# Patient Record
Sex: Female | Born: 1995 | Race: White | Hispanic: No | Marital: Single | Smoking: Never smoker
Health system: Southern US, Community
[De-identification: ages and names within clinical notes are randomized; demographics above are authoritative.]

## PROBLEM LIST (undated history)

## (undated) ENCOUNTER — Ambulatory Visit: Payer: MEDICAID

## (undated) ENCOUNTER — Ambulatory Visit

## (undated) ENCOUNTER — Encounter

## (undated) ENCOUNTER — Encounter: Attending: Medical Oncology | Primary: Medical Oncology

## (undated) ENCOUNTER — Telehealth: Attending: Psychiatry | Primary: Psychiatry

## (undated) ENCOUNTER — Telehealth

## (undated) ENCOUNTER — Encounter: Attending: Adult Health | Primary: Adult Health

## (undated) ENCOUNTER — Ambulatory Visit: Attending: Adult Health | Primary: Adult Health

## (undated) ENCOUNTER — Telehealth: Attending: Pharmacist | Primary: Pharmacist

## (undated) ENCOUNTER — Ambulatory Visit: Attending: Radiation Oncology | Primary: Radiation Oncology

## (undated) ENCOUNTER — Encounter: Attending: Psychiatry | Primary: Psychiatry

## (undated) ENCOUNTER — Telehealth: Attending: Clinical | Primary: Clinical

## (undated) ENCOUNTER — Telehealth: Attending: Adult Health | Primary: Adult Health

## (undated) ENCOUNTER — Encounter: Payer: MEDICAID | Attending: Adult Health | Primary: Adult Health

## (undated) ENCOUNTER — Telehealth
Attending: Student in an Organized Health Care Education/Training Program | Primary: Student in an Organized Health Care Education/Training Program

## (undated) ENCOUNTER — Encounter: Attending: Pharmacist | Primary: Pharmacist

## (undated) ENCOUNTER — Telehealth: Attending: Medical Oncology | Primary: Medical Oncology

## (undated) ENCOUNTER — Ambulatory Visit: Payer: MEDICAID | Attending: Adult Health | Primary: Adult Health

## (undated) ENCOUNTER — Telehealth: Attending: Pulmonary Disease | Primary: Pulmonary Disease

## (undated) ENCOUNTER — Encounter: Attending: Internal Medicine | Primary: Internal Medicine

## (undated) ENCOUNTER — Ambulatory Visit
Attending: Rehabilitative and Restorative Service Providers" | Primary: Rehabilitative and Restorative Service Providers"

## (undated) ENCOUNTER — Telehealth: Attending: Critical Care Medicine | Primary: Critical Care Medicine

## (undated) ENCOUNTER — Ambulatory Visit
Attending: Student in an Organized Health Care Education/Training Program | Primary: Student in an Organized Health Care Education/Training Program

## (undated) ENCOUNTER — Ambulatory Visit: Attending: Medical Oncology | Primary: Medical Oncology

## (undated) ENCOUNTER — Encounter: Payer: MEDICAID | Attending: Radiation Oncology | Primary: Radiation Oncology

## (undated) ENCOUNTER — Inpatient Hospital Stay

## (undated) ENCOUNTER — Encounter: Attending: Clinical | Primary: Clinical

## (undated) ENCOUNTER — Encounter
Attending: Student in an Organized Health Care Education/Training Program | Primary: Student in an Organized Health Care Education/Training Program

## (undated) ENCOUNTER — Ambulatory Visit: Attending: Obstetrics & Gynecology | Primary: Obstetrics & Gynecology

## (undated) ENCOUNTER — Encounter: Attending: Critical Care Medicine | Primary: Critical Care Medicine

## (undated) ENCOUNTER — Ambulatory Visit: Attending: Psychiatry | Primary: Psychiatry

## (undated) ENCOUNTER — Telehealth: Attending: Internal Medicine | Primary: Internal Medicine

## (undated) ENCOUNTER — Ambulatory Visit: Payer: MEDICAID | Attending: Radiation Oncology | Primary: Radiation Oncology

## (undated) ENCOUNTER — Encounter: Attending: Hematology & Oncology | Primary: Hematology & Oncology

## (undated) ENCOUNTER — Encounter: Attending: Radiation Oncology | Primary: Radiation Oncology

## (undated) ENCOUNTER — Encounter: Payer: MEDICAID | Attending: Obstetrics & Gynecology | Primary: Obstetrics & Gynecology

## (undated) ENCOUNTER — Telehealth: Attending: Radiation Oncology | Primary: Radiation Oncology

## (undated) ENCOUNTER — Telehealth: Attending: Medical | Primary: Medical

## (undated) DIAGNOSIS — F410 Panic disorder [episodic paroxysmal anxiety] without agoraphobia: Secondary | ICD-10-CM

## (undated) DIAGNOSIS — T1491XA Suicide attempt, initial encounter: Secondary | ICD-10-CM

## (undated) DIAGNOSIS — C50919 Malignant neoplasm of unspecified site of unspecified female breast: Secondary | ICD-10-CM

## (undated) HISTORY — PX: NO PAST SURGERIES: SHX2092

## (undated) HISTORY — PX: MASTECTOMY: SHX3

## (undated) HISTORY — DX: Malignant neoplasm of unspecified site of unspecified female breast: C50.919

## (undated) HISTORY — DX: Suicide attempt, initial encounter: T14.91XA

---
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
## 2014-03-11 ENCOUNTER — Ambulatory Visit: Payer: Self-pay | Admitting: Physician Assistant

## 2015-03-15 ENCOUNTER — Ambulatory Visit
Admission: EM | Admit: 2015-03-15 | Discharge: 2015-03-15 | Disposition: A | Discharge status: Home / Self Care | Payer: Self-pay | Attending: Family Medicine | Admitting: Family Medicine

## 2015-03-15 ENCOUNTER — Encounter: Payer: Self-pay | Admitting: Emergency Medicine

## 2015-03-15 DIAGNOSIS — Z113 Encounter for screening for infections with a predominantly sexual mode of transmission: Secondary | ICD-10-CM

## 2015-03-15 DIAGNOSIS — J0100 Acute maxillary sinusitis, unspecified: Secondary | ICD-10-CM

## 2015-03-15 LAB — CHLAMYDIA/NGC RT PCR (ARMC ONLY)
Chlamydia Tr: NOT DETECTED
N GONORRHOEAE: NOT DETECTED

## 2015-03-15 LAB — RAPID STREP SCREEN (MED CTR MEBANE ONLY): STREPTOCOCCUS, GROUP A SCREEN (DIRECT): NEGATIVE

## 2015-03-15 MED ORDER — AZITHROMYCIN 250 MG PO TABS: ORAL_TABLET | ORAL | Status: DC

## 2015-03-15 NOTE — ED Notes (Signed)
HIV Consent form signed by patient and sent to be scan into chart.

## 2015-03-15 NOTE — ED Notes (Signed)
Patient c/o sore throat, cough, and runny nose for 2 weeks.

## 2015-03-15 NOTE — ED Provider Notes (Addendum)
CSN: [id number]     Arrival date & time 03/15/15  1232 History   First MD Initiated Contact with Patient 03/15/15 1319     Chief Complaint  Patient presents with  . Cough   (Consider location/radiation/quality/duration/timing/severity/associated sxs/prior Treatment) HPI Comments: Patient also requesting STD screening, although she denies any symptoms.   Patient is a 20 y.o. female presenting with URI. The history is provided by the patient.  URI Presenting symptoms: congestion, cough, facial pain, fatigue and sore throat   Severity:  Moderate Onset quality:  Sudden Duration:  2 weeks Timing:  Constant Progression:  Unchanged Chronicity:  New Ineffective treatments:  OTC medications Associated symptoms: headaches, sinus pain and swollen glands     History reviewed. No pertinent past medical history. History reviewed. No pertinent past surgical history. History reviewed. No pertinent family history. Social History  Substance Use Topics  . Smoking status: Never Smoker   . Smokeless tobacco: None  . Alcohol Use: No   OB History    No data available     Review of Systems  Constitutional: Positive for fatigue.  HENT: Positive for congestion and sore throat.   Respiratory: Positive for cough.   Neurological: Positive for headaches.    Allergies  Amoxicillin  Home Medications   Prior to Admission medications   Medication Sig Start Date End Date Taking? Authorizing Provider  azithromycin (ZITHROMAX Z-PAK) 250 MG tablet 2 tabs po once day 1, then 1 tab po qd for next 4 days 03/15/15   Norval Gable, MD   Meds Ordered and Administered this Visit  Medications - No data to display  BP 142/84 mmHg  Pulse 97  Temp(Src) 98.2 F (36.8 C) (Tympanic)  Resp 16  Ht 5\' 9"  (1.753 m)  Wt 235 lb (106.595 kg)  BMI 34.69 kg/m2  SpO2 100%  LMP 02/25/2015 (Exact Date) No data found.   Physical Exam  Constitutional: She appears well-developed and well-nourished. No distress.   HENT:  Head: Normocephalic and atraumatic.  Right Ear: Tympanic membrane, external ear and ear canal normal.  Left Ear: Tympanic membrane, external ear and ear canal normal.  Nose: Mucosal edema and rhinorrhea present. No nose lacerations, sinus tenderness, nasal deformity, septal deviation or nasal septal hematoma. No epistaxis.  No foreign bodies. Right sinus exhibits maxillary sinus tenderness and frontal sinus tenderness. Left sinus exhibits maxillary sinus tenderness and frontal sinus tenderness.  Mouth/Throat: Uvula is midline, oropharynx is clear and moist and mucous membranes are normal. No oropharyngeal exudate.  Eyes: Conjunctivae and EOM are normal. Pupils are equal, round, and reactive to light. Right eye exhibits no discharge. Left eye exhibits no discharge. No scleral icterus.  Neck: Normal range of motion. Neck supple. No thyromegaly present.  Cardiovascular: Normal rate, regular rhythm and normal heart sounds.   Pulmonary/Chest: Effort normal and breath sounds normal. No respiratory distress. She has no wheezes. She has no rales.  Lymphadenopathy:    She has no cervical adenopathy.  Skin: She is not diaphoretic.  Nursing note and vitals reviewed.   ED Course  Procedures (including critical care time)  Labs Review Labs Reviewed  RAPID STREP SCREEN (NOT AT [facility])  CULTURE, GROUP A STREP ([place])  CHLAMYDIA/NGC RT PCR ([facility] ONLY)  HIV ANTIBODY (ROUTINE TESTING)  RPR    Imaging Review No results found.   Visual Acuity Review  Right Eye Distance:   Left Eye Distance:   Bilateral Distance:    Right Eye Near:   Left Eye Near:  Bilateral Near:         MDM   1. Acute maxillary sinusitis, recurrence not specified   2. Screening for STD (sexually transmitted disease)    Discharge Medication List as of 03/15/2015  1:51 PM    START taking these medications   Details  azithromycin (ZITHROMAX Z-PAK) 250 MG tablet 2 tabs po once day 1, then 1 tab po qd  for next 4 days, Normal       1. diagnosis reviewed with patient 2. rx as per orders above; reviewed possible side effects, interactions, risks and benefits  3. Recommend supportive treatment with increased fluids; otc flonase; otc analgesics prn 4. Check labs for STD screening per patient request 5.  Follow-up prn if symptoms worsen or don't improve    Norval Gable, MD 03/15/15 [place], MD 03/15/15 1544

## 2015-03-16 LAB — HIV ANTIBODY (ROUTINE TESTING W REFLEX): HIV Screen 4th Generation wRfx: NONREACTIVE

## 2015-03-16 LAB — RPR: RPR: NONREACTIVE

## 2015-03-17 LAB — CULTURE, GROUP A STREP (THRC)

## 2017-10-29 ENCOUNTER — Other Ambulatory Visit: Payer: Self-pay

## 2017-10-29 ENCOUNTER — Encounter: Payer: Self-pay | Admitting: Emergency Medicine

## 2017-10-29 ENCOUNTER — Ambulatory Visit
Admission: EM | Admit: 2017-10-29 | Discharge: 2017-10-29 | Disposition: A | Discharge status: Home / Self Care | Payer: Self-pay | Attending: Family Medicine | Admitting: Family Medicine

## 2017-10-29 DIAGNOSIS — K047 Periapical abscess without sinus: Secondary | ICD-10-CM

## 2017-10-29 HISTORY — DX: Panic disorder (episodic paroxysmal anxiety): F41.0

## 2017-10-29 MED ORDER — CLINDAMYCIN HCL 150 MG PO CAPS: 450.0000 mg | ORAL_CAPSULE | Freq: Three times a day (TID) | ORAL | 0 refills | Status: DC

## 2017-10-29 NOTE — Discharge Instructions (Signed)
Use ibuprofen 400 mg combined with Tylenol 500 mg taken every 6 hours for pain.   Apply  Cool compresses on your cheek as necessary for pain.  Apply 20 minutes out of every 2 hours 4-5 times daily.  Use salt water rinse several times daily.  Keep appointment with your dentist tomorrow.

## 2017-10-29 NOTE — ED Provider Notes (Signed)
[facility]    CSN: [id number] Arrival date & time: 10/29/17  1435     History   Chief Complaint Chief Complaint  Patient presents with  . Oral Pain    HPI Melissa [name] is a 22 y.o. female.   HPI  -year-old female presents with pain on the right side lower back molar.  She states that she has swelling of the gum.  This started about 5 days ago but has not improved.  She said no fever or chills.  Is able to open her mouth fully.  States that she had similar problem with her wisdom tooth before.  Does have an appointment with a dentist tomorrow morning.  Feels there is a flap on her gum that when she is able to lift it with her tongue almost taste like blood.  The pain also involves her ear and her jaw.  Been using 400 mg of ibuprofen 3 times daily but this does not seem to be helping the pain much.  States her mother insisted she come in today.          Past Medical History:  Diagnosis Date  . Panic disorder     There are no active problems to display for this patient.   Past Surgical History:  Procedure Laterality Date  . NO PAST SURGERIES      OB History   None      Home Medications    Prior to Admission medications   Medication Sig Start Date End Date Taking? Authorizing Provider  sertraline (ZOLOFT) 50 MG tablet Take 100 mg by mouth daily. 09/10/17  Yes [provider]  clindamycin (CLEOCIN) 150 MG capsule Take 3 capsules (450 mg total) by mouth 3 (three) times daily. 10/29/17   Lorin Picket, PA-C    Family History Family History  Problem Relation Age of Onset  . Healthy Mother   . Healthy Father     Social History Social History   Tobacco Use  . Smoking status: Never Smoker  . Smokeless tobacco: Never Used  Substance Use Topics  . Alcohol use: No  . Drug use: Never     Allergies   Amoxicillin   Review of Systems Review of Systems  Constitutional: Positive for activity change and appetite change. Negative for  chills, fatigue and fever.  HENT: Positive for dental problem.   All other systems reviewed and are negative.    Physical Exam Triage Vital Signs ED Triage Vitals  Enc Vitals Group     BP 10/29/17 1510 132/80     Pulse Rate 10/29/17 1510 80     Resp 10/29/17 1510 16     Temp 10/29/17 1510 98.4 F (36.9 C)     Temp Source 10/29/17 1510 Oral     SpO2 10/29/17 1510 99 %     Weight 10/29/17 1508 270 lb (122.5 kg)     Height 10/29/17 1508 5\' 8"  (1.727 m)     Head Circumference --      Peak Flow --      Pain Score 10/29/17 1508 4     Pain Loc --      Pain Edu? --      Excl. in [place]? --    No data found.  Updated Vital Signs BP 132/80 (BP Location: Left Arm)   Pulse 80   Temp 98.4 F (36.9 C) (Oral)   Resp 16   Ht 5\' 8"  (1.727 m)   Wt 270 lb (  122.5 kg)   LMP 10/23/2017   SpO2 99%   BMI 41.05 kg/m   Visual Acuity Right Eye Distance:   Left Eye Distance:   Bilateral Distance:    Right Eye Near:   Left Eye Near:    Bilateral Near:     Physical Exam  Constitutional: She is oriented to person, place, and time. She appears well-developed and well-nourished. No distress.  HENT:  Head: Normocephalic.  Right Ear: External ear normal.  Left Ear: External ear normal.  Nose: Nose normal.  Mouth/Throat: Oropharynx is clear and moist. No oropharyngeal exudate.    Examination of the teeth and mouth shows a partially erupted wisdom tooth on the lower right bottom.,  Although not swollen, gum is very tender to palpation.  Eyes: Pupils are equal, round, and reactive to light. Right eye exhibits no discharge. Left eye exhibits no discharge.  Neck: Normal range of motion.  Musculoskeletal: Normal range of motion.  Lymphadenopathy:    She has no cervical adenopathy.  Neurological: She is alert and oriented to person, place, and time.  Skin: Skin is warm and dry. She is not diaphoretic.  Psychiatric: She has a normal mood and affect. Her behavior is normal. Judgment and thought  content normal.  Nursing note and vitals reviewed.    UC Treatments / Results  Labs (all labs ordered are listed, but only abnormal results are displayed) Labs Reviewed - No data to display  EKG None  Radiology No results found.  Procedures Procedures (including critical care time)  Medications Ordered in UC Medications - No data to display  Initial Impression / Assessment and Plan / UC Course  I have reviewed the triage vital signs and the nursing notes.  Pertinent labs & imaging results that were available during my care of the patient were reviewed by me and considered in my medical decision making (see chart for details).     Plan: 1. Test/x-ray results and diagnosis reviewed with patient 2. rx as per orders; risks, benefits, potential side effects reviewed with patient 3. Recommend supportive treatment with compresses to the cheek as necessary for comfort and pain control.  Recommending the swishing and spitting with salt water gargles as necessary.  Because of the penicillin allergy we will start her on clindamycin.  Recommend adding Tylenol 500 mg to ibuprofen 400 mg every 6 hours as necessary for pain.  Follow-up with the dentist tomorrow 4. F/u prn if symptoms worsen or don't improve  Final Clinical Impressions(s) / UC Diagnoses   Final diagnoses:  Dental infection     Discharge Instructions     Use ibuprofen 400 mg combined with Tylenol 500 mg taken every 6 hours for pain.   Apply  Cool compresses on your cheek as necessary for pain.  Apply 20 minutes out of every 2 hours 4-5 times daily.  Use salt water rinse several times daily.  Keep appointment with your dentist tomorrow.   ED Prescriptions    Medication Sig Dispense Auth. Provider   clindamycin (CLEOCIN) 150 MG capsule Take 3 capsules (450 mg total) by mouth 3 (three) times daily. 63 capsule Lorin Picket, PA-C     Controlled Substance Prescriptions [place] Controlled Substance Registry consulted?  Not Applicable   Lorin Picket, PA-C 10/29/17 1622

## 2017-10-29 NOTE — ED Triage Notes (Signed)
Pt c/o mouth pain on the right, bottom side. Swelling of the gum. Started 5 days ago.

## 2017-12-02 ENCOUNTER — Encounter: Admit: 2017-12-02 | Discharge: 2017-12-03 | Disposition: A | Discharge status: Home / Self Care | Payer: MEDICAID

## 2017-12-02 DIAGNOSIS — N6321 Unspecified lump in the left breast, upper outer quadrant: Principal | ICD-10-CM

## 2017-12-03 ENCOUNTER — Ambulatory Visit: Admit: 2017-12-03 | Discharge: 2017-12-04

## 2017-12-03 DIAGNOSIS — N6320 Unspecified lump in the left breast, unspecified quadrant: Principal | ICD-10-CM

## 2017-12-03 DIAGNOSIS — N644 Mastodynia: Secondary | ICD-10-CM

## 2017-12-03 DIAGNOSIS — N63 Unspecified lump in unspecified breast: Principal | ICD-10-CM

## 2017-12-11 ENCOUNTER — Ambulatory Visit: Admit: 2017-12-11 | Discharge: 2017-12-12 | Attending: Medical Oncology | Primary: Medical Oncology

## 2017-12-11 ENCOUNTER — Ambulatory Visit: Admit: 2017-12-11 | Discharge: 2017-12-12

## 2017-12-11 ENCOUNTER — Ambulatory Visit: Admit: 2017-12-11 | Discharge: 2017-12-12 | Attending: MS" | Primary: MS"

## 2017-12-11 DIAGNOSIS — C50412 Malignant neoplasm of upper-outer quadrant of left female breast: Principal | ICD-10-CM

## 2017-12-11 DIAGNOSIS — C50912 Malignant neoplasm of unspecified site of left female breast: Secondary | ICD-10-CM

## 2017-12-11 DIAGNOSIS — Z171 Estrogen receptor negative status [ER-]: Secondary | ICD-10-CM

## 2017-12-11 DIAGNOSIS — Z1379 Encounter for other screening for genetic and chromosomal anomalies: Secondary | ICD-10-CM

## 2017-12-13 ENCOUNTER — Ambulatory Visit: Admit: 2017-12-13 | Discharge: 2017-12-26 | Attending: Radiation Oncology | Primary: Radiation Oncology

## 2017-12-13 DIAGNOSIS — Z171 Estrogen receptor negative status [ER-]: Secondary | ICD-10-CM

## 2017-12-13 DIAGNOSIS — C50412 Malignant neoplasm of upper-outer quadrant of left female breast: Secondary | ICD-10-CM

## 2017-12-13 DIAGNOSIS — C50912 Malignant neoplasm of unspecified site of left female breast: Principal | ICD-10-CM

## 2017-12-16 ENCOUNTER — Ambulatory Visit: Admit: 2017-12-16 | Discharge: 2017-12-29

## 2017-12-16 ENCOUNTER — Ambulatory Visit: Admit: 2017-12-16 | Discharge: 2017-12-17

## 2017-12-16 DIAGNOSIS — C50912 Malignant neoplasm of unspecified site of left female breast: Principal | ICD-10-CM

## 2017-12-18 ENCOUNTER — Ambulatory Visit: Admit: 2017-12-18 | Discharge: 2017-12-18

## 2017-12-18 DIAGNOSIS — C50912 Malignant neoplasm of unspecified site of left female breast: Principal | ICD-10-CM

## 2017-12-20 ENCOUNTER — Ambulatory Visit: Admit: 2017-12-20 | Discharge: 2017-12-20 | Attending: Medical Oncology | Primary: Medical Oncology

## 2017-12-20 ENCOUNTER — Other Ambulatory Visit: Admit: 2017-12-20 | Discharge: 2017-12-20

## 2017-12-20 DIAGNOSIS — Z171 Estrogen receptor negative status [ER-]: Secondary | ICD-10-CM

## 2017-12-20 DIAGNOSIS — C50412 Malignant neoplasm of upper-outer quadrant of left female breast: Principal | ICD-10-CM

## 2017-12-20 DIAGNOSIS — C50912 Malignant neoplasm of unspecified site of left female breast: Principal | ICD-10-CM

## 2017-12-20 MED ORDER — PROCHLORPERAZINE MALEATE 10 MG TABLET
ORAL_TABLET | Freq: Four times a day (QID) | ORAL | 2 refills | 0.00000 days | Status: CP | PRN
Start: 2017-12-20 — End: 2018-01-08
  Filled 2017-12-24: qty 30, 8d supply, fill #0

## 2017-12-20 MED ORDER — ONDANSETRON HCL 4 MG TABLET
ORAL_TABLET | Freq: Three times a day (TID) | ORAL | 2 refills | 0.00000 days | Status: CP | PRN
Start: 2017-12-20 — End: 2018-01-08
  Filled 2017-12-24: qty 30, 5d supply, fill #0

## 2017-12-20 MED ORDER — DEXAMETHASONE 4 MG TABLET
ORAL_TABLET | Freq: Every day | ORAL | 0 refills | 0 days | Status: CP
Start: 2017-12-20 — End: 2018-01-21
  Filled 2017-12-24: qty 6, 14d supply, fill #0

## 2017-12-24 ENCOUNTER — Other Ambulatory Visit: Admit: 2017-12-24 | Discharge: 2017-12-25

## 2017-12-24 ENCOUNTER — Ambulatory Visit: Admit: 2017-12-24 | Discharge: 2017-12-25

## 2017-12-24 ENCOUNTER — Ambulatory Visit: Admit: 2017-12-24 | Discharge: 2017-12-25 | Attending: Adult Health | Primary: Adult Health

## 2017-12-24 DIAGNOSIS — C50412 Malignant neoplasm of upper-outer quadrant of left female breast: Principal | ICD-10-CM

## 2017-12-24 DIAGNOSIS — Z171 Estrogen receptor negative status [ER-]: Secondary | ICD-10-CM

## 2017-12-24 MED ORDER — LIDOCAINE-PRILOCAINE 2.5 %-2.5 % TOPICAL CREAM
1 refills | 0 days | Status: SS
Start: 2017-12-24 — End: 2018-05-30

## 2017-12-24 MED FILL — ONDANSETRON HCL 4 MG TABLET: 5 days supply | Qty: 30 | Fill #0 | Status: AC

## 2017-12-24 MED FILL — DEXAMETHASONE 4 MG TABLET: 14 days supply | Qty: 6 | Fill #0 | Status: AC

## 2017-12-24 MED FILL — PROCHLORPERAZINE MALEATE 10 MG TABLET: 8 days supply | Qty: 30 | Fill #0 | Status: AC

## 2017-12-25 ENCOUNTER — Ambulatory Visit: Admit: 2017-12-25 | Discharge: 2017-12-26

## 2017-12-25 DIAGNOSIS — Z171 Estrogen receptor negative status [ER-]: Secondary | ICD-10-CM

## 2017-12-25 DIAGNOSIS — C50412 Malignant neoplasm of upper-outer quadrant of left female breast: Principal | ICD-10-CM

## 2018-01-07 ENCOUNTER — Ambulatory Visit: Admit: 2018-01-07 | Discharge: 2018-01-07 | Attending: Adult Health | Primary: Adult Health

## 2018-01-07 ENCOUNTER — Other Ambulatory Visit: Admit: 2018-01-07 | Discharge: 2018-01-07

## 2018-01-07 ENCOUNTER — Ambulatory Visit: Admit: 2018-01-07 | Discharge: 2018-01-07

## 2018-01-07 DIAGNOSIS — C50412 Malignant neoplasm of upper-outer quadrant of left female breast: Principal | ICD-10-CM

## 2018-01-07 DIAGNOSIS — Z171 Estrogen receptor negative status [ER-]: Secondary | ICD-10-CM

## 2018-01-07 MED ORDER — TRAMADOL 50 MG TABLET
ORAL_TABLET | Freq: Four times a day (QID) | ORAL | 0 refills | 0 days | Status: SS | PRN
Start: 2018-01-07 — End: 2018-05-30

## 2018-01-08 MED ORDER — PROCHLORPERAZINE MALEATE 10 MG TABLET
ORAL_TABLET | Freq: Four times a day (QID) | ORAL | 2 refills | 0.00000 days | Status: CP | PRN
Start: 2018-01-08 — End: 2018-01-21

## 2018-01-08 MED ORDER — ONDANSETRON HCL 4 MG TABLET
ORAL_TABLET | Freq: Three times a day (TID) | ORAL | 2 refills | 0 days | Status: CP | PRN
Start: 2018-01-08 — End: 2018-01-21

## 2018-01-21 ENCOUNTER — Other Ambulatory Visit: Admit: 2018-01-21 | Discharge: 2018-01-21

## 2018-01-21 ENCOUNTER — Ambulatory Visit: Admit: 2018-01-21 | Discharge: 2018-01-21

## 2018-01-21 ENCOUNTER — Ambulatory Visit: Admit: 2018-01-21 | Discharge: 2018-01-21 | Attending: Adult Health | Primary: Adult Health

## 2018-01-21 DIAGNOSIS — C50412 Malignant neoplasm of upper-outer quadrant of left female breast: Principal | ICD-10-CM

## 2018-01-21 DIAGNOSIS — Z171 Estrogen receptor negative status [ER-]: Secondary | ICD-10-CM

## 2018-01-21 MED ORDER — DEXAMETHASONE 4 MG TABLET
ORAL_TABLET | Freq: Every day | ORAL | 0 refills | 0 days | Status: CP
Start: 2018-01-21 — End: 2018-02-04
  Filled 2018-01-21: qty 12, 28d supply, fill #0

## 2018-01-21 MED ORDER — PROCHLORPERAZINE MALEATE 10 MG TABLET
ORAL_TABLET | Freq: Four times a day (QID) | ORAL | 2 refills | 0 days | Status: CP | PRN
Start: 2018-01-21 — End: 2018-02-04
  Filled 2018-01-21: qty 30, 8d supply, fill #0

## 2018-01-21 MED ORDER — ONDANSETRON HCL 4 MG TABLET
ORAL_TABLET | Freq: Three times a day (TID) | ORAL | 2 refills | 0 days | Status: CP | PRN
Start: 2018-01-21 — End: 2018-02-04
  Filled 2018-01-21: qty 30, 5d supply, fill #0

## 2018-01-21 MED FILL — ONDANSETRON HCL 4 MG TABLET: 5 days supply | Qty: 30 | Fill #0 | Status: AC

## 2018-01-21 MED FILL — DEXAMETHASONE 4 MG TABLET: 28 days supply | Qty: 12 | Fill #0 | Status: AC

## 2018-01-21 MED FILL — PROCHLORPERAZINE MALEATE 10 MG TABLET: 8 days supply | Qty: 30 | Fill #0 | Status: AC

## 2018-01-26 MED ORDER — LIDOCAINE-DIPHENHYD-AL-MAG-SIM 200 MG-25 MG-400 MG-40MG/30ML MOUTHWASH
Freq: Four times a day (QID) | OROMUCOSAL | 1 refills | 0.00000 days | Status: SS | PRN
Start: 2018-01-26 — End: 2018-05-30

## 2018-01-26 MED ORDER — FLUCONAZOLE 150 MG TABLET
ORAL_TABLET | Freq: Once | ORAL | 0 refills | 0 days | Status: CP
Start: 2018-01-26 — End: 2018-02-04

## 2018-02-04 ENCOUNTER — Ambulatory Visit: Admit: 2018-02-04 | Discharge: 2018-02-04 | Attending: Adult Health | Primary: Adult Health

## 2018-02-04 ENCOUNTER — Other Ambulatory Visit: Admit: 2018-02-04 | Discharge: 2018-02-04

## 2018-02-04 ENCOUNTER — Ambulatory Visit: Admit: 2018-02-04 | Discharge: 2018-02-04

## 2018-02-04 DIAGNOSIS — B370 Candidal stomatitis: Secondary | ICD-10-CM

## 2018-02-04 DIAGNOSIS — D509 Iron deficiency anemia, unspecified: Secondary | ICD-10-CM

## 2018-02-04 DIAGNOSIS — Z171 Estrogen receptor negative status [ER-]: Secondary | ICD-10-CM

## 2018-02-04 DIAGNOSIS — C50412 Malignant neoplasm of upper-outer quadrant of left female breast: Principal | ICD-10-CM

## 2018-02-04 DIAGNOSIS — K219 Gastro-esophageal reflux disease without esophagitis: Secondary | ICD-10-CM

## 2018-02-04 DIAGNOSIS — K921 Melena: Secondary | ICD-10-CM

## 2018-02-04 MED ORDER — NYSTATIN 100,000 UNIT/ML ORAL SUSPENSION
Freq: Four times a day (QID) | ORAL | 0 refills | 0.00000 days | Status: SS | PRN
Start: 2018-02-04 — End: 2018-05-30

## 2018-02-04 MED ORDER — PANTOPRAZOLE 40 MG TABLET,DELAYED RELEASE: 40 mg | tablet | Freq: Every day | 3 refills | 0 days | Status: AC

## 2018-02-04 MED ORDER — PROCHLORPERAZINE MALEATE 10 MG TABLET
ORAL_TABLET | Freq: Four times a day (QID) | ORAL | 2 refills | 0 days | Status: SS | PRN
Start: 2018-02-04 — End: 2018-05-30

## 2018-02-04 MED ORDER — LORAZEPAM 0.5 MG TABLET
ORAL_TABLET | 0 refills | 0 days | Status: CP
Start: 2018-02-04 — End: 2018-10-07
  Filled 2018-02-04: qty 30, 7d supply, fill #0

## 2018-02-04 MED ORDER — DEXAMETHASONE 4 MG TABLET
ORAL_TABLET | Freq: Every day | ORAL | 0 refills | 0 days | Status: SS
Start: 2018-02-04 — End: 2018-05-30

## 2018-02-04 MED ORDER — FLUCONAZOLE 150 MG TABLET
ORAL_TABLET | Freq: Once | ORAL | 0 refills | 0 days | Status: CP
Start: 2018-02-04 — End: 2018-02-05
  Filled 2018-02-04: qty 2, 3d supply, fill #0

## 2018-02-04 MED ORDER — ONDANSETRON HCL 8 MG TABLET
ORAL_TABLET | 2 refills | 0 days | Status: SS
Start: 2018-02-04 — End: 2018-05-30

## 2018-02-04 MED ORDER — PANTOPRAZOLE 40 MG TABLET,DELAYED RELEASE
ORAL_TABLET | Freq: Every day | ORAL | 3 refills | 0.00000 days | Status: CP
Start: 2018-02-04 — End: 2018-09-17
  Filled 2018-02-04: qty 30, 30d supply, fill #0

## 2018-02-04 MED FILL — ONDANSETRON HCL 8 MG TABLET: 10 days supply | Qty: 30 | Fill #0

## 2018-02-04 MED FILL — PANTOPRAZOLE 40 MG TABLET,DELAYED RELEASE: 30 days supply | Qty: 30 | Fill #0 | Status: AC

## 2018-02-04 MED FILL — PROCHLORPERAZINE MALEATE 10 MG TABLET: ORAL | 8 days supply | Qty: 30 | Fill #0

## 2018-02-04 MED FILL — ONDANSETRON HCL 8 MG TABLET: 10 days supply | Qty: 30 | Fill #0 | Status: AC

## 2018-02-04 MED FILL — LORAZEPAM 0.5 MG TABLET: 7 days supply | Qty: 30 | Fill #0 | Status: AC

## 2018-02-04 MED FILL — FLUCONAZOLE 150 MG TABLET: 3 days supply | Qty: 2 | Fill #0 | Status: AC

## 2018-02-04 MED FILL — NYSTATIN 100,000 UNIT/ML ORAL SUSPENSION: 3 days supply | Qty: 60 | Fill #0 | Status: AC

## 2018-02-04 MED FILL — NYSTATIN 100,000 UNIT/ML ORAL SUSPENSION: ORAL | 3 days supply | Qty: 60 | Fill #0

## 2018-02-04 MED FILL — PROCHLORPERAZINE MALEATE 10 MG TABLET: 8 days supply | Qty: 30 | Fill #0 | Status: AC

## 2018-02-04 MED FILL — DEXAMETHASONE 4 MG TABLET: 2 days supply | Qty: 4 | Fill #0 | Status: AC

## 2018-02-04 MED FILL — DEXAMETHASONE 4 MG TABLET: ORAL | 2 days supply | Qty: 4 | Fill #0

## 2018-02-06 ENCOUNTER — Ambulatory Visit: Admit: 2018-02-06 | Discharge: 2018-02-07 | Attending: Psychiatry | Primary: Psychiatry

## 2018-02-06 DIAGNOSIS — F4310 Post-traumatic stress disorder, unspecified: Principal | ICD-10-CM

## 2018-02-06 DIAGNOSIS — F329 Major depressive disorder, single episode, unspecified: Secondary | ICD-10-CM

## 2018-02-06 DIAGNOSIS — F419 Anxiety disorder, unspecified: Secondary | ICD-10-CM

## 2018-02-06 MED ORDER — SERTRALINE 100 MG TABLET
ORAL_TABLET | Freq: Every day | ORAL | 1 refills | 0.00000 days | Status: CP
Start: 2018-02-06 — End: 2018-05-26

## 2018-02-17 ENCOUNTER — Institutional Professional Consult (permissible substitution): Admit: 2018-02-17 | Discharge: 2018-02-18

## 2018-02-17 DIAGNOSIS — Z171 Estrogen receptor negative status [ER-]: Secondary | ICD-10-CM

## 2018-02-17 DIAGNOSIS — C50412 Malignant neoplasm of upper-outer quadrant of left female breast: Principal | ICD-10-CM

## 2018-02-25 ENCOUNTER — Other Ambulatory Visit: Admit: 2018-02-25 | Discharge: 2018-02-25

## 2018-02-25 ENCOUNTER — Ambulatory Visit: Admit: 2018-02-25 | Discharge: 2018-02-25

## 2018-02-25 ENCOUNTER — Ambulatory Visit: Admit: 2018-02-25 | Discharge: 2018-02-25 | Attending: Adult Health | Primary: Adult Health

## 2018-02-25 DIAGNOSIS — C50412 Malignant neoplasm of upper-outer quadrant of left female breast: Principal | ICD-10-CM

## 2018-02-25 DIAGNOSIS — Z171 Estrogen receptor negative status [ER-]: Secondary | ICD-10-CM

## 2018-03-04 ENCOUNTER — Other Ambulatory Visit: Admit: 2018-03-04 | Discharge: 2018-03-05

## 2018-03-04 ENCOUNTER — Ambulatory Visit: Admit: 2018-03-04 | Discharge: 2018-03-05

## 2018-03-04 DIAGNOSIS — C50412 Malignant neoplasm of upper-outer quadrant of left female breast: Principal | ICD-10-CM

## 2018-03-04 DIAGNOSIS — Z171 Estrogen receptor negative status [ER-]: Secondary | ICD-10-CM

## 2018-03-11 ENCOUNTER — Other Ambulatory Visit: Admit: 2018-03-11 | Discharge: 2018-03-12

## 2018-03-11 ENCOUNTER — Ambulatory Visit: Admit: 2018-03-11 | Discharge: 2018-03-12

## 2018-03-11 DIAGNOSIS — C50412 Malignant neoplasm of upper-outer quadrant of left female breast: Principal | ICD-10-CM

## 2018-03-11 DIAGNOSIS — Z171 Estrogen receptor negative status [ER-]: Secondary | ICD-10-CM

## 2018-03-18 ENCOUNTER — Ambulatory Visit: Admit: 2018-03-18 | Discharge: 2018-03-18 | Attending: Adult Health | Primary: Adult Health

## 2018-03-18 ENCOUNTER — Ambulatory Visit: Admit: 2018-03-18 | Discharge: 2018-03-18

## 2018-03-18 ENCOUNTER — Other Ambulatory Visit: Admit: 2018-03-18 | Discharge: 2018-03-18

## 2018-03-18 DIAGNOSIS — R21 Rash and other nonspecific skin eruption: Secondary | ICD-10-CM

## 2018-03-18 DIAGNOSIS — C50412 Malignant neoplasm of upper-outer quadrant of left female breast: Principal | ICD-10-CM

## 2018-03-18 DIAGNOSIS — Z171 Estrogen receptor negative status [ER-]: Secondary | ICD-10-CM

## 2018-03-18 MED ORDER — TRIAMCINOLONE ACETONIDE 0.1 % TOPICAL CREAM
Freq: Two times a day (BID) | TOPICAL | 0 refills | 0 days | Status: SS
Start: 2018-03-18 — End: 2018-05-30

## 2018-03-18 MED FILL — TRIAMCINOLONE ACETONIDE 0.1 % TOPICAL CREAM: TOPICAL | 30 days supply | Qty: 45 | Fill #0

## 2018-03-18 MED FILL — TRIAMCINOLONE ACETONIDE 0.1 % TOPICAL CREAM: 30 days supply | Qty: 45 | Fill #0 | Status: AC

## 2018-03-25 ENCOUNTER — Ambulatory Visit: Admit: 2018-03-25 | Discharge: 2018-03-26

## 2018-03-25 DIAGNOSIS — C50412 Malignant neoplasm of upper-outer quadrant of left female breast: Principal | ICD-10-CM

## 2018-03-25 DIAGNOSIS — Z171 Estrogen receptor negative status [ER-]: Secondary | ICD-10-CM

## 2018-04-01 ENCOUNTER — Ambulatory Visit: Admit: 2018-04-01 | Discharge: 2018-04-02

## 2018-04-01 DIAGNOSIS — Z171 Estrogen receptor negative status [ER-]: Secondary | ICD-10-CM

## 2018-04-01 DIAGNOSIS — C50412 Malignant neoplasm of upper-outer quadrant of left female breast: Principal | ICD-10-CM

## 2018-04-08 ENCOUNTER — Ambulatory Visit: Admit: 2018-04-08 | Discharge: 2018-04-08

## 2018-04-08 ENCOUNTER — Ambulatory Visit: Admit: 2018-04-08 | Discharge: 2018-04-08 | Attending: Adult Health | Primary: Adult Health

## 2018-04-08 ENCOUNTER — Other Ambulatory Visit: Admit: 2018-04-08 | Discharge: 2018-04-08

## 2018-04-08 DIAGNOSIS — Z171 Estrogen receptor negative status [ER-]: Secondary | ICD-10-CM

## 2018-04-08 DIAGNOSIS — C50412 Malignant neoplasm of upper-outer quadrant of left female breast: Principal | ICD-10-CM

## 2018-04-08 DIAGNOSIS — G893 Neoplasm related pain (acute) (chronic): Secondary | ICD-10-CM

## 2018-04-08 MED ORDER — IBUPROFEN 800 MG TABLET
ORAL_TABLET | Freq: Three times a day (TID) | ORAL | 2 refills | 24 days | Status: CP | PRN
Start: 2018-04-08 — End: 2019-04-08
  Filled 2018-04-08: qty 70, 23d supply, fill #0

## 2018-04-08 MED FILL — IBUPROFEN 800 MG TABLET: 23 days supply | Qty: 70 | Fill #0 | Status: AC

## 2018-04-14 ENCOUNTER — Ambulatory Visit: Admit: 2018-04-14 | Discharge: 2018-04-14

## 2018-04-14 DIAGNOSIS — C50412 Malignant neoplasm of upper-outer quadrant of left female breast: Principal | ICD-10-CM

## 2018-04-14 DIAGNOSIS — Z171 Estrogen receptor negative status [ER-]: Secondary | ICD-10-CM

## 2018-04-15 ENCOUNTER — Ambulatory Visit: Admit: 2018-04-15 | Discharge: 2018-04-16

## 2018-04-15 DIAGNOSIS — Z171 Estrogen receptor negative status [ER-]: Secondary | ICD-10-CM

## 2018-04-15 DIAGNOSIS — D649 Anemia, unspecified: Secondary | ICD-10-CM

## 2018-04-15 DIAGNOSIS — C50412 Malignant neoplasm of upper-outer quadrant of left female breast: Principal | ICD-10-CM

## 2018-04-16 ENCOUNTER — Ambulatory Visit: Admit: 2018-04-16 | Discharge: 2018-04-17

## 2018-04-16 DIAGNOSIS — D649 Anemia, unspecified: Principal | ICD-10-CM

## 2018-04-16 DIAGNOSIS — C50412 Malignant neoplasm of upper-outer quadrant of left female breast: Secondary | ICD-10-CM

## 2018-04-16 DIAGNOSIS — Z171 Estrogen receptor negative status [ER-]: Secondary | ICD-10-CM

## 2018-04-21 MED ORDER — OXYCODONE 5 MG TABLET
ORAL_TABLET | ORAL | 0 refills | 0.00000 days | Status: SS | PRN
Start: 2018-04-21 — End: 2018-05-30

## 2018-04-22 ENCOUNTER — Ambulatory Visit: Admit: 2018-04-22 | Discharge: 2018-04-22

## 2018-04-22 ENCOUNTER — Ambulatory Visit: Admit: 2018-04-22 | Discharge: 2018-04-22 | Attending: Adult Health | Primary: Adult Health

## 2018-04-22 DIAGNOSIS — Z171 Estrogen receptor negative status [ER-]: Secondary | ICD-10-CM

## 2018-04-22 DIAGNOSIS — R300 Dysuria: Secondary | ICD-10-CM

## 2018-04-22 DIAGNOSIS — C50412 Malignant neoplasm of upper-outer quadrant of left female breast: Principal | ICD-10-CM

## 2018-04-22 DIAGNOSIS — N9089 Other specified noninflammatory disorders of vulva and perineum: Secondary | ICD-10-CM

## 2018-04-22 DIAGNOSIS — R350 Frequency of micturition: Secondary | ICD-10-CM

## 2018-04-29 ENCOUNTER — Other Ambulatory Visit: Admit: 2018-04-29 | Discharge: 2018-04-29

## 2018-04-29 ENCOUNTER — Ambulatory Visit: Admit: 2018-04-29 | Discharge: 2018-04-29 | Attending: Medical Oncology | Primary: Medical Oncology

## 2018-04-29 ENCOUNTER — Ambulatory Visit: Admit: 2018-04-29 | Discharge: 2018-04-29

## 2018-04-29 DIAGNOSIS — Z171 Estrogen receptor negative status [ER-]: Secondary | ICD-10-CM

## 2018-04-29 DIAGNOSIS — R5383 Other fatigue: Principal | ICD-10-CM

## 2018-04-29 DIAGNOSIS — R110 Nausea: Principal | ICD-10-CM

## 2018-04-29 DIAGNOSIS — T451X5D Adverse effect of antineoplastic and immunosuppressive drugs, subsequent encounter: Principal | ICD-10-CM

## 2018-04-29 DIAGNOSIS — Z5111 Encounter for antineoplastic chemotherapy: Principal | ICD-10-CM

## 2018-04-29 DIAGNOSIS — R51 Headache: Principal | ICD-10-CM

## 2018-04-29 DIAGNOSIS — Z170 Estrogen receptor positive status [ER+]: Principal | ICD-10-CM

## 2018-04-29 DIAGNOSIS — C50412 Malignant neoplasm of upper-outer quadrant of left female breast: Principal | ICD-10-CM

## 2018-04-29 DIAGNOSIS — Z808 Family history of malignant neoplasm of other organs or systems: Principal | ICD-10-CM

## 2018-04-29 DIAGNOSIS — K1230 Oral mucositis (ulcerative), unspecified: Principal | ICD-10-CM

## 2018-04-29 DIAGNOSIS — K0889 Other specified disorders of teeth and supporting structures: Principal | ICD-10-CM

## 2018-04-29 DIAGNOSIS — M898X9 Other specified disorders of bone, unspecified site: Principal | ICD-10-CM

## 2018-04-29 DIAGNOSIS — F419 Anxiety disorder, unspecified: Principal | ICD-10-CM

## 2018-04-29 DIAGNOSIS — Z79899 Other long term (current) drug therapy: Principal | ICD-10-CM

## 2018-04-29 DIAGNOSIS — G620 Drug-induced polyneuropathy: Principal | ICD-10-CM

## 2018-04-29 DIAGNOSIS — D509 Iron deficiency anemia, unspecified: Principal | ICD-10-CM

## 2018-04-29 DIAGNOSIS — N644 Mastodynia: Principal | ICD-10-CM

## 2018-04-29 DIAGNOSIS — F329 Major depressive disorder, single episode, unspecified: Principal | ICD-10-CM

## 2018-04-29 DIAGNOSIS — K921 Melena: Principal | ICD-10-CM

## 2018-05-01 ENCOUNTER — Ambulatory Visit: Admit: 2018-05-01 | Discharge: 2018-05-02

## 2018-05-01 DIAGNOSIS — Z171 Estrogen receptor negative status [ER-]: Secondary | ICD-10-CM

## 2018-05-01 DIAGNOSIS — C50412 Malignant neoplasm of upper-outer quadrant of left female breast: Principal | ICD-10-CM

## 2018-05-01 DIAGNOSIS — F410 Panic disorder [episodic paroxysmal anxiety] without agoraphobia: Principal | ICD-10-CM

## 2018-05-01 DIAGNOSIS — F329 Major depressive disorder, single episode, unspecified: Principal | ICD-10-CM

## 2018-05-01 DIAGNOSIS — F4310 Post-traumatic stress disorder, unspecified: Principal | ICD-10-CM

## 2018-05-01 DIAGNOSIS — F419 Anxiety disorder, unspecified: Principal | ICD-10-CM

## 2018-05-06 ENCOUNTER — Other Ambulatory Visit: Admit: 2018-05-06 | Discharge: 2018-05-07

## 2018-05-06 ENCOUNTER — Ambulatory Visit: Admit: 2018-05-06 | Discharge: 2018-05-07

## 2018-05-06 DIAGNOSIS — C50412 Malignant neoplasm of upper-outer quadrant of left female breast: Principal | ICD-10-CM

## 2018-05-06 DIAGNOSIS — Z171 Estrogen receptor negative status [ER-]: Principal | ICD-10-CM

## 2018-05-06 DIAGNOSIS — Z5111 Encounter for antineoplastic chemotherapy: Principal | ICD-10-CM

## 2018-05-13 ENCOUNTER — Ambulatory Visit: Admit: 2018-05-13 | Discharge: 2018-05-14

## 2018-05-13 ENCOUNTER — Other Ambulatory Visit: Admit: 2018-05-13 | Discharge: 2018-05-14

## 2018-05-13 ENCOUNTER — Institutional Professional Consult (permissible substitution): Admit: 2018-05-13 | Discharge: 2018-05-14

## 2018-05-13 DIAGNOSIS — R928 Other abnormal and inconclusive findings on diagnostic imaging of breast: Principal | ICD-10-CM

## 2018-05-13 DIAGNOSIS — Z171 Estrogen receptor negative status [ER-]: Principal | ICD-10-CM

## 2018-05-13 DIAGNOSIS — C50412 Malignant neoplasm of upper-outer quadrant of left female breast: Principal | ICD-10-CM

## 2018-05-13 DIAGNOSIS — Z5111 Encounter for antineoplastic chemotherapy: Principal | ICD-10-CM

## 2018-05-13 DIAGNOSIS — F329 Major depressive disorder, single episode, unspecified: Principal | ICD-10-CM

## 2018-05-13 DIAGNOSIS — F4310 Post-traumatic stress disorder, unspecified: Principal | ICD-10-CM

## 2018-05-13 DIAGNOSIS — F419 Anxiety disorder, unspecified: Principal | ICD-10-CM

## 2018-05-13 DIAGNOSIS — F410 Panic disorder [episodic paroxysmal anxiety] without agoraphobia: Principal | ICD-10-CM

## 2018-05-13 DIAGNOSIS — M79622 Pain in left upper arm: Principal | ICD-10-CM

## 2018-05-15 ENCOUNTER — Ambulatory Visit: Admit: 2018-05-15 | Discharge: 2018-05-16

## 2018-05-15 DIAGNOSIS — C50412 Malignant neoplasm of upper-outer quadrant of left female breast: Principal | ICD-10-CM

## 2018-05-15 DIAGNOSIS — F4310 Post-traumatic stress disorder, unspecified: Principal | ICD-10-CM

## 2018-05-15 DIAGNOSIS — F419 Anxiety disorder, unspecified: Principal | ICD-10-CM

## 2018-05-15 DIAGNOSIS — F410 Panic disorder [episodic paroxysmal anxiety] without agoraphobia: Principal | ICD-10-CM

## 2018-05-15 DIAGNOSIS — F329 Major depressive disorder, single episode, unspecified: Principal | ICD-10-CM

## 2018-05-15 DIAGNOSIS — Z171 Estrogen receptor negative status [ER-]: Principal | ICD-10-CM

## 2018-05-15 DIAGNOSIS — C50912 Malignant neoplasm of unspecified site of left female breast: Principal | ICD-10-CM

## 2018-05-19 ENCOUNTER — Ambulatory Visit: Admit: 2018-05-19 | Discharge: 2018-05-19

## 2018-05-19 DIAGNOSIS — N644 Mastodynia: Principal | ICD-10-CM

## 2018-05-19 DIAGNOSIS — F329 Major depressive disorder, single episode, unspecified: Principal | ICD-10-CM

## 2018-05-19 DIAGNOSIS — F4310 Post-traumatic stress disorder, unspecified: Principal | ICD-10-CM

## 2018-05-19 DIAGNOSIS — Z171 Estrogen receptor negative status [ER-]: Principal | ICD-10-CM

## 2018-05-19 DIAGNOSIS — R590 Localized enlarged lymph nodes: Principal | ICD-10-CM

## 2018-05-19 DIAGNOSIS — C50412 Malignant neoplasm of upper-outer quadrant of left female breast: Principal | ICD-10-CM

## 2018-05-19 DIAGNOSIS — Z808 Family history of malignant neoplasm of other organs or systems: Principal | ICD-10-CM

## 2018-05-19 DIAGNOSIS — G629 Polyneuropathy, unspecified: Principal | ICD-10-CM

## 2018-05-19 DIAGNOSIS — F419 Anxiety disorder, unspecified: Principal | ICD-10-CM

## 2018-05-19 DIAGNOSIS — F410 Panic disorder [episodic paroxysmal anxiety] without agoraphobia: Principal | ICD-10-CM

## 2018-05-19 DIAGNOSIS — Z9221 Personal history of antineoplastic chemotherapy: Principal | ICD-10-CM

## 2018-05-19 DIAGNOSIS — Z170 Estrogen receptor positive status [ER+]: Principal | ICD-10-CM

## 2018-05-19 DIAGNOSIS — Z880 Allergy status to penicillin: Principal | ICD-10-CM

## 2018-05-26 DIAGNOSIS — F4310 Post-traumatic stress disorder, unspecified: Principal | ICD-10-CM

## 2018-05-27 MED ORDER — SERTRALINE 100 MG TABLET
ORAL_TABLET | Freq: Every day | ORAL | 1 refills | 0 days | Status: CP
Start: 2018-05-27 — End: 2018-09-27

## 2018-05-29 ENCOUNTER — Ambulatory Visit: Admit: 2018-05-29 | Discharge: 2018-05-30

## 2018-05-29 DIAGNOSIS — C50412 Malignant neoplasm of upper-outer quadrant of left female breast: Principal | ICD-10-CM

## 2018-05-29 DIAGNOSIS — Z171 Estrogen receptor negative status [ER-]: Secondary | ICD-10-CM

## 2018-05-30 ENCOUNTER — Encounter: Admit: 2018-05-30 | Discharge: 2018-05-31 | Attending: Anesthesiology | Primary: Anesthesiology

## 2018-05-30 ENCOUNTER — Ambulatory Visit: Admit: 2018-05-30 | Discharge: 2018-05-31

## 2018-05-30 DIAGNOSIS — C50412 Malignant neoplasm of upper-outer quadrant of left female breast: Principal | ICD-10-CM

## 2018-05-30 MED ORDER — SULFAMETHOXAZOLE 800 MG-TRIMETHOPRIM 160 MG TABLET
ORAL_TABLET | Freq: Two times a day (BID) | ORAL | 0 refills | 0.00000 days | Status: CP
Start: 2018-05-30 — End: 2018-06-12
  Filled 2018-05-30: qty 12, 6d supply, fill #0

## 2018-05-30 MED ORDER — DOCUSATE SODIUM 100 MG CAPSULE
ORAL_CAPSULE | Freq: Every day | ORAL | 0 refills | 0 days | Status: CP
Start: 2018-05-30 — End: 2018-06-12
  Filled 2018-05-30: qty 30, 30d supply, fill #0

## 2018-05-30 MED ORDER — ONDANSETRON 4 MG DISINTEGRATING TABLET
ORAL_TABLET | Freq: Three times a day (TID) | ORAL | 0 refills | 0 days | Status: CP | PRN
Start: 2018-05-30 — End: 2018-06-19
  Filled 2018-05-30: qty 10, 4d supply, fill #0

## 2018-05-30 MED ORDER — OXYCODONE 5 MG TABLET
ORAL_TABLET | Freq: Four times a day (QID) | ORAL | 0 refills | 0.00000 days | Status: CP | PRN
Start: 2018-05-30 — End: 2018-06-09
  Filled 2018-05-30: qty 10, 3d supply, fill #0

## 2018-05-30 MED FILL — SULFAMETHOXAZOLE 800 MG-TRIMETHOPRIM 160 MG TABLET: 6 days supply | Qty: 12 | Fill #0 | Status: AC

## 2018-05-30 MED FILL — OXYCODONE 5 MG TABLET: 3 days supply | Qty: 10 | Fill #0 | Status: AC

## 2018-05-30 MED FILL — DOCUSATE SODIUM 100 MG CAPSULE: 30 days supply | Qty: 30 | Fill #0 | Status: AC

## 2018-05-30 MED FILL — ONDANSETRON 4 MG DISINTEGRATING TABLET: 4 days supply | Qty: 10 | Fill #0 | Status: AC

## 2018-06-05 ENCOUNTER — Ambulatory Visit: Admit: 2018-06-05 | Discharge: 2018-06-06

## 2018-06-05 DIAGNOSIS — Z9882 Breast implant status: Principal | ICD-10-CM

## 2018-06-09 ENCOUNTER — Ambulatory Visit: Admit: 2018-06-09 | Discharge: 2018-06-09

## 2018-06-09 DIAGNOSIS — C50412 Malignant neoplasm of upper-outer quadrant of left female breast: Principal | ICD-10-CM

## 2018-06-09 DIAGNOSIS — Z171 Estrogen receptor negative status [ER-]: Secondary | ICD-10-CM

## 2018-06-09 MED ORDER — OXYCODONE 5 MG TABLET
ORAL_TABLET | Freq: Four times a day (QID) | ORAL | 0 refills | 0.00000 days | Status: CP | PRN
Start: 2018-06-09 — End: 2018-06-19

## 2018-06-10 ENCOUNTER — Institutional Professional Consult (permissible substitution): Admit: 2018-06-10 | Discharge: 2018-06-11 | Attending: Medical Oncology | Primary: Medical Oncology

## 2018-06-10 DIAGNOSIS — Z171 Estrogen receptor negative status [ER-]: Secondary | ICD-10-CM

## 2018-06-10 DIAGNOSIS — C50412 Malignant neoplasm of upper-outer quadrant of left female breast: Principal | ICD-10-CM

## 2018-06-10 MED ORDER — CAPECITABINE 500 MG TABLET
ORAL_TABLET | Freq: Two times a day (BID) | ORAL | 8 refills | 0.00000 days | Status: CP
Start: 2018-06-10 — End: ?
  Filled 2018-06-11: qty 84, 14d supply, fill #0

## 2018-06-10 NOTE — Unmapped (Signed)
Medical Oncology Return Patient Visit -- PHONE    Patient Name: Sheri [name]  Patient Age: 23 y.o.  Encounter Date: 06/10/2018    Referring Physician:   Referred Self  No address on file    PCP: Nicole Kindred, PA    Oncology Treatment Team:   Medical Oncologist: Dr. Marc Morgans  Surgical Oncologist: Dr. [name]   Radiation Oncologist: Dr. Zacarias Pontes     Reason for Visit:  23 y.o. female phoned for follow-up for (L) breast cancer   I spent 10 minutes on the phone with the patient. I spent an additional 10 minutes on pre- and post-visit activities.     The patient was physically located in [place] or a state in which I am permitted to provide care. The patient understood that s/he may incur co-pays and cost sharing, and agreed to the telemedicine visit. The visit was completed via phone and/or video, which was appropriate and reasonable under the circumstances given the patient's presentation at the time.    The patient has been advised of the potential risks and limitations of this mode of treatment (including, but not limited to, the absence of in-person examination) and has agreed to be treated using telemedicine. The patient's/patient's family's questions regarding telemedicine have been answered.     If the phone/video visit was completed in an ambulatory setting, the patient has also been advised to contact their provider???s office for worsening conditions, and seek emergency medical treatment and/or call 911 if the patient deems either necessary.        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  Plan for neoadjuvant chemo with dose-dense AC x 4, followed by weekly Taxol x 12 (with every 3 week Carbo). Will then require breast surgery, followed by radiation, followed by endocrine therapy (given 5% ER positivity).   --Staging evaluation CT chest/abd/pelvis + bone scan 12/16/17 negative for distant metastatic disease.  CT chest with enlarged (L) axillary LNs concerning for nodal mets. Results reviewed in detail with patient.   --Intent of therapy: Curative; pt aware.    05/30/2018- nipple sparing mastectomy and SLN with Path as below shows ypT1b ypN0 RCB II    Systemic therapy:    --Port-a-cath placed 12/18/17.  Pre-chemo ECHO 12/16/17 with EF >55%.  Cycle #4 AC dose-reduced 20% d/t side effects; also allowed for 1 extra week of recovery before returning to start Taxol/Carbo.  Taxol and Carbo also had to be dose reduced   Today we have talked about adjuvant xeloda a la the CREATE-X study.   I have sent a prescription and will ask Aimee [place] to call with chemo teaching.  RTC in 4 weeks to see me or Gretchen with labs.  Discussed importance of contraception.    We can talk at the end of 6 months xeloda about whether she should take 5 years of tamoxifen for ER5%     [maintained for future visits/continuity of care - below not addressed at today's visit]       Genetics:   --Met with [facility] team on 12/11/17; results negative.       Fertility/Contraception:   --Interested in fertility preservation, but has no insurance so we are planning monthly Lupron injections to try to preserve ovarian function as much as possible. Suspect her ovarian reserve at 23 years old will  help maintain fertility post-chemo.   1st dose Lupron 12/20/17. Last Lupron 03/18/18. Last dose will be in March.  No breakthrough vaginal bleeding since starting Lupron; did have some suprapubic cramping, but no bleeding. She has been advised to let us know if she has any spotting/bleeding.    --Is not married, but does have a boyfriend.  For contraception, has been referred to gynecology for placement of non-hormonal IUD. In the meantime, stressed importance of barrier method of birth control during chemotherapy. Discussed dangers of pregnancy during chemotherapy.     DISPO:   4 weeks ----------------------------------------------------------  Interval History/ROS:   Ms. Ireland 23 y.o.     Completed neoadjuvant therapy and underwent nipple sparing mastectomy and SLN on 4/3.   Path as below shows ypT1b ypN0 RCB II   Doing well. Healing up well    ECOG Performance Status: 1 - Symptomatic; remains independent         History of Present Illness (from initial consult):     Sheri [name] is a 23 y.o. female who is seen in consultation at the request of Self, Referred for an evaluation of breast cancer    This is a very young 23 year old woman with a 10th grade education and anxiety / panic d/o who is here today with her parents and a female friend.  She and I have had a very frank, discussion and she has been able to articulate what she needs in terms of communication style.   She first felt a breast lump 3 weeks ago Onc hx as below. Newly dx clinical T3N0  Essentially TNBC ( ER 5%) genetics pending.   On sx review she has recently gained 50 lbs. She is fatigued. She is under the care of psychiatrist at [facility].   I have talked to her about child bearing hopes and about current methods of contraception. She says she has never given much thought to contraception and does not know if she is pregnant or not.  She is interested in fertility preservation but is uninsured.       Oncology History:    Oncology History    Left breast cT2 N0 IDC, G3, weakly ER+, PR-, HER-        Malignant neoplasm of upper-outer quadrant of left breast in female, estrogen receptor negative (CMS-HCC)    12/03/2017 -  Presenting Symptoms     Presented with self detected left breast mass x 3 weeks.  MMG/US: Left breast irregular 2.7 x 3.2 cm mass. There are faint microcalcifications associated with the mass and mild surrounding trabecular thickening is suggestive of edema.  There are 2 hyperdense and enlarged left axillary lymph nodes. No other suspicious findings are seen in the left breast.      12/03/2017 -  Other     Clinic exam: Left breast in the 1 to 2 o'clock position there is a 4 cm irregular mass. Subtle pink erythema without pitting. Left axilla with a palpable 2 cm lymph node.      12/03/2017 Biopsy     Left breast USG core biopsy: IDC, G3, ER+(5%), PR-(0), HER2-(0)  Left axillary USG core biopsy: fragments of LN negative for carcinoma      12/10/2017 Initial Diagnosis     Malignant neoplasm of upper-outer quadrant of left breast in female, estrogen receptor negative (CMS-HCC)      12/10/2017 Tumor Board     MDC recs: Left breast cT2 N0 IDC, G3, +/-/-. Very suspicious LN on imaging  and exam. LN core biopsy negative. Discussed considering repeat core (although needle is definitively in the node) vs outback SLN. Will receive NACT. If node neg, plan for outback SLN. Node positivity would affect radiation recs if patient has mastectomy. Meeting genetics today. Will discuss fertility. Also discussed concerns for XRT in young patient in 20's - higher risk of complications from XRT long-term. Consider staging studies.      12/10/2017 -  Cancer Staged     Staging form: Breast, AJCC 8th Edition  - Clinical stage from 12/10/2017: Stage IIB (cT2, cN0(f), cM0, G3, ER+, PR-, HER2-) - Signed by Talbert Cage, DO on 12/10/2017        12/11/2017 Biopsy     Repeat left axillary core biopsy given clinical suspicion: LN negative for carcinoma      12/24/2017 - 02/17/2018 Chemotherapy     OP BREAST AC (DOSE DENSE)  DOXOrubicin 60 mg/m2, Cyclophosphamide 600 mg/m2 every 14 days      01/08/2018 Genetics     Patient has genetic testing done for breast cancer.  Negative [employer] Breast Cancer Guidelines-Based Panel.      02/25/2018 -  Chemotherapy     OP BREAST PACLItaxel WEEKLY / CARBOplatin EVERY 3 WEEKS  PACLItaxel 80 mg/m2 IV weekly   CARBOplatin AUC 6 IV every 3 weeks   21-day cycle x 4 cycles       05/30/2018 Surgery     Left SSM: Residual IDC, Gr 2 +/-/- measuring 8mm.  Final margins are clear.  0/4 SLN with metastatic disease  Residual Cancer Burden   Tumor bed size: 8 mm x 7 mm  Overall tumor cellularity: 40%  Percentage in situ: 0%  Number of involved lymph nodes: 0  Diameter of largest metastasis: 0 mm  Residual Cancer Burden Score: 1.687  Residual Cancer Burden Class: RCB-II      06/09/2018 -  Cancer Staged     Staging form: Breast, AJCC 8th Edition  - Pathologic stage from 06/09/2018: No Stage Recommended (ypT1b, pN0(sn), cM0, G2, ER+, PR-, HER2-) - Signed by Rudie Meyer, ANP on 06/09/2018           Past Medical History:   Diagnosis Date   ??? Anxiety    ??? Depression    ??? Malignant neoplasm of upper-outer quadrant of left breast in female, estrogen receptor negative (CMS-HCC) 12/10/2017   ??? Panic disorder    ??? PTSD (post-traumatic stress disorder)       Does endorse a h/o panic d/o  Past Surgical History:   Procedure Laterality Date   ??? BREAST BIOPSY Left 2019    malignant   ??? CHEMOTHERAPY Left 11/2017    May 13 2018   ??? IR INSERT PORT AGE GREATER THAN 5 YRS  12/18/2017    IR INSERT PORT AGE GREATER THAN 5 YRS 12/18/2017 [name], MD IMG [facility]IR [facility]   ??? PR BREAST RECONSTRUC W TISS [facility] Left 05/30/2018    Procedure: BREAST RECON IMMED/DELAY W/EXPANDR Shari Heritage EXPA;  Surgeon: Arsenio Katz, MD;  Location: ASC OR [facility];  Service: Plastics   ??? PR BX/REMV,LYMPH NODE,DEEP AXILL Left 05/30/2018    Procedure: BX/EXC LYMPH NODE; OPEN, DEEP AXILRY NODE;  Surgeon: Talbert Cage, DO;  Location: ASC OR [facility];  Service: Surgical Oncology Breast   ??? PR IMPLNT BIO IMPLNT FOR SOFT TISSUE REINFORCEMENT Left 05/30/2018    Procedure: IMPLANTATION BIOLOGIC IMPLANT(EG, ACELLULAR DERMAL MATRIX) FOR SOFT TISSUE REINFORCEMENT(EG, BREAST, TRUNK);  Surgeon: Arsenio Katz,  MD;  Location: ASC OR [facility];  Service: Plastics   ??? PR INSERT BREAST PROS IMMED AFTER EXCIS Left 05/30/2018    Procedure: IMMED INSRT BREAST PROSTH AFTER MASTOPEX/MASTECT;  Surgeon: Arsenio Katz, MD;  Location: ASC OR [facility];  Service: Plastics   ??? PR INTRAOPERATIVE SENTINEL LYMPH NODE ID W DYE INJECTION Left 05/30/2018    Procedure: INTRAOPERATIVE IDENTIFICATION SENTINEL LYMPH NODE(S) INCLUDE INJECTION NON-RADIOACTIVE DYE, WHEN PERFORMED;  Surgeon: Talbert Cage, DO;  Location: ASC OR [facility];  Service: Surgical Oncology Breast   ??? PR MASTECTOMY, SIMPLE, COMPLETE Left 05/30/2018    Procedure: Urgent MASTECTOMY, SIMPLE, COMPLETE;  Surgeon: Talbert Cage, DO;  Location: ASC OR [facility];  Service: Surgical Oncology Breast        Family History   Problem Relation Age of Onset   ??? Skin cancer Mother    ??? Breast cancer Neg Hx    ??? Colon cancer Neg Hx    ??? Endometrial cancer Neg Hx    ??? Ovarian cancer Neg Hx       Family Status   Relation Name Status   ??? Mother  (Not Specified)   ??? Neg Hx  (Not Specified)     Social History     Occupational History   ??? Not on file   Tobacco Use   ??? Smoking status: Never Smoker   ??? Smokeless tobacco: Never Used   Substance and Sexual Activity   ??? Alcohol use: Not Currently   ??? Drug use: Never   ??? Sexual activity: Not on file     She lives with her parents and is unemployed.    Allergies   Allergen Reactions   ??? Amoxicillin Other (See Comments)     EDEMA OF FACE         Current Outpatient Medications   Medication Sig Dispense Refill   ??? acetaminophen (TYLENOL) 325 MG tablet Take 325 mg by mouth every six (6) hours as needed for pain.     ??? docusate sodium (COLACE) 100 MG capsule Take 1 capsule (100 mg total) by mouth daily. 30 capsule 0   ??? ibuprofen (MOTRIN) 800 MG tablet Take 1 tablet (800 mg total) by mouth every eight (8) hours as needed for pain. 70 tablet 2   ??? LORazepam (ATIVAN) 0.5 MG tablet Take one-half to one tablet every 6 hours as needed for nausea/vomiting 30 tablet 0   ??? oxyCODONE (ROXICODONE) 5 MG immediate release tablet Take 1 tablet (5 mg total) by mouth every six (6) hours as needed for up to 5 days. 10 tablet 0   ??? pantoprazole (PROTONIX) 40 MG tablet Take 1 tablet (40 mg total) by mouth daily. 30 tablet 3 ??? sertraline (ZOLOFT) 100 MG tablet Take 1.5 tablets (150 mg total) by mouth daily. 45 tablet 1     No current facility-administered medications for this visit.        PHYSICAL EXAM  Deferred. Phone visit    Path:  Final Diagnosis   A: Breast, left, nipple-sparing mastectomy  - Invasive ductal carcinoma (see synoptic report)  - Tumor size: 8 mm (slide measurement)  - Biopsy site identified  - Margin status for specimen A (see extended margins below)               Invasive carcinoma: Negative, 1.8 mm from anterior margin  - Ancillary studies previously reported on [id number]               Estrogen receptor: Positive (  5%)               Progesterone receptor: Negative               HER2 IHC: Negative (0)  - Residual Cancer Burden   Tumor bed size: 8 mm x 7 mm  Overall tumor cellularity: 40%  Percentage in situ: 0%  Number of involved lymph nodes: 0  Diameter of largest metastasis: 0 mm  Residual Cancer Burden Score: 1.687  Residual Cancer Burden Class: RCB-II  ??  B: Left breast nipple core biopsy  - Negative for carcinoma  ??  C: Left axilla, sentinel lymph node #1, biopsy   - Three lymph nodes negative for carcinoma (0/3)  ??  D: Left axilla, sentinel lymph node #2, biopsy  - One lymph node negative for carcinoma (0/1)  ??  E: Breast, left, anterior margin, excision  - Negative for carcinoma  ??  F: Breast, left, additional tissue at 2:00, excision  - Negative for carcinoma                   Labs/Imaging/Results reviewed:   ECHO: 12/16/17  ?? Normal left ventricular systolic function, ejection fraction > 55%  ?? Normal right ventricular systolic function     CT chest/abd/pelvis: 12/16/17  --Left breast mass consistent with patient's known breast cancer. --Enlarged left axillary lymph nodes, concerning for nodal metastases.  --No evidence of metastatic disease in the abdomen and pelvis.    Bone scan: 12/16/17  --No osseous metastases.

## 2018-06-10 NOTE — Unmapped (Signed)
[facility] Specialty Medication Referral: No PA required    Medication (Brand/Generic): Capecitabine 500 mg tablets    Initial Benefits Investigation Claim completed with resulted information below:  No PA required  Patient ABLE to fill at [facility]  Insurance Company:  PAP  Anticipated Copay: $0.00    As Co-pay is under $25 defined limit, per policy there will be no further investigation of need for financial assistance at this time unless patient requests. This referral has been communicated to the provider and handed off to the [facility] team for further processing and filling of prescribed medication.   ______________________________________________________________________  Please utilize this referral for viewing purposes as it will serve as the central location for all relevant documentation and updates.

## 2018-06-10 NOTE — Unmapped (Signed)
Pharmacist Oral Chemotherapy Education    Medication: Capecitabine    Medication Education     Oral chemotherapy regimen: Capecitabine 1500 mg po BID x 14 days then 7 days off  Tentative Start Date: 06/16/18  Pharmacy: [facility]     Medication indication, administration twice daily after a meal, regimen schedule, and oral chemotherapy handling precautions were reviewed with the patient.  Side effects discussed included but were not limited to: nausea/vomiting, complications of myelosuppression, mucositis, diarrhea, hand/foot syndrome, and fatigue.  Patient was instructed to apply lotion to hands and feet twice daily to reduce risk of this adverse effect.  Avoidance of excessive friction/rubbing of hands and feet as well as wearing good-fitting, comfortable shoes can also reduce irritation.  I also discussed the use of Imodium to treat diarrhea,  good oral care with  salt water rinses to reduce the risk of mouth sores, and use of anti-emetics to treat nausea and vomiting.    Drug interactions: No interactions of concern identified    Handout provided: Chemotherapy handout from [facility]    Patient verbalized understanding of the above information as well as how to contact the Team with any questions/concerns.    Approximate time with patient: 25 minutes     Kyair Ditommaso Oleh Genin PharmD, BCOP, CPP  Hematology/Oncology Pharmacist  P: [phone]

## 2018-06-10 NOTE — Unmapped (Signed)
[facility]   Patient Onboarding/Medication Counseling    Sheri [name] is a 23 y.o. female with breast cancer who I am counseling today on initiation of therapy.  I am speaking to [employer] set up delivery.    Verified patient's date of birth / HIPAA.    Specialty medication(s) to be sent: Hematology/Oncology: Capecitabine 500mg       Non-specialty medications/supplies to be sent: n/a      Medications not needed at this time: n/a         The patient declined counseling on medication administration, missed dose instructions, goals of therapy, side effects and monitoring parameters, warnings and precautions, drug/food interactions and storage, handling precautions, and disposal because they were counseled in clinic. The information in the declined sections below are for informational purposes only and was not discussed with patient.     Xeloda (capecitabine)    Medication & Administration     Dosage: 1500mg  twice daily for 14 days on and 7 days off    Administration: I reviewed the importance of taking with a full glass of water within 30 minutes of a meal (at least 1 cup of food). Discussed that tablet should be swallowed whole and cannot be crushed or chewed.    Adherence/Missed dose instruction: If a dose is missed, do not take an extra dose or two doses at one time. Simply take your next dose at the regularly scheduled time and record any missed doses so our team is aware.    Goals of Therapy     Prevent disease progression    Side Effects & Monitoring Parameters   ? Nausea/vomiting  ? Diarrhea/constipation  ? Infection precautions  ? Fatigue  ? Mouth sores  ? Hand/foot syndrome (tingling, numbness, pain, redness, peeling or blistering)  ? Sun precautions  ? Decrease appetite/ taste changes  ? Bleeding precautions (bruising easily, nose bleeds, gums bleed)    The following side effects should be reported to the provider:  ? Heartbeat that doesn't feel normal (heart feels like it's racing, skipping a beat or fluttering)  ? Decrease in urination, change in color of urine, blood in urine  ? Dark urine  or yellow skin or eyes  ? Diarrhea not controlled by anti-diarrheals  ? Mouth irritation or mouth sores  ? Signs of allergic reaction (rash, hives, shortness of breath)  ? Redness or irritation on the palms of the hands or soles of the feet  ? Swelling, warmth, numbness, change of color or pain in a leg or arm  ? Signs of infection (fever >100.5, chills, sore throat)    Warnings & Precautions     ? Mouth sores-discussed use of baking soda/salt water rinses  ? Hand/foot prevention (moisturizers, luke warm hand washes, patting hands/feet dry, decrease rubbing on hands/feet)  ? Importance of hydration if having frequent diarrhea  ? Importance of good nutrition to help minimize diarrhea (high protein, BRAT, yogurt, avoid greasy and spicy foods)  ? Avoid live vaccines  ? Exposure of an unborn child to this medication could cause birth defects so you should not become pregnant or father a child while on this medication.    Drug/Food Interactions     ? Medication list reviewed in Epic. The patient was instructed to inform the care team before taking any new medications or supplements. No drug interactions identified. Avoid live vaccines.    Storage, Handling Precautions, & Disposal     ? This medication should be stored at  room temperature and in a dry location. Keep out of reach of others including children and pets. Keep the medicine in the original container with a child-proof top (no pillboxes). Do not throw away or flush unused medication down the toilet or sink. This drug is considered hazardous and should be handled as little as possible.  If someone else helps with medication administration, they should wear gloves.      Current Medications (including OTC/herbals), Comorbidities and Allergies     Current Outpatient Medications   Medication Sig Dispense Refill   ??? acetaminophen (TYLENOL) 325 MG tablet Take 325 mg by mouth every six (6) hours as needed for pain.     ??? capecitabine (XELODA) 500 MG tablet Take 3 tablets (1,500 mg total) by mouth Two (2) times a day . 14 days on then 7 days off , then repeat. 84 tablet 8   ??? docusate sodium (COLACE) 100 MG capsule Take 1 capsule (100 mg total) by mouth daily. 30 capsule 0   ??? ibuprofen (MOTRIN) 800 MG tablet Take 1 tablet (800 mg total) by mouth every eight (8) hours as needed for pain. 70 tablet 2   ??? LORazepam (ATIVAN) 0.5 MG tablet Take one-half to one tablet every 6 hours as needed for nausea/vomiting 30 tablet 0   ??? oxyCODONE (ROXICODONE) 5 MG immediate release tablet Take 1 tablet (5 mg total) by mouth every six (6) hours as needed for up to 5 days. 10 tablet 0   ??? pantoprazole (PROTONIX) 40 MG tablet Take 1 tablet (40 mg total) by mouth daily. 30 tablet 3   ??? sertraline (ZOLOFT) 100 MG tablet Take 1.5 tablets (150 mg total) by mouth daily. 45 tablet 1     No current facility-administered medications for this visit.        Allergies   Allergen Reactions   ??? Amoxicillin Other (See Comments)     EDEMA OF FACE       Patient Active Problem List   Diagnosis   ??? Malignant neoplasm of upper-outer quadrant of left breast in female, estrogen receptor negative (CMS-HCC)   ??? Microcytic anemia   ??? Symptomatic anemia       Reviewed and up to date in Epic.    Appropriateness of Therapy     Is medication and dose appropriate based on diagnosis? Yes    Baseline Quality of Life Assessment      How many days over the past month did your condition keep you from your normal activities? 0    Financial Information     Medication Assistance provided: None Required    Anticipated copay of $0 reviewed with patient. Verified delivery address.    Delivery Information     Scheduled delivery date: 06/13/18    Expected start date: TBD    Medication will be delivered via Next Day Courier to the home address in [place].  This shipment will not require a signature.      Explained the services we provide at [facility] and that each month we would call to set up refills.  Stressed importance of returning phone calls so that we could ensure they receive their medications in time each month.  Informed patient that we should be setting up refills 7-10 days prior to when they will run out of medication.  A pharmacist will reach out to perform a clinical assessment periodically.  Informed patient that a welcome packet and a drug information handout will be  sent.      Patient verbalized understanding of the above information as well as how to contact the pharmacy at [phone] option 4 with any questions/concerns.  The pharmacy is open Monday through Friday 8:30am-4:30pm.  A pharmacist is available 24/7 via pager to answer any clinical questions they may have.    Patient Specific Needs     ? Does the patient have any physical, cognitive, or cultural barriers? No    ? Patient prefers to have medications discussed with  Patient     ? Is the patient able to read and understand education materials at a high school level or above? Yes    ? Patient's primary language is  English     ? Is the patient high risk? No     ? Does the patient require a Care Management Plan? No     ? Does the patient require physician intervention or other additional services (i.e. nutrition, smoking cessation, social work)? No      Florene Route [facility] Specialty Pharmacist

## 2018-06-11 MED FILL — CAPECITABINE 500 MG TABLET: 14 days supply | Qty: 84 | Fill #0 | Status: AC

## 2018-06-12 ENCOUNTER — Ambulatory Visit: Admit: 2018-06-12 | Discharge: 2018-06-13

## 2018-06-12 DIAGNOSIS — Z9882 Breast implant status: Principal | ICD-10-CM

## 2018-06-12 NOTE — Unmapped (Signed)
CC:     HPI:  Mathilda Maguire is here today as a post op check after NSM and placement of a prepectoral TE 700 mL on 4/3.  100 mL fill intraoperatively.  Her ablative surgery was with Dr Dellis Anes.  She feels her current living arrangement with her grandmother is going well.  She has been emptying the drains based on volume, 1-2x/day.  Volume has been about 50/day and thin light serous.      Past Medical History:  Past Medical History:   Diagnosis Date   ??? Anxiety    ??? Depression    ??? Malignant neoplasm of upper-outer quadrant of left breast in female, estrogen receptor negative (CMS-HCC) 12/10/2017   ??? Panic disorder    ??? PTSD (post-traumatic stress disorder)        Past Surgical History:  Past Surgical History:   Procedure Laterality Date   ??? BREAST BIOPSY Left 2019    malignant   ??? CHEMOTHERAPY Left 11/2017    May 13 2018   ??? IR INSERT PORT AGE GREATER THAN 5 YRS  12/18/2017    IR INSERT PORT AGE GREATER THAN 5 YRS 12/18/2017 [name], MD IMG VIR [facility]BR   ??? PR BREAST RECONSTRUC W TISS [facility] Left 05/30/2018    Procedure: BREAST RECON IMMED/DELAY W/EXPANDR Shari Heritage EXPA;  Surgeon: Arsenio Katz, MD;  Location: ASC OR [facility];  Service: Plastics   ??? PR BX/REMV,LYMPH NODE,DEEP AXILL Left 05/30/2018    Procedure: BX/EXC LYMPH NODE; OPEN, DEEP AXILRY NODE;  Surgeon: Talbert Cage, DO;  Location: ASC OR [facility];  Service: Surgical Oncology Breast   ??? PR IMPLNT BIO IMPLNT FOR SOFT TISSUE REINFORCEMENT Left 05/30/2018    Procedure: IMPLANTATION BIOLOGIC IMPLANT(EG, ACELLULAR DERMAL MATRIX) FOR SOFT TISSUE REINFORCEMENT(EG, BREAST, TRUNK);  Surgeon: Arsenio Katz, MD;  Location: ASC OR [facility];  Service: Plastics   ??? PR INSERT BREAST PROS IMMED AFTER EXCIS Left 05/30/2018    Procedure: IMMED INSRT BREAST PROSTH AFTER MASTOPEX/MASTECT;  Surgeon: Arsenio Katz, MD;  Location: ASC OR [facility];  Service: Plastics   ??? PR INTRAOPERATIVE SENTINEL LYMPH NODE ID W DYE INJECTION Left 05/30/2018    Procedure: INTRAOPERATIVE IDENTIFICATION SENTINEL LYMPH NODE(S) INCLUDE INJECTION NON-RADIOACTIVE DYE, WHEN PERFORMED;  Surgeon: Talbert Cage, DO;  Location: ASC OR [facility];  Service: Surgical Oncology Breast   ??? PR MASTECTOMY, SIMPLE, COMPLETE Left 05/30/2018    Procedure: Urgent MASTECTOMY, SIMPLE, COMPLETE;  Surgeon: Talbert Cage, DO;  Location: ASC OR [facility];  Service: Surgical Oncology Breast       Medications:  Current Outpatient Medications   Medication Sig Dispense Refill   ??? acetaminophen (TYLENOL) 325 MG tablet Take 325 mg by mouth every six (6) hours as needed for pain.     ??? ibuprofen (MOTRIN) 800 MG tablet Take 1 tablet (800 mg total) by mouth every eight (8) hours as needed for pain. 70 tablet 2   ??? LORazepam (ATIVAN) 0.5 MG tablet Take one-half to one tablet every 6 hours as needed for nausea/vomiting 30 tablet 0   ??? ondansetron (ZOFRAN-ODT) 4 MG disintegrating tablet Take 1 tablet (4 mg total) by mouth every eight (8) hours as needed for nausea for up to 7 days. 10 tablet 0   ??? oxyCODONE (ROXICODONE) 5 MG immediate release tablet Take 1 tablet (5 mg total) by mouth every six (6) hours as needed for up to 5 days. 10 tablet 0   ??? pantoprazole (PROTONIX) 40 MG tablet Take 1  tablet (40 mg total) by mouth daily. 30 tablet 3   ??? sertraline (ZOLOFT) 100 MG tablet Take 1.5 tablets (150 mg total) by mouth daily. 45 tablet 1   ??? capecitabine (XELODA) 500 MG tablet Take 3 tablets (1,500 mg total) by mouth Two (2) times a day . 14 days on then 7 days off , then repeat. (Patient not taking: Reported on 06/12/2018) 84 tablet 8     No current facility-administered medications for this visit.        Allergies:  Allergies   Allergen Reactions   ??? Amoxicillin Other (See Comments)     EDEMA OF FACE       Family History:   Family History   Problem Relation Age of Onset   ??? Skin cancer Mother    ??? Breast cancer Neg Hx    ??? Colon cancer Neg Hx    ??? Endometrial cancer Neg Hx    ??? Ovarian cancer Neg Hx     The patient reports no family history for bleeding disorders or anesthetic problems.    Social History:  Social History     Socioeconomic History   ??? Marital status: Single     Spouse name: Not on file   ??? Number of children: Not on file   ??? Years of education: Not on file   ??? Highest education level: Not on file   Occupational History   ??? Not on file   Social Needs   ??? Financial resource strain: Not on file   ??? Food insecurity     Worry: Not on file     Inability: Not on file   ??? Transportation needs     Medical: Not on file     Non-medical: Not on file   Tobacco Use   ??? Smoking status: Never Smoker   ??? Smokeless tobacco: Never Used   Substance and Sexual Activity   ??? Alcohol use: Not Currently   ??? Drug use: Never   ??? Sexual activity: Not on file   Lifestyle   ??? Physical activity     Days per week: Not on file     Minutes per session: Not on file   ??? Stress: Not on file   Relationships   ??? Social Wellsite geologist on phone: Not on file     Gets together: Not on file     Attends religious service: Not on file     Active member of club or organization: Not on file     Attends meetings of clubs or organizations: Not on file     Relationship status: Not on file   Other Topics Concern   ??? Not on file   Social History Narrative    The patient is single.  She lives at home with her mother and brother       ROS:   Otherwise, 12 point review of systems was completed and is negative except as per HPI.      Vitals:   Vitals:    06/12/18 0946   BP: 129/66   Pulse: 87   Resp: 16   Temp: 36.7 ??C (98.1 ??F)   SpO2: 96%     ?  Physical Exam:   Gen: AA in NAD  HEENT: NCAT, EOMI, PERRL, Non icteric sclerae, MMM  CV: RRR  RESP: quiet respirations on room air  EXT: warm and well perfused  Breast: right native breast no obvious abnormalities.  Left breast  with dressing in place; minimal soilage on the 4x4 gauze.  Tegaderm removed.  Incision well approximated.  Small <1 cm scab on tip of nipple.  Expander appears in appropriate position.  Drain serous.    PROCEDURE: The port was identified with a magnet, and marked. ??This was cleansed with chloroprep. ??The 22G butterfly needle was used to access the port after the prep dried fully. ??Hard metal backing was felt, and the blue dye aspirated.????150 mL was instilled without incident. ??Skin check was normal and there was no discomfort for the patient.????  ?  Impression and Plan:  Lindsay Straka is here today as a post op check after NSM and placement of a prepectoral TE 700 mL on 4/3.  100 mL fill intraoperatively.  Doing well overall.  -Continue drain and restrictions  -RTC one week for fill  -TE fill +150: total 250/700         No follow-ups on file.

## 2018-06-12 NOTE — Unmapped (Signed)
We were able to evaluate you in clinic today, everything seems to be healing great!    You have been doing a great job with your drain log.  Continue keeping a close track of it.    We were able to remove the steri strips on your incision and expand you on the L breast with an extra 150 mL of saline bringing you up to a total of .  Don't shower for a day after your expansion to give the puncture site a chance to heal.  You can put a dry piece of gauze on the incision site inside of your bra for padding and comfort.    We will see you back in a week for another expansion and possible drain removal.  If you have any questions or concerns, please reach out to [place]!    [facility] Plastic Surgery Contact Phone Numbers    APPOINTMENTS / QUESTIONS    [facility]   [address] [place], [place] [address]  T: [phone]  F: [phone]  Website:  [facility]     Ambulatory Surgery Center  1st Floor, Ambulatory Care Center  [address]  [place], [place] [address]  T: [phone]  Website:  [facility]    [facility]  [address], [place], [place] [address]  Ground Floor [facility]  T: [phone]  F: [phone]    [facility]  [address] [place], [place] [address]   1st Floor [facility]  T: [phone]  F: [phone]    Administrative Office  [address]  [place], [place] [address]  T:  [phone]  F:  [phone]        Physicians:  Jackquline Bosch MD:   Nurse:  Sarita Bottom BSN, RN [phone] [email or web address]  Surgery Scheduler: Lysle Morales  [phone] [email or web address]     Epimenio Sarin MD:   Nurse:  Elisabeth Pigeon BSN, RN, CPSN [phone] [email or web address]  Surgery Scheduler: Lysle Morales  [phone] [email or web address]    Tarry Kos MD:   Nurse:  Elisabeth Pigeon BSN, RN, CPSN [phone] [email or web address]  Surgery Scheduler: Claris Che  [phone] [email or web address]    Marylu Lund, MD:   Nurse:  Sarita Bottom BSN, RN [phone] [email or web address]  Surgery Scheduler: Lysle Morales  [phone] [email or web address]     Adeyemi Clydie Braun MD:  Nurse: Mila Palmer BSN, RN, [facility]  [phone] [email or web address]  Surgery Scheduler: Kristine Royal [phone] [email or web address]    Darleene Cleaver MD:  Nurse: Venetia Maxon BSN, RN, CPSN  [phone]  [email or web address]  Surgery Scheduler: Kristine Royal [phone] [email or web address]    Nurse Practitioner:  Ezekiel Slocumb, DNP, NP-C:   Medical Assistant: Waymon Amato, RMA  [phone] [email or web address]  Surgery Scheduler ([facility]): Kristine Royal [phone] [email or web address]  Surgery Scheduler ([facility]): Claris Che  [phone] [email or web address]       AFTER HOURS/HOLIDAYS:  For emergencies after-hours (after 5pm or weekends): call the [facility] operator [phone]) and ask to page the Plastic Surgery resident on call. You will be directed to a surgery resident who likely is not immediately aware of the details of your case, but can help you deal with any emergencies that cannot wait until regular business hours.  Please be aware that this person is responding to many in-hospital emergencies and patient issues and may not answer your phone call immediately, but will return your call as soon as possible.    Financial  Counselor:   T: [phone]  F: [phone]    Precare Location:  [facility] Pre-Procedure Services at [facility])   [address]  [place], [place] [address] [facility] ??? old [address] near [employer])    PRECARE: The day prior to your scheduled surgery pre-care will call you with instructions.  If you have not heard from them by 4pm and would like to check on the status of your surgery please call:  MAIN: [phone]  [facility]: [phone]  [facility]: [phone]  [place]:  [phone]    [facility]:  [phone]    [facility] forms and paperwork:  There is a $25 fee for each form that needs completed.  Please allow  a 2 week turn around for forms to be completed and faxed.

## 2018-06-16 NOTE — Unmapped (Signed)
COVID-19 Screening Questions:  - Have you traveled outside of the state in the last two weeks? No  - Have you come in close contact with any person(s) with confirmed COVID-19? No  - In the last two weeks have you had a fever greater than 100.71F? No  - Do you have any new or worsening respiratory symptoms as far as: cough, SOB, sore throat or upper respiratory nasal congestion?  No  - Do you have any new or worsening loss of smell or taste, not related to chemo? No    Pt acknowledges visit type: lab only     Advised of visitor restrictions: Patients are  encouraged not to bring visitors with them to their appointment unless needed for safety reasons. Advised that they will be given a mask and screened upon arrival.

## 2018-06-17 ENCOUNTER — Ambulatory Visit: Admit: 2018-06-17 | Discharge: 2018-06-18 | Attending: Medical Oncology | Primary: Medical Oncology

## 2018-06-17 ENCOUNTER — Institutional Professional Consult (permissible substitution): Admit: 2018-06-17 | Discharge: 2018-06-18

## 2018-06-17 ENCOUNTER — Ambulatory Visit: Admit: 2018-06-17 | Discharge: 2018-06-18

## 2018-06-17 DIAGNOSIS — C50412 Malignant neoplasm of upper-outer quadrant of left female breast: Principal | ICD-10-CM

## 2018-06-17 DIAGNOSIS — Z171 Estrogen receptor negative status [ER-]: Secondary | ICD-10-CM

## 2018-06-17 LAB — COMPREHENSIVE METABOLIC PANEL
ALBUMIN: 4.2 g/dL (ref 3.5–5.0)
ALKALINE PHOSPHATASE: 88 U/L (ref 38–126)
ALT (SGPT): 29 U/L (ref <35)
AST (SGOT): 22 U/L (ref 14–38)
BILIRUBIN TOTAL: 0.3 mg/dL (ref 0.0–1.2)
BLOOD UREA NITROGEN: 10 mg/dL (ref 7–21)
BUN / CREAT RATIO: 23
CALCIUM: 9.6 mg/dL (ref 8.5–10.2)
CHLORIDE: 105 mmol/L (ref 98–107)
CREATININE: 0.43 mg/dL — ABNORMAL LOW (ref 0.60–1.00)
EGFR CKD-EPI AA FEMALE: >90 mL/min/{1.73_m2} (ref >=60)
EGFR CKD-EPI NON-AA FEMALE: >90 mL/min/{1.73_m2} (ref >=60)
GLUCOSE RANDOM: 115 mg/dL (ref 65–179)
GLUCOSE RANDOM: 115 mg/dL — ABNORMAL LOW (ref 65–179)
POTASSIUM: 4.6 mmol/L (ref 3.5–5.0)
PROTEIN TOTAL: 7.2 g/dL (ref 6.5–8.3)
SODIUM: 143 mmol/L (ref 135–145)

## 2018-06-17 LAB — CBC W/ AUTO DIFF
BASOPHILS RELATIVE PERCENT: 0.3 %
EOSINOPHILS ABSOLUTE COUNT: 0.2 10*9/L (ref 0.0–0.4)
EOSINOPHILS RELATIVE PERCENT: 3.8 %
HEMATOCRIT: 34.8 % — ABNORMAL LOW (ref 36.0–46.0)
HEMOGLOBIN: 11.7 g/dL — ABNORMAL LOW (ref 13.5–16.0)
LARGE UNSTAINED CELLS: 1 % (ref 0–4)
LYMPHOCYTES ABSOLUTE COUNT: 0.8 10*9/L — ABNORMAL LOW (ref 1.5–5.0)
LYMPHOCYTES RELATIVE PERCENT: 15.5 %
MEAN CORPUSCULAR HEMOGLOBIN CONC: 33.7 g/dL (ref 31.0–37.0)
MEAN CORPUSCULAR HEMOGLOBIN: 32.3 pg (ref 26.0–34.0)
MEAN CORPUSCULAR VOLUME: 95.8 fL (ref 80.0–100.0)
MEAN PLATELET VOLUME: 7.1 fL (ref 7.0–10.0)
MEAN PLATELET VOLUME: 7.1 fL — ABNORMAL HIGH (ref 7.0–10.0)
MONOCYTES ABSOLUTE COUNT: 0.3 10*9/L (ref 0.2–0.8)
NEUTROPHILS ABSOLUTE COUNT: 3.8 10*9/L (ref 2.0–7.5)
NEUTROPHILS RELATIVE PERCENT: 73.7 %
PLATELET COUNT: 330 10*9/L (ref 150–440)
RED BLOOD CELL COUNT: 3.63 10*12/L — ABNORMAL LOW (ref 4.00–5.20)
RED CELL DISTRIBUTION WIDTH: 15.2 % — ABNORMAL HIGH (ref 12.0–15.0)
WBC ADJUSTED: 5.2 10*9/L (ref 4.5–11.0)

## 2018-06-17 MED FILL — PANTOPRAZOLE 40 MG TABLET,DELAYED RELEASE: ORAL | 30 days supply | Qty: 30 | Fill #0

## 2018-06-17 MED FILL — PANTOPRAZOLE 40 MG TABLET,DELAYED RELEASE: 30 days supply | Qty: 30 | Fill #0 | Status: AC

## 2018-06-17 NOTE — Unmapped (Signed)
Labs drawn via [place].    Port flushed and brisk blood return noted.    Pt tolerated well.    Pt stable and ambulated independently from clinic.  VWilliams,RN.

## 2018-06-19 ENCOUNTER — Ambulatory Visit: Admit: 2018-06-19 | Discharge: 2018-06-20

## 2018-06-19 DIAGNOSIS — Z9882 Breast implant status: Principal | ICD-10-CM

## 2018-06-20 NOTE — Unmapped (Signed)
CC:     HPI:  Sheri Deanis here today as a post op check after NSM??and placement of a prepectoral TE 700 mL on 4/3. ??100 mL fill intraoperatively. ??Her ablative surgery was with Dr Dellis Anes. ??She feels her current living arrangement with her grandmother is going well. ??She has been emptying the drains based on volume, 1-2x/day. ??Volume has been about 25-30/day and thin light serous.  She has minimal pain and tolerated her first expansion well.      Past Medical History:  Past Medical History:   Diagnosis Date   ??? Anxiety    ??? Depression    ??? Malignant neoplasm of upper-outer quadrant of left breast in female, estrogen receptor negative (CMS-HCC) 12/10/2017   ??? Panic disorder    ??? PTSD (post-traumatic stress disorder)        Past Surgical History:  Past Surgical History:   Procedure Laterality Date   ??? BREAST BIOPSY Left 2019    malignant   ??? CHEMOTHERAPY Left 11/2017    May 13 2018   ??? IR INSERT PORT AGE GREATER THAN 5 YRS  12/18/2017    IR INSERT PORT AGE GREATER THAN 5 YRS 12/18/2017 [name], MD IMG VIR [facility]   ??? PR BREAST RECONSTRUC W TISS [facility] Left 05/30/2018    Procedure: BREAST RECON IMMED/DELAY W/EXPANDR Shari Heritage EXPA;  Surgeon: Arsenio Katz, MD;  Location: ASC OR [facility];  Service: Plastics   ??? PR BX/REMV,LYMPH NODE,DEEP AXILL Left 05/30/2018    Procedure: BX/EXC LYMPH NODE; OPEN, DEEP AXILRY NODE;  Surgeon: Talbert Cage, DO;  Location: ASC OR [facility];  Service: Surgical Oncology Breast   ??? PR IMPLNT BIO IMPLNT FOR SOFT TISSUE REINFORCEMENT Left 05/30/2018    Procedure: IMPLANTATION BIOLOGIC IMPLANT(EG, ACELLULAR DERMAL MATRIX) FOR SOFT TISSUE REINFORCEMENT(EG, BREAST, TRUNK);  Surgeon: Arsenio Katz, MD;  Location: ASC OR [facility];  Service: Plastics   ??? PR INSERT BREAST PROS IMMED AFTER EXCIS Left 05/30/2018    Procedure: IMMED INSRT BREAST PROSTH AFTER MASTOPEX/MASTECT;  Surgeon: Arsenio Katz, MD;  Location: ASC OR [facility];  Service: Plastics   ??? PR INTRAOPERATIVE SENTINEL LYMPH NODE ID W DYE INJECTION Left 05/30/2018    Procedure: INTRAOPERATIVE IDENTIFICATION SENTINEL LYMPH NODE(S) INCLUDE INJECTION NON-RADIOACTIVE DYE, WHEN PERFORMED;  Surgeon: Talbert Cage, DO;  Location: ASC OR [facility];  Service: Surgical Oncology Breast   ??? PR MASTECTOMY, SIMPLE, COMPLETE Left 05/30/2018    Procedure: Urgent MASTECTOMY, SIMPLE, COMPLETE;  Surgeon: Talbert Cage, DO;  Location: ASC OR [facility];  Service: Surgical Oncology Breast       Medications:  Current Outpatient Medications   Medication Sig Dispense Refill   ??? acetaminophen (TYLENOL) 325 MG tablet Take 325 mg by mouth every six (6) hours as needed for pain.     ??? capecitabine (XELODA) 500 MG tablet Take 3 tablets (1,500 mg total) by mouth Two (2) times a day . 14 days on then 7 days off , then repeat. (Patient not taking: Reported on 06/12/2018) 84 tablet 8   ??? ibuprofen (MOTRIN) 800 MG tablet Take 1 tablet (800 mg total) by mouth every eight (8) hours as needed for pain. 70 tablet 2   ??? LORazepam (ATIVAN) 0.5 MG tablet Take one-half to one tablet every 6 hours as needed for nausea/vomiting 30 tablet 0   ??? pantoprazole (PROTONIX) 40 MG tablet Take 1 tablet (40 mg total) by mouth daily. 30 tablet 3   ??? sertraline (ZOLOFT) 100 MG tablet Take 1.5  tablets (150 mg total) by mouth daily. 45 tablet 1     No current facility-administered medications for this visit.        Allergies:  Allergies   Allergen Reactions   ??? Amoxicillin Other (See Comments)     EDEMA OF FACE       Family History:   Family History   Problem Relation Age of Onset   ??? Skin cancer Mother    ??? Breast cancer Neg Hx    ??? Colon cancer Neg Hx    ??? Endometrial cancer Neg Hx    ??? Ovarian cancer Neg Hx     The patient reports no family history for bleeding disorders or anesthetic problems.    Social History:  Social History     Socioeconomic History   ??? Marital status: Single     Spouse name: Not on file   ??? Number of children: Not on file   ??? Years of education: Not on file   ??? Highest education level: Not on file   Occupational History   ??? Not on file   Social Needs   ??? Financial resource strain: Not on file   ??? Food insecurity     Worry: Not on file     Inability: Not on file   ??? Transportation needs     Medical: Not on file     Non-medical: Not on file   Tobacco Use   ??? Smoking status: Never Smoker   ??? Smokeless tobacco: Never Used   Substance and Sexual Activity   ??? Alcohol use: Not Currently   ??? Drug use: Never   ??? Sexual activity: Not on file   Lifestyle   ??? Physical activity     Days per week: Not on file     Minutes per session: Not on file   ??? Stress: Not on file   Relationships   ??? Social Wellsite geologist on phone: Not on file     Gets together: Not on file     Attends religious service: Not on file     Active member of club or organization: Not on file     Attends meetings of clubs or organizations: Not on file     Relationship status: Not on file   Other Topics Concern   ??? Not on file   Social History Narrative    The patient is single.  She lives at home with her mother and brother       ROS:   Otherwise, 12 point review of systems was completed and is negative except as per HPI.      Vitals:   Vitals:    06/19/18 1309   BP: 133/66   Pulse: 96   Resp: 16   Temp: 36.1 ??C (97 ??F)   SpO2: 98%     ?  Physical Exam:   Gen: AA in NAD  HEENT: NCAT, EOMI, PERRL, Non icteric sclerae, MMM  CV: RRR  RESP: quiet respirations on room air  EXT: warm and well perfused  Breast: right native breast no obvious abnormalities. ??Left breast with small <1 cm scab on tip of nipple.  Expander appears in appropriate position. ??Drain serous.  ??  PROCEDURE: The port was identified with a magnet, and marked. ??This was cleansed with chloroprep. ??The 22G butterfly needle was used to access the port after the prep dried fully. ??Hard metal backing was felt, and the blue dye aspirated.????150??mL was instilled  without incident. ??Skin check was normal and there was no discomfort for the patient.????    ?  Impression and Plan:  Sheri Deanis here today as a post op check after NSM??and placement of a prepectoral TE 700 mL on 4/3. ??100 mL fill intraoperatively. ??Doing well overall.  -Drain removed  -Continue restrictions  -RTC one week for fill  -TE fill +150: total 400/700         No follow-ups on file.

## 2018-06-25 NOTE — Unmapped (Signed)
Phone note    I received a message from patient requesting another appointment because she is not doing well. I discussed the case in [facility] this morning and [name], NP agreed to see patient since I do not currently have availability.     I called patient to check in on her. She said things have been difficult recently and she is trying to adjust to the new me since she had surgery for her breast cancer. She is having difficulties with family and friends. Patient also notes she has been dissociating more, and feels like things aren't real. She denies SI/HI. Asked patient if she would reach out if she developed suicidal thoughts, and she said she would.     It appears that patient may be on tamoxifen in the future (ER 5%+), and therefore it may be a good time to switch from Zoloft to another medication that may not interact with tamoxifen. Will be helpful to coordinate with oncologist. Discussed this with patient on phone. She expressed understanding.    Vertell Limber, MD  Psychiatry CL Fellow

## 2018-06-26 ENCOUNTER — Ambulatory Visit: Admit: 2018-06-26 | Discharge: 2018-06-27

## 2018-06-26 ENCOUNTER — Encounter: Admit: 2018-06-26 | Discharge: 2018-06-27 | Payer: MEDICAID

## 2018-06-26 DIAGNOSIS — Z9882 Breast implant status: Principal | ICD-10-CM

## 2018-06-26 DIAGNOSIS — C50912 Malignant neoplasm of unspecified site of left female breast: Secondary | ICD-10-CM

## 2018-06-26 NOTE — Unmapped (Signed)
CC:     HPI:  Sheri [name]??is here today as a post op check after NSM??and placement of a prepectoral TE 700 mL on 4/3. ??100 mL fill intraoperatively.????Her ablative surgery??was??with Dr Dellis Anes. ??She noted some tightness after the last expansion but this was tolerable and resolved after 24 hours.  She notes in the left upper arm some tightness, not in the axilla.  She denies heaviness in the arm itself and no edema that she notes.    Past Medical History:  Past Medical History:   Diagnosis Date   ??? Anxiety    ??? Depression    ??? Malignant neoplasm of upper-outer quadrant of left breast in female, estrogen receptor negative (CMS-HCC) 12/10/2017   ??? Panic disorder    ??? PTSD (post-traumatic stress disorder)        Past Surgical History:  Past Surgical History:   Procedure Laterality Date   ??? BREAST BIOPSY Left 2019    malignant   ??? CHEMOTHERAPY Left 11/2017    May 13 2018   ??? IR INSERT PORT AGE GREATER THAN 5 YRS  12/18/2017    IR INSERT PORT AGE GREATER THAN 5 YRS 12/18/2017 [name], MD IMG [facility]   ??? PR BREAST RECONSTRUC W TISS [facility] Left 05/30/2018    Procedure: BREAST RECON IMMED/DELAY W/EXPANDR Shari Heritage EXPA;  Surgeon: Arsenio Katz, MD;  Location: ASC OR [facility];  Service: Plastics   ??? PR BX/REMV,LYMPH NODE,DEEP AXILL Left 05/30/2018    Procedure: BX/EXC LYMPH NODE; OPEN, DEEP AXILRY NODE;  Surgeon: Talbert Cage, DO;  Location: ASC OR [facility];  Service: Surgical Oncology Breast   ??? PR IMPLNT BIO IMPLNT FOR SOFT TISSUE REINFORCEMENT Left 05/30/2018    Procedure: IMPLANTATION BIOLOGIC IMPLANT(EG, ACELLULAR DERMAL MATRIX) FOR SOFT TISSUE REINFORCEMENT(EG, BREAST, TRUNK);  Surgeon: Arsenio Katz, MD;  Location: ASC OR [facility];  Service: Plastics   ??? PR INSERT BREAST PROS IMMED AFTER EXCIS Left 05/30/2018    Procedure: IMMED INSRT BREAST PROSTH AFTER MASTOPEX/MASTECT;  Surgeon: Arsenio Katz, MD;  Location: ASC OR [facility];  Service: Plastics   ??? PR INTRAOPERATIVE SENTINEL LYMPH NODE ID W DYE INJECTION Left 05/30/2018    Procedure: INTRAOPERATIVE IDENTIFICATION SENTINEL LYMPH NODE(S) INCLUDE INJECTION NON-RADIOACTIVE DYE, WHEN PERFORMED;  Surgeon: Talbert Cage, DO;  Location: ASC OR [facility];  Service: Surgical Oncology Breast   ??? PR MASTECTOMY, SIMPLE, COMPLETE Left 05/30/2018    Procedure: Urgent MASTECTOMY, SIMPLE, COMPLETE;  Surgeon: Talbert Cage, DO;  Location: ASC OR [facility];  Service: Surgical Oncology Breast       Medications:  Current Outpatient Medications   Medication Sig Dispense Refill   ??? acetaminophen (TYLENOL) 325 MG tablet Take 325 mg by mouth every six (6) hours as needed for pain.     ??? capecitabine (XELODA) 500 MG tablet Take 3 tablets (1,500 mg total) by mouth Two (2) times a day . 14 days on then 7 days off , then repeat. (Patient not taking: Reported on 06/12/2018) 84 tablet 8   ??? ibuprofen (MOTRIN) 800 MG tablet Take 1 tablet (800 mg total) by mouth every eight (8) hours as needed for pain. 70 tablet 2   ??? LORazepam (ATIVAN) 0.5 MG tablet Take one-half to one tablet every 6 hours as needed for nausea/vomiting 30 tablet 0   ??? pantoprazole (PROTONIX) 40 MG tablet Take 1 tablet (40 mg total) by mouth daily. 30 tablet 3   ??? sertraline (ZOLOFT) 100 MG tablet Take 1.5 tablets (150  mg total) by mouth daily. 45 tablet 1     No current facility-administered medications for this visit.        Allergies:  Allergies   Allergen Reactions   ??? Amoxicillin Other (See Comments)     EDEMA OF FACE       Family History:   Family History   Problem Relation Age of Onset   ??? Skin cancer Mother    ??? Breast cancer Neg Hx    ??? Colon cancer Neg Hx    ??? Endometrial cancer Neg Hx    ??? Ovarian cancer Neg Hx     The patient reports no family history for bleeding disorders or anesthetic problems.    Social History:  Social History     Socioeconomic History   ??? Marital status: Single     Spouse name: Not on file   ??? Number of children: Not on file   ??? Years of education: Not on file   ??? Highest education level: Not on file   Occupational History   ??? Not on file   Social Needs   ??? Financial resource strain: Not on file   ??? Food insecurity     Worry: Not on file     Inability: Not on file   ??? Transportation needs     Medical: Not on file     Non-medical: Not on file   Tobacco Use   ??? Smoking status: Never Smoker   ??? Smokeless tobacco: Never Used   Substance and Sexual Activity   ??? Alcohol use: Not Currently   ??? Drug use: Never   ??? Sexual activity: Not on file   Lifestyle   ??? Physical activity     Days per week: Not on file     Minutes per session: Not on file   ??? Stress: Not on file   Relationships   ??? Social Wellsite geologist on phone: Not on file     Gets together: Not on file     Attends religious service: Not on file     Active member of club or organization: Not on file     Attends meetings of clubs or organizations: Not on file     Relationship status: Not on file   Other Topics Concern   ??? Not on file   Social History Narrative    The patient is single.  She lives at home with her mother and brother       ROS:  Otherwise, 12 point review of systems was completed and is negative except as per HPI.      Vitals:   Vitals:    06/26/18 1413   BP: 130/71   Pulse: 97   Resp: 18   Temp: 36.5 ??C (97.7 ??F)   SpO2: 97%     ?  Physical Exam:   Gen: AA in NAD  HEENT: NCAT, EOMI, PERRL, Non icteric sclerae, MMM  CV: RRR  RESP: quiet respirations on room air  EXT: warm and well perfused  Breast: right native breast no obvious abnormalities. ??Left breast with small <1 cm scab on tip of nipple. ??Expander appears in appropriate position. ??Drain serous.  ??  PROCEDURE:??The port was identified with a magnet, and marked. ??This was cleansed with chloroprep. ??The 22G butterfly needle was used to access the port after the prep dried fully. ??Hard metal backing was felt, and the blue dye aspirated.????150??mL was instilled without incident. ??Skin check was  normal and there was no discomfort for the patient.????    ?  Impression and Plan:  Sheri [name]??is here today as a post op check after NSM??and placement of a prepectoral TE 700 mL on 4/3. ??100 mL fill intraoperatively. ??Doing well overall.  -Drain removed  -Continue restrictions  -RTC one week for fill  -TE??fill +150:??total??550/700??        No follow-ups on file.

## 2018-06-30 NOTE — Unmapped (Signed)
[facility] Clinical Assessment & Refill Coordination Note    Sheri [name], DOB: [birth year]  Phone: [phone] (home)     All above HIPAA information was verified with patient.     Specialty Medication(s):   Hematology/Oncology: Capecitabine 500mg      Current Outpatient Medications   Medication Sig Dispense Refill   ??? acetaminophen (TYLENOL) 325 MG tablet Take 325 mg by mouth every six (6) hours as needed for pain.     ??? capecitabine (XELODA) 500 MG tablet Take 3 tablets (1,500 mg total) by mouth Two (2) times a day . 14 days on then 7 days off , then repeat. (Patient not taking: Reported on 06/12/2018) 84 tablet 8   ??? ibuprofen (MOTRIN) 800 MG tablet Take 1 tablet (800 mg total) by mouth every eight (8) hours as needed for pain. 70 tablet 2   ??? LORazepam (ATIVAN) 0.5 MG tablet Take one-half to one tablet every 6 hours as needed for nausea/vomiting 30 tablet 0   ??? pantoprazole (PROTONIX) 40 MG tablet Take 1 tablet (40 mg total) by mouth daily. 30 tablet 3   ??? sertraline (ZOLOFT) 100 MG tablet Take 1.5 tablets (150 mg total) by mouth daily. 45 tablet 1     No current facility-administered medications for this visit.         Changes to medications: Sheri [name] reports no changes at this time.    Allergies   Allergen Reactions   ??? Amoxicillin Other (See Comments)     EDEMA OF FACE       Changes to allergies: No    SPECIALTY MEDICATION ADHERENCE     Capecitabine 500 mg: 3 days of medicine on hand       Medication Adherence    Patient reported X missed doses in the last month:  0  Specialty Medication:  Capecitabine          Specialty medication(s) dose(s) confirmed: Regimen is correct and unchanged.     Are there any concerns with adherence? No    Adherence counseling provided? Not needed    CLINICAL MANAGEMENT AND INTERVENTION      Clinical Benefit Assessment:    Do you feel the medicine is effective or helping your condition? No    Clinical Benefit counseling provided? Not needed    Adverse Effects Assessment:    Are you experiencing any side effects? No    Are you experiencing difficulty administering your medicine? No    Quality of Life Assessment:    How many days over the past month did your condition/medication  keep you from your normal activities? For example, brushing your teeth or getting up in the morning. 0    Have you discussed this with your provider? Not needed    Therapy Appropriateness:    Is therapy appropriate? Yes, therapy is appropriate and should be continued    DISEASE/MEDICATION-SPECIFIC INFORMATION      N/A    PATIENT SPECIFIC NEEDS     ? Does the patient have any physical, cognitive, or cultural barriers? No    ? Is the patient high risk? No     ? Does the patient require a Care Management Plan? No     ? Does the patient require physician intervention or other additional services (i.e. nutrition, smoking cessation, social work)? No      SHIPPING     Specialty Medication(s) to be Shipped:   Hematology/Oncology: Capecitabine 500mg     Other medication(s) to be shipped:  n/a     Changes to insurance: No    Delivery Scheduled: Yes, Expected medication delivery date: 07/07/18.     Medication will be delivered via Same Day Courier to the confirmed home address in [facility].    The patient will receive a drug information handout for each medication shipped and additional FDA Medication Guides as required.  Verified that patient has previously received a Conservation officer, historic buildings.    Rollen Sox   [facility] Specialty Pharmacist

## 2018-06-30 NOTE — Unmapped (Signed)
Pre test COVID pre screening questions answered negative. [facility] scheduler sent message to schedule patient.

## 2018-06-30 NOTE — Unmapped (Signed)
Spoke with patient via phone. She is scheduled for the Covid Pre Test on 07-03-2018 @ 1pm. She is scheduled at the [facility] in [facility] location.              PRE PROCEDURE TEST   Received: Today   Message Contents   Theotis Barrio, RN  P Covid Rdc Schedulers      ??      Patient has a care plan that requires asymptomatic respiratory testing prior to implementation.   Colonoscopy on 07-07-18 at 1230 for which they need testing.   Please schedule a COVID PRE TEST appointment in [facility] QUICK TEST [facility] [place].

## 2018-07-02 NOTE — Unmapped (Signed)
Called x 2 and have left a message re needing more information re the draining area on her mons/perineal area from an infected hair. questions re is the swelling going down, is the drainage clear or infectious looking? How is the pain. Left the [phone] number for her to call and relay status this am. Expressed concern due to her being on an oral chemotherapy with an active infection.

## 2018-07-02 NOTE — Unmapped (Signed)
Adolescent and Young Adult Cancer Program Visit   Comprehensive Cancer Support Program     ALL AYA VISITS ARE BEING CONDUCTED REMOTELY VIA PHONE OR VIDEO.  THE AYA TEAM IS OFFSITE AND REMAINS AVAILABLE VIRTUALLY Monday - Friday from 8am - 5pm.    Service date: Jul 02, 2018    Encounter type: Virtual - text message exchange    Clinician: [name], [place]    Patient identifiers:??Sheri [name]??Sheri [name]??is a 23 y.o.??with newly diagnosed breast cancer and a history of panic disorder and PTSD.??Anela Bensman??uses she/her/hers??pronouns.??    VISIT SUMMARY     Notes: Texted Anyi Fels to check in and see if she is interested in finding a time to talk over video or phone. No response received, will continue to follow up.    I will continue to follow Prajna for AYA-appropriate support. Imogen has my contact information and has been encouraged to contact me as needed.     07/02/2018     [name], LCSWA  Adolescent and Young Adult Clinical Social Worker  Pager: [phone]  Available by page to conduct virtual care due to COVID-19

## 2018-07-03 ENCOUNTER — Ambulatory Visit: Admit: 2018-07-03 | Discharge: 2018-07-04 | Payer: MEDICAID

## 2018-07-03 ENCOUNTER — Ambulatory Visit: Admit: 2018-07-03 | Discharge: 2018-07-04

## 2018-07-03 DIAGNOSIS — Z9882 Breast implant status: Principal | ICD-10-CM

## 2018-07-03 NOTE — Unmapped (Signed)
CC:     HPI:  Sheri [name]??is here today as a post op check after NSM??and placement of a prepectoral TE 700 mL on 4/3. ??100 mL fill intraoperatively.????Her ablative surgery??was??with Dr Dellis Anes. ??She noted some tightness after the last expansion but this was tolerable and resolved after 24 hours.  She notes in the left upper arm some tightness, not in the axilla.  She denies heaviness in the arm itself and no edema that she notes.    She went home because she wanted to, but things got bad again, and she has been staying with a friend.  She is reliably getting to appointments and feels safe in the present situation. She has a psychiatrist who fills her medication and she has a visit with her tomorrow.  She would like a therapist, but because of COVID has not been able to arrange this.      Past Medical History:  Past Medical History:   Diagnosis Date   ??? Anxiety    ??? Depression    ??? Malignant neoplasm of upper-outer quadrant of left breast in female, estrogen receptor negative (CMS-HCC) 12/10/2017   ??? Panic disorder    ??? PTSD (post-traumatic stress disorder)        Past Surgical History:  Past Surgical History:   Procedure Laterality Date   ??? BREAST BIOPSY Left 2019    malignant   ??? CHEMOTHERAPY Left 11/2017    May 13 2018   ??? IR INSERT PORT AGE GREATER THAN 5 YRS  12/18/2017    IR INSERT PORT AGE GREATER THAN 5 YRS 12/18/2017 [name], MD IMG VIR [facility]   ??? PR BREAST RECONSTRUC W TISS [facility] Left 05/30/2018    Procedure: BREAST RECON IMMED/DELAY W/EXPANDR Shari Heritage EXPA;  Surgeon: Arsenio Katz, MD;  Location: ASC OR [facility];  Service: Plastics   ??? PR BX/REMV,LYMPH NODE,DEEP AXILL Left 05/30/2018    Procedure: BX/EXC LYMPH NODE; OPEN, DEEP AXILRY NODE;  Surgeon: Talbert Cage, DO;  Location: ASC OR [facility];  Service: Surgical Oncology Breast   ??? PR IMPLNT BIO IMPLNT FOR SOFT TISSUE REINFORCEMENT Left 05/30/2018    Procedure: IMPLANTATION BIOLOGIC IMPLANT(EG, ACELLULAR DERMAL MATRIX) FOR SOFT TISSUE REINFORCEMENT(EG, BREAST, TRUNK);  Surgeon: Arsenio Katz, MD;  Location: ASC OR [facility];  Service: Plastics   ??? PR INSERT BREAST PROS IMMED AFTER EXCIS Left 05/30/2018    Procedure: IMMED INSRT BREAST PROSTH AFTER MASTOPEX/MASTECT;  Surgeon: Arsenio Katz, MD;  Location: ASC OR [facility];  Service: Plastics   ??? PR INTRAOPERATIVE SENTINEL LYMPH NODE ID W DYE INJECTION Left 05/30/2018    Procedure: INTRAOPERATIVE IDENTIFICATION SENTINEL LYMPH NODE(S) INCLUDE INJECTION NON-RADIOACTIVE DYE, WHEN PERFORMED;  Surgeon: Talbert Cage, DO;  Location: ASC OR [facility];  Service: Surgical Oncology Breast   ??? PR MASTECTOMY, SIMPLE, COMPLETE Left 05/30/2018    Procedure: Urgent MASTECTOMY, SIMPLE, COMPLETE;  Surgeon: Talbert Cage, DO;  Location: ASC OR [facility];  Service: Surgical Oncology Breast       Medications:  Current Outpatient Medications   Medication Sig Dispense Refill   ??? acetaminophen (TYLENOL) 325 MG tablet Take 325 mg by mouth every six (6) hours as needed for pain.     ??? capecitabine (XELODA) 500 MG tablet Take 3 tablets (1,500 mg total) by mouth Two (2) times a day . 14 days on then 7 days off , then repeat. (Patient not taking: Reported on 06/12/2018) 84 tablet 8   ??? ibuprofen (MOTRIN) 800 MG tablet Take 1  tablet (800 mg total) by mouth every eight (8) hours as needed for pain. 70 tablet 2   ??? LORazepam (ATIVAN) 0.5 MG tablet Take one-half to one tablet every 6 hours as needed for nausea/vomiting 30 tablet 0   ??? pantoprazole (PROTONIX) 40 MG tablet Take 1 tablet (40 mg total) by mouth daily. 30 tablet 3   ??? sertraline (ZOLOFT) 100 MG tablet Take 1.5 tablets (150 mg total) by mouth daily. 45 tablet 1     No current facility-administered medications for this visit.        Allergies:  Allergies   Allergen Reactions   ??? Amoxicillin Other (See Comments)     EDEMA OF FACE       Family History:   Family History   Problem Relation Age of Onset   ??? Skin cancer Mother    ??? Breast cancer Neg Hx    ??? Colon cancer Neg Hx    ??? Endometrial cancer Neg Hx    ??? Ovarian cancer Neg Hx     The patient reports no family history for bleeding disorders or anesthetic problems.    Social History:  Social History     Socioeconomic History   ??? Marital status: Single     Spouse name: Not on file   ??? Number of children: Not on file   ??? Years of education: Not on file   ??? Highest education level: Not on file   Occupational History   ??? Not on file   Social Needs   ??? Financial resource strain: Not on file   ??? Food insecurity     Worry: Not on file     Inability: Not on file   ??? Transportation needs     Medical: Not on file     Non-medical: Not on file   Tobacco Use   ??? Smoking status: Never Smoker   ??? Smokeless tobacco: Never Used   Substance and Sexual Activity   ??? Alcohol use: Not Currently   ??? Drug use: Never   ??? Sexual activity: Not on file   Lifestyle   ??? Physical activity     Days per week: Not on file     Minutes per session: Not on file   ??? Stress: Not on file   Relationships   ??? Social Wellsite geologist on phone: Not on file     Gets together: Not on file     Attends religious service: Not on file     Active member of club or organization: Not on file     Attends meetings of clubs or organizations: Not on file     Relationship status: Not on file   Other Topics Concern   ??? Not on file   Social History Narrative    The patient is single.  She lives at home with her mother and brother       ROS:  Otherwise, 12 point review of systems was completed and is negative except as per HPI.      Vitals:   Vitals:    07/03/18 1454   BP: 121/71   Pulse: 92   Resp: 16   Temp: 36.1 ??C (96.9 ??F)   SpO2: 98%     ?  Physical Exam:   Gen: AA in NAD  HEENT: NCAT, EOMI, PERRL, Non icteric sclerae, MMM  CV: RRR  RESP: quiet respirations on room air  EXT: warm and well perfused  Breast: right  native breast no obvious abnormalities. ??Left breast with small <1 cm scab on tip of nipple. ??Expander appears in appropriate position. PROCEDURE:??The port was identified with a magnet, and marked. ??This was cleansed with chloroprep. ??The 22G butterfly needle was used to access the port after the prep dried fully. ??Hard metal backing was felt, and the blue dye aspirated.????150??mL was instilled without incident. ??Skin check was normal and there was no discomfort for the patient.????  ?  Impression and Plan:  Sheri [name]??is here today as a post op check after NSM??and placement of a prepectoral TE 700 mL on 4/3. ??100 mL fill intraoperatively. ??Doing well overall.  -Continue restrictions  -RTC one week for overfill  -TE??fill +150:??total??700/700??  -Will reach out to the cancer support team for recommendations for therapists         No follow-ups on file.

## 2018-07-07 MED FILL — CAPECITABINE 500 MG TABLET: ORAL | 14 days supply | Qty: 84 | Fill #1

## 2018-07-07 MED FILL — CAPECITABINE 500 MG TABLET: 14 days supply | Qty: 84 | Fill #1 | Status: AC

## 2018-07-08 ENCOUNTER — Ambulatory Visit: Admit: 2018-07-08 | Discharge: 2018-07-09 | Attending: Adult Health | Primary: Adult Health

## 2018-07-08 DIAGNOSIS — Z171 Estrogen receptor negative status [ER-]: Secondary | ICD-10-CM

## 2018-07-08 DIAGNOSIS — C50412 Malignant neoplasm of upper-outer quadrant of left female breast: Principal | ICD-10-CM

## 2018-07-08 LAB — CBC W/ AUTO DIFF
BASOPHILS ABSOLUTE COUNT: 0 10*9/L (ref 0.0–0.1)
BASOPHILS RELATIVE PERCENT: 0.2 %
EOSINOPHILS RELATIVE PERCENT: 4.2 %
HEMOGLOBIN: 12.6 g/dL (ref 12.0–16.0)
LARGE UNSTAINED CELLS: 1 % (ref 0–4)
LYMPHOCYTES ABSOLUTE COUNT: 0.8 10*9/L — ABNORMAL LOW (ref 1.5–5.0)
LYMPHOCYTES RELATIVE PERCENT: 16 %
MEAN CORPUSCULAR HEMOGLOBIN CONC: 33.6 g/dL (ref 31.0–37.0)
MEAN CORPUSCULAR HEMOGLOBIN: 32 pg (ref 26.0–34.0)
MEAN CORPUSCULAR VOLUME: 95.1 fL (ref 80.0–100.0)
MEAN PLATELET VOLUME: 8.2 fL (ref 7.0–10.0)
MONOCYTES ABSOLUTE COUNT: 0.4 10*9/L (ref 0.2–0.8)
NEUTROPHILS RELATIVE PERCENT: 70.6 %
PLATELET COUNT: 309 10*9/L (ref 150–440)
RED BLOOD CELL COUNT: 3.93 10*12/L — ABNORMAL LOW (ref 4.00–5.20)
RED CELL DISTRIBUTION WIDTH: 15.5 % — ABNORMAL HIGH (ref 12.0–15.0)
WBC ADJUSTED: 4.8 10*9/L (ref 4.5–11.0)

## 2018-07-08 LAB — BUN / CREAT RATIO: Urea nitrogen/Creatinine:MRto:Pt:Ser/Plas:Qn:: 15

## 2018-07-08 LAB — MONOCYTES ABSOLUTE COUNT: Lab: 0.4

## 2018-07-08 LAB — COMPREHENSIVE METABOLIC PANEL
ALBUMIN: 4 g/dL (ref 3.5–5.0)
ALKALINE PHOSPHATASE: 94 U/L (ref 38–126)
ALT (SGPT): 28 U/L (ref <35)
ANION GAP: 11 mmol/L (ref 7–15)
AST (SGOT): 20 U/L (ref 14–38)
BILIRUBIN TOTAL: 0.2 mg/dL (ref 0.0–1.2)
BLOOD UREA NITROGEN: 9 mg/dL (ref 7–21)
BUN / CREAT RATIO: 15
CALCIUM: 9.9 mg/dL (ref 8.5–10.2)
CHLORIDE: 106 mmol/L (ref 98–107)
CO2: 24 mmol/L (ref 22.0–30.0)
CREATININE: 0.61 mg/dL (ref 0.60–1.00)
EGFR CKD-EPI AA FEMALE: >90 mL/min/{1.73_m2} (ref >=60)
EGFR CKD-EPI NON-AA FEMALE: >90 mL/min/{1.73_m2} (ref >=60)
GLUCOSE RANDOM: 93 mg/dL (ref 70–179)
POTASSIUM: 4.2 mmol/L (ref 3.5–5.0)
PROTEIN TOTAL: 7.4 g/dL (ref 6.5–8.3)
SODIUM: 141 mmol/L (ref 135–145)
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
## 2018-07-09 ENCOUNTER — Telehealth: Admit: 2018-07-09 | Discharge: 2018-07-10

## 2018-07-09 DIAGNOSIS — F419 Anxiety disorder, unspecified: Secondary | ICD-10-CM

## 2018-07-09 DIAGNOSIS — F3341 Major depressive disorder, recurrent, in partial remission: Secondary | ICD-10-CM

## 2018-07-09 DIAGNOSIS — F4310 Post-traumatic stress disorder, unspecified: Principal | ICD-10-CM
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
## 2018-07-09 NOTE — Unmapped (Signed)
Adolescent and Young Adult Cancer Program Visit   Comprehensive Cancer Support Program     ALL AYA VISITS ARE BEING CONDUCTED REMOTELY VIA PHONE OR VIDEO.  THE AYA TEAM IS OFFSITE AND REMAINS AVAILABLE VIRTUALLY Monday - Friday from 8am - 5pm.    Service date: Jul 09, 2018    Encounter type: Virtual - telephone call, text message exchange  ??  Clinician: [name], LCSWA  ??  Patient identifiers:??Sheri??[name]??is a 23 y.o.??with newly diagnosed breast cancer and a history of panic disorder and PTSD.??Sheri [name]??uses she/her/hers??pronouns.??    VISIT SUMMARY     Notes: Called Sheri [name] to provide counseling support; no answer. Texted her, was able to plan for a video visit on Monday 5/18.      Plan: Will call on 5/18 for counseling and assessment of needs. Encouraged her to reach out to me or her team before then if needed.    I will continue to follow Sheri [name] for AYA-appropriate support. Sheri [name] has my contact information and has been encouraged to contact me as needed.     07/09/2018     [name], LCSWA  Adolescent and Young Adult Clinical Social Worker  Pager: [phone]  Available by page to conduct virtual care due to COVID-19

## 2018-07-10 ENCOUNTER — Ambulatory Visit: Admit: 2018-07-10 | Discharge: 2018-07-11

## 2018-07-10 DIAGNOSIS — Z9882 Breast implant status: Principal | ICD-10-CM

## 2018-07-10 DIAGNOSIS — C50912 Malignant neoplasm of unspecified site of left female breast: Secondary | ICD-10-CM

## 2018-07-10 NOTE — Unmapped (Signed)
It was a pleasure meeting you today. I look forward to working with you and seeing you again in 4 weeks.    Continue taking your sertraline (Zoloft) 150 mg.      Comprehensive Cancer Support Program (CCSP) - Psychiatry Outpatient Clinic   After Visit Summary    It was a pleasure to see you today in the [facility]s Comprehensive Cancer Support Program (CCSP). The CCSP is a multidisciplinary program dedicated to helping patients, caregivers, and families with cancer treatment, recovery and survivorship.      To schedule, cancel, or change your appointment:  Please call the [facility] schedulers at [phone], Monday through Friday 8AM - 5PM.  Someone will return your call within 24 hours.      If you have a question about your medicines or you need to contact your provider:  Please call the CCSP program coordinator, Myrene Galas, at [phone].     For after hours urgent issues, you may call [phone] or call the I need to talk line at [phone]-TALK ([phone]) anytime 24/7.    CCSP Patient and Family Resource Center: [phone].    CCSP Website:  [email or web address]    For prescription refills, please allow at least 24 hours (during business hours, M-F) for providers to call in refills to your pharmacy. We are generally unable to accommodate same-day requests for refills.     If you are taking any controlled substances (such as anxiety or sleep medications), you must use them as the directions say to use them. We generally do not provide early refills over the phone without clear reason, and it would be inappropriate to obtain the medications from other doctors. We routinely use the [place] controlled substance database to monitor prescription drug use.

## 2018-07-14 NOTE — Unmapped (Signed)
Adolescent and Young Adult Cancer Program Visit   Comprehensive Cancer Support Program     ALL AYA VISITS ARE BEING CONDUCTED REMOTELY VIA PHONE OR VIDEO.  THE AYA TEAM IS OFFSITE AND REMAINS AVAILABLE VIRTUALLY Monday - Friday from 8am - 5pm.    Service date: Jul 14, 2018    Encounter type: Virtual -??telephone call,??text message exchange, attempted video call  ??  Clinician:??[name], LCSWA  ??  Patient identifiers:??Sheri [name]??Sheri [name]??is a 23 y.o.??with newly diagnosed breast cancer and a history of panic disorder and PTSD.??Sheri [name]??uses she/her/hers??pronouns.??    VISIT SUMMARY     Notes: Attempted video call with [name] as scheduled. She had unexpected plans for her birthday and was unable talk. We rescheduled for tomorrow afternoon.    I will continue to follow [name] for AYA-appropriate support. [name] has my contact information and has been encouraged to contact me as needed.     07/14/2018     [name], LCSWA  Adolescent and Young Adult Clinical Social Worker  Pager: [phone]  Available by page to conduct virtual care due to COVID-19

## 2018-07-15 NOTE — Unmapped (Signed)
Adolescent and Young Adult Cancer Program Visit   Comprehensive Cancer Support Program     ALL AYA VISITS ARE BEING CONDUCTED REMOTELY VIA PHONE OR VIDEO.  THE AYA TEAM IS OFFSITE AND REMAINS AVAILABLE VIRTUALLY Monday - Friday from 8am - 5pm.    Service date: Jul 15, 2018    Encounter type: Virtual - text message, attempted video call  ??  Clinician:??[name], LCSWA  ??  Patient identifiers:??Sheri [name]??[name]??is a 23 y.o.??with newly diagnosed breast cancer and a history of panic disorder and PTSD.??Bralynn Velador??uses she/her/hers??pronouns.??    VISIT SUMMARY     Notes: Attempted video visit as discussed yesterday. No response, texted to ask if she was available, no response.    Plan: Will follow up this week. Plan to make a referral to [facility] but hope to connect with [name] first to provide warm handoff/introduce program.    I will continue to follow Santa for AYA-appropriate support. Breezie has my contact information and has been encouraged to contact me as needed.     07/15/2018     [name], LCSWA  Adolescent and Young Adult Clinical Social Worker  Pager: [phone]  Available by page to conduct virtual care due to COVID-19

## 2018-07-15 NOTE — Unmapped (Signed)
Clinical Pharmacist Practitioner: Breast Oncology Clinic    Oral Chemotherapy Follow-Up    Assessment and recommendations:  1) Breast cancer: tolerating capecitabine well with minimal ADRs.   - Continue capecitabine 1500mg  PO BID D1-14/21 cycle    2) Hand-foot syndrome: skin peeling on feet, using moisturizer multiple times a day, has started after her increase in exercise  - Recommend taking breaks between walks rather than walking for several miles in a row  - Recommend using looser fitting shoes and socks  - Consider use of udderly smooth cream instead of current moisturizer    3) Diarrhea: having daily but well controlled with imodium  - Continue loperamide PO q6h PRN diarrhea    4) Mucositis: had some with cycle #2, not currently experiencing any symptoms  - Continue baking soda and table salt risk QID    Follow- up: 6/9 with [name]    ______________________________________________________________________    Oral Chemotherapy: Capecitabine      Start date: 06/18/2018    HPI:   Ms. Sheri [name] is a 23 y/o F with cT2NX grade 3 IDC of the left breast; ER+ (5%), PR-, HER2-. She underwent neoadjuvant chemo with ddAC+T and surgery. Given the residual disease at time of surgery, was started on adjuvant capecitabine per the CREATE-X trial for 6 months.     Interim History:   Ms. [name] is doing well with her capecitabine treatment with some mild side effects. She has been noticing skin peeling on the bottom of her feet the last several days, but also reports that she has been trying to get more excise in as well. She has been walking several miles every day, and these symptoms have not had any impact on her walking. She continues to use moisturizer on her hands and her feet. She also reports having daily diarrhea (1-2 BM/day), but resolves with use of PRN imodium and is not bothersome.     She stated that she had some burning of the mouth during the second cycle, but has not noticed any of these symptoms at this time. She continues to use the salt and soda rinse prophylactically. She denies fatigue or any symptoms of acute illness including fever, chills, malaise, cough, or abdominal pain.     Adherence: No missed doses  Stop C2 two days early given skin boil on inner thigh per provider.     Toxicities: Hand-foot syndrome (Grade 1), diarrhea (Grade 1).     Drug-Drug Interactions: none    Other issues discussed: Patient using alprazolam for sleep and lorazepam for anxiety. Follows with [facility] [place] [facility] psychiatry, recommended she follow-up with them to consolidate to a single benzodiazepine agent if possible.     Medications:  Current Outpatient Medications   Medication Sig Dispense Refill   ??? acetaminophen (TYLENOL) 325 MG tablet Take 325 mg by mouth every six (6) hours as needed for pain.     ??? capecitabine (XELODA) 500 MG tablet Take 3 tablets (1,500 mg total) by mouth Two (2) times a day . 14 days on then 7 days off , then repeat. 84 tablet 8   ??? ibuprofen (MOTRIN) 800 MG tablet Take 1 tablet (800 mg total) by mouth every eight (8) hours as needed for pain. 70 tablet 2   ??? LORazepam (ATIVAN) 0.5 MG tablet Take one-half to one tablet every 6 hours as needed for nausea/vomiting 30 tablet 0   ??? pantoprazole (PROTONIX) 40 MG tablet Take 1 tablet (40 mg total) by mouth daily. 30 tablet 3   ???  sertraline (ZOLOFT) 100 MG tablet Take 1.5 tablets (150 mg total) by mouth daily. 45 tablet 1     No current facility-administered medications for this visit.        Approximate time spent on the phone with patient: 20 minutes    Nicholos Johns, PharmD   PGY-2 Oncology Pharmacy Resident  Pager: [phone]    Raelene Bott, PharmD, BCOP, CPP

## 2018-07-17 ENCOUNTER — Ambulatory Visit: Admit: 2018-07-17 | Discharge: 2018-07-18

## 2018-07-17 DIAGNOSIS — C50912 Malignant neoplasm of unspecified site of left female breast: Principal | ICD-10-CM

## 2018-07-17 MED FILL — PANTOPRAZOLE 40 MG TABLET,DELAYED RELEASE: 30 days supply | Qty: 30 | Fill #1 | Status: AC

## 2018-07-17 MED FILL — PANTOPRAZOLE 40 MG TABLET,DELAYED RELEASE: ORAL | 30 days supply | Qty: 30 | Fill #1

## 2018-07-17 NOTE — Unmapped (Signed)
Patient just received shipment on 5/12 and didn't start taking those til this week, only wants Pantoprazole filled and a callback later.

## 2018-07-17 NOTE — Unmapped (Signed)
Adolescent and Young Adult Cancer Program Visit   Comprehensive Cancer Support Program     ALL AYA VISITS ARE BEING CONDUCTED REMOTELY VIA PHONE OR VIDEO.  THE AYA TEAM IS OFFSITE AND REMAINS AVAILABLE VIRTUALLY Monday - Friday from 8am - 5pm.    Service date: Jul 16, 2018    Encounter type: Virtual - text message  ??  Clinician:??[name], LCSWA  ??  Patient identifiers:??Sheri [name]??Sheri [name]??is a 23 y.o.??with newly diagnosed breast cancer and a history of panic disorder and PTSD.??Sheri [name]??uses she/her/hers??pronouns.??    VISIT SUMMARY     Notes: Reached out to Sheri [name] via text to follow up from missed appointment yesterday and assess for needs. No response.      Plan: Will call tomorrow morning.    I will continue to follow Sheri [name] for AYA-appropriate support. Sheri [name] has my contact information and has been encouraged to contact me as needed.     07/16/2018     [name], LCSWA  Adolescent and Young Adult Clinical Social Worker  Pager: [phone]  Available by page to conduct virtual care due to COVID-19

## 2018-07-17 NOTE — Unmapped (Signed)
CC:     HPI:  Sheri [name]??is here today as a post op check after NSM??and placement of a prepectoral TE 700 mL on 4/3. ??100 mL fill intraoperatively.????Her ablative surgery??was??with Dr Dellis Anes. ??She noted some tightness after the last expansion but this was tolerable and resolved after 24 hours. ??She notes in the left upper arm some tightness, not in the axilla. ??She denies heaviness in the arm itself and no edema that she notes.  ??  She felt she over did it this week, and while getting out of a chair she felt a pop, and felt that the expander may have rotated.  She denies pain.        Past Medical History:  Past Medical History:   Diagnosis Date   ??? Anxiety    ??? Depression    ??? Malignant neoplasm of upper-outer quadrant of left breast in female, estrogen receptor negative (CMS-HCC) 12/10/2017   ??? Panic disorder    ??? PTSD (post-traumatic stress disorder)        Past Surgical History:  Past Surgical History:   Procedure Laterality Date   ??? BREAST BIOPSY Left 2019    malignant   ??? CHEMOTHERAPY Left 11/2017    May 13 2018   ??? IR INSERT PORT AGE GREATER THAN 5 YRS  12/18/2017    IR INSERT PORT AGE GREATER THAN 5 YRS 12/18/2017 [name], MD IMG VIR [facility]   ??? PR BREAST RECONSTRUC W TISS [facility] Left 05/30/2018    Procedure: BREAST RECON IMMED/DELAY W/EXPANDR Shari Heritage EXPA;  Surgeon: Arsenio Katz, MD;  Location: ASC OR [facility];  Service: Plastics   ??? PR BX/REMV,LYMPH NODE,DEEP AXILL Left 05/30/2018    Procedure: BX/EXC LYMPH NODE; OPEN, DEEP AXILRY NODE;  Surgeon: Talbert Cage, DO;  Location: ASC OR [facility];  Service: Surgical Oncology Breast   ??? PR IMPLNT BIO IMPLNT FOR SOFT TISSUE REINFORCEMENT Left 05/30/2018    Procedure: IMPLANTATION BIOLOGIC IMPLANT(EG, ACELLULAR DERMAL MATRIX) FOR SOFT TISSUE REINFORCEMENT(EG, BREAST, TRUNK);  Surgeon: Arsenio Katz, MD;  Location: ASC OR [facility];  Service: Plastics   ??? PR INSERT BREAST PROS IMMED AFTER EXCIS Left 05/30/2018    Procedure: IMMED INSRT BREAST PROSTH AFTER MASTOPEX/MASTECT;  Surgeon: Arsenio Katz, MD;  Location: ASC OR [facility];  Service: Plastics   ??? PR INTRAOPERATIVE SENTINEL LYMPH NODE ID W DYE INJECTION Left 05/30/2018    Procedure: INTRAOPERATIVE IDENTIFICATION SENTINEL LYMPH NODE(S) INCLUDE INJECTION NON-RADIOACTIVE DYE, WHEN PERFORMED;  Surgeon: Talbert Cage, DO;  Location: ASC OR [facility];  Service: Surgical Oncology Breast   ??? PR MASTECTOMY, SIMPLE, COMPLETE Left 05/30/2018    Procedure: Urgent MASTECTOMY, SIMPLE, COMPLETE;  Surgeon: Talbert Cage, DO;  Location: ASC OR [facility];  Service: Surgical Oncology Breast       Medications:  Current Outpatient Medications   Medication Sig Dispense Refill   ??? capecitabine (XELODA) 500 MG tablet Take 3 tablets (1,500 mg total) by mouth Two (2) times a day . 14 days on then 7 days off , then repeat. 84 tablet 8   ??? LORazepam (ATIVAN) 0.5 MG tablet Take one-half to one tablet every 6 hours as needed for nausea/vomiting 30 tablet 0   ??? pantoprazole (PROTONIX) 40 MG tablet Take 1 tablet (40 mg total) by mouth daily. 30 tablet 3   ??? sertraline (ZOLOFT) 100 MG tablet Take 1.5 tablets (150 mg total) by mouth daily. 45 tablet 1   ??? acetaminophen (TYLENOL) 325 MG tablet Take 325 mg by  mouth every six (6) hours as needed for pain.     ??? ALPRAZolam (XANAX) 0.25 MG tablet Take 0.25 mg by mouth nightly as needed for sleep.     ??? ibuprofen (MOTRIN) 800 MG tablet Take 1 tablet (800 mg total) by mouth every eight (8) hours as needed for pain. 70 tablet 2   ??? loperamide (IMODIUM) 2 mg capsule Take 2 mg by mouth 4 (four) times a day as needed.       No current facility-administered medications for this visit.        Allergies:  Allergies   Allergen Reactions   ??? Amoxicillin Other (See Comments)     EDEMA OF FACE       Family History:   Family History   Problem Relation Age of Onset   ??? Skin cancer Mother    ??? Breast cancer Neg Hx    ??? Colon cancer Neg Hx    ??? Endometrial cancer Neg Hx    ??? Ovarian cancer Neg Hx     The patient reports no family history for bleeding disorders or anesthetic problems.    Social History:  Social History     Socioeconomic History   ??? Marital status: Single     Spouse name: Not on file   ??? Number of children: Not on file   ??? Years of education: Not on file   ??? Highest education level: Not on file   Occupational History   ??? Not on file   Social Needs   ??? Financial resource strain: Not on file   ??? Food insecurity     Worry: Not on file     Inability: Not on file   ??? Transportation needs     Medical: Not on file     Non-medical: Not on file   Tobacco Use   ??? Smoking status: Never Smoker   ??? Smokeless tobacco: Never Used   Substance and Sexual Activity   ??? Alcohol use: Not Currently   ??? Drug use: Never   ??? Sexual activity: Not on file   Lifestyle   ??? Physical activity     Days per week: Not on file     Minutes per session: Not on file   ??? Stress: Not on file   Relationships   ??? Social Wellsite geologist on phone: Not on file     Gets together: Not on file     Attends religious service: Not on file     Active member of club or organization: Not on file     Attends meetings of clubs or organizations: Not on file     Relationship status: Not on file   Other Topics Concern   ??? Not on file   Social History Narrative    The patient is single.  She lives at home with her mother and brother       ROS:   Otherwise, 12 point review of systems was completed and is negative except as per HPI.      Vitals:   Vitals:    07/10/18 1346   BP: 140/94   Pulse: 84   Resp: 15   Temp: 36.4 ??C (97.6 ??F)   SpO2: 96%     ?  Physical Exam:   Gen: AA in NAD  HEENT: NCAT, EOMI, PERRL, Non icteric sclerae, MMM  CV: RRR  RESP: quiet respirations on room air  EXT: warm and well perfused  Breast:  right native breast no obvious abnormalities. ??Left breast expander appears in appropriate position. ??  ??  PROCEDURE:??The port was identified with a magnet, and marked. ??This was cleansed with chloroprep. ??The 22G butterfly needle was used to access the port after the prep dried fully. ??Hard metal backing was felt, and the blue dye aspirated.????100??mL was instilled without incident. ??Skin check was normal and there was no discomfort for the patient.????    ?  Impression and Plan:  Sheri [name]??is here today as a post op check after NSM??and placement of a prepectoral TE 700 mL on 4/3. ??100 mL fill intraoperatively. ??Doing well overall.  -Continue restrictions  -RTC one week for skin check  -TE??fill +100:??total??800/700??    No follow-ups on file.

## 2018-07-17 NOTE — Unmapped (Signed)
CC:     HPI:  Sheri [name]??is here today as a post op check after NSM??and placement of a prepectoral TE 700 mL on 4/3. ??100 mL fill intraoperatively.????Her ablative surgery??was??with Dr Dellis Anes. ??She noted some tightness after the last expansion but this was tolerable and resolved after 24 hours. ??She notes in the left upper arm some tightness, not in the axilla. ??She denies heaviness in the arm itself and no edema that she notes.  ??  Had some pain following last expansion, but is doing well since. Resolved about 2 days following expansion. Feels like she would still like to be larger, but also acknowledges 50 lb weight gain during treatment and plans to lose this excess weight.      Past Medical History:  Past Medical History:   Diagnosis Date   ??? Anxiety    ??? Depression    ??? Malignant neoplasm of upper-outer quadrant of left breast in female, estrogen receptor negative (CMS-HCC) 12/10/2017   ??? Panic disorder    ??? PTSD (post-traumatic stress disorder)        Past Surgical History:  Past Surgical History:   Procedure Laterality Date   ??? BREAST BIOPSY Left 2019    malignant   ??? CHEMOTHERAPY Left 11/2017    May 13 2018   ??? IR INSERT PORT AGE GREATER THAN 5 YRS  12/18/2017    IR INSERT PORT AGE GREATER THAN 5 YRS 12/18/2017 [name], MD IMG VIR [facility]   ??? PR BREAST RECONSTRUC W TISS [facility] Left 05/30/2018    Procedure: BREAST RECON IMMED/DELAY W/EXPANDR Shari Heritage EXPA;  Surgeon: Arsenio Katz, MD;  Location: ASC OR [facility];  Service: Plastics   ??? PR BX/REMV,LYMPH NODE,DEEP AXILL Left 05/30/2018    Procedure: BX/EXC LYMPH NODE; OPEN, DEEP AXILRY NODE;  Surgeon: Talbert Cage, DO;  Location: ASC OR [facility];  Service: Surgical Oncology Breast   ??? PR IMPLNT BIO IMPLNT FOR SOFT TISSUE REINFORCEMENT Left 05/30/2018    Procedure: IMPLANTATION BIOLOGIC IMPLANT(EG, ACELLULAR DERMAL MATRIX) FOR SOFT TISSUE REINFORCEMENT(EG, BREAST, TRUNK);  Surgeon: Arsenio Katz, MD;  Location: ASC OR [facility];  Service: Plastics   ??? PR INSERT BREAST PROS IMMED AFTER EXCIS Left 05/30/2018    Procedure: IMMED INSRT BREAST PROSTH AFTER MASTOPEX/MASTECT;  Surgeon: Arsenio Katz, MD;  Location: ASC OR [facility];  Service: Plastics   ??? PR INTRAOPERATIVE SENTINEL LYMPH NODE ID W DYE INJECTION Left 05/30/2018    Procedure: INTRAOPERATIVE IDENTIFICATION SENTINEL LYMPH NODE(S) INCLUDE INJECTION NON-RADIOACTIVE DYE, WHEN PERFORMED;  Surgeon: Talbert Cage, DO;  Location: ASC OR [facility];  Service: Surgical Oncology Breast   ??? PR MASTECTOMY, SIMPLE, COMPLETE Left 05/30/2018    Procedure: Urgent MASTECTOMY, SIMPLE, COMPLETE;  Surgeon: Talbert Cage, DO;  Location: ASC OR [facility];  Service: Surgical Oncology Breast       Medications:  Current Outpatient Medications   Medication Sig Dispense Refill   ??? acetaminophen (TYLENOL) 325 MG tablet Take 325 mg by mouth every six (6) hours as needed for pain.     ??? ALPRAZolam (XANAX) 0.25 MG tablet Take 0.25 mg by mouth nightly as needed for sleep.     ??? capecitabine (XELODA) 500 MG tablet Take 3 tablets (1,500 mg total) by mouth Two (2) times a day . 14 days on then 7 days off , then repeat. 84 tablet 8   ??? ibuprofen (MOTRIN) 800 MG tablet Take 1 tablet (800 mg total) by mouth every eight (8) hours as needed for  pain. 70 tablet 2   ??? loperamide (IMODIUM) 2 mg capsule Take 2 mg by mouth 4 (four) times a day as needed.     ??? LORazepam (ATIVAN) 0.5 MG tablet Take one-half to one tablet every 6 hours as needed for nausea/vomiting 30 tablet 0   ??? pantoprazole (PROTONIX) 40 MG tablet Take 1 tablet (40 mg total) by mouth daily. 30 tablet 3   ??? sertraline (ZOLOFT) 100 MG tablet Take 1.5 tablets (150 mg total) by mouth daily. 45 tablet 1     No current facility-administered medications for this visit.        Allergies:  Allergies   Allergen Reactions   ??? Amoxicillin Other (See Comments)     EDEMA OF FACE       Family History:   Family History   Problem Relation Age of Onset   ??? Skin cancer Mother    ??? Breast cancer Neg Hx    ??? Colon cancer Neg Hx    ??? Endometrial cancer Neg Hx    ??? Ovarian cancer Neg Hx     The patient reports no family history for bleeding disorders or anesthetic problems.    Social History:  Single  Never smoker  Denies EtOH use    ROS:   Otherwise, 12 point review of systems was completed and is negative except as per HPI.      Vitals:   Vitals:    07/17/18 1428   BP: 132/77   Pulse: 114   Resp: 18   Temp: 35.7 ??C   SpO2: 97%     ?  Physical Exam:   Gen: AA in NAD  HEENT: NCAT, EOMI, PERRL, Non icteric sclerae, MMM  CV: RRR  RESP: quiet respirations on room air  EXT: warm and well perfused  Breast: right native breast no obvious abnormalities. ??Left breast expander appears in appropriate position. ??    ?  Impression and Plan:  Sheri [name]??is here today after NSM??and placement of a prepectoral TE 700 mL on 4/3. ??Doing well overall.  -Continue restrictions  -TE total??800/700??    No follow-ups on file.
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
## 2018-07-29 NOTE — Unmapped (Signed)
Adolescent and Young Adult Cancer Program Visit   Comprehensive Cancer Support Program     ALL AYA VISITS ARE BEING CONDUCTED REMOTELY VIA PHONE OR VIDEO.  THE AYA TEAM IS OFFSITE AND REMAINS AVAILABLE VIRTUALLY Monday - Friday from 8am - 5pm.    Service date: July 29, 2018    Encounter type: Virtual - telephone call and text message exchange  ??  Clinician: [name], LCSWA  ??  Patient identifiers:??Zonnique??[name]??is a 23 y.o.??with newly diagnosed breast cancer and a history of panic disorder and PTSD.??Arelis Neumeier??uses she/her/hers??pronouns.??    VISIT SUMMARY     Notes: Attempted scheduled non-Epic video visit. No answer. Sent text, no response.      Plan: Will contact next week if no response received by then.    I will continue to follow [place] for AYA-appropriate support. Demisha has my contact information and has been encouraged to contact me as needed.     07/29/2018     [name], LCSWA  Adolescent and Young Adult Clinical Social Worker  Pager: [phone]  Available by page to conduct virtual care due to COVID-19

## 2018-07-30 NOTE — Unmapped (Signed)
[facility] Refill Coordination Note    Specialty Medication(s) to be Shipped:   Hematology/Oncology: Capecitabine 500mg     Other medication(s) to be shipped:   -na      Sheri [name], Sheri [name]  DOB: April 19, 1995  Phone: [phone] (home)   Shipping Address: [address]  [facility] [place] [address]    All above HIPAA information was verified with patient.     Completed refill call assessment today to schedule patient's medication shipment from the [facility] [phone]).       Specialty medication(s) and dose(s) confirmed: Regimen is correct and unchanged.   Changes to medications: Sheri [name] reports no changes reported at this time.  Changes to insurance: No  Questions for the pharmacist: No    Confirmed patient received Welcome Packet with first shipment. The patient will receive a drug information handout for each medication shipped and additional FDA Medication Guides as required.       DISEASE/MEDICATION-SPECIFIC INFORMATION        N/A        SPECIALTY MEDICATION ADHERENCE     Medication Adherence    Patient reported X missed doses in the last month:  >5  Specialty Medication:  capecitabine 500mg  ( 14 days)   Patient is on additional specialty medications:  No  Informant:  patient  Reasons for non-adherence:  patient forgets   Other non-adherence reason:  sleeping schedule has been off, haedaches    Confirmed plan for next specialty medication refill:  delivery by pharmacy  Refills needed for supportive medications:  not needed          Refill Coordination    Has the Patients' Contact Information Changed:  No  Is the Shipping Address Different:  No         Adverse Effects    Headaches:  Pos               Capecitabine  500 mg: 14 days of medicine on hand         ADDITIONAL NOTES         patient has missed more than 5 doses of medication in the last month. She states her sleeping schedule has changed. She is also experiencing headaches.         SHIPPING     Shipping Information Delivery Scheduled:  Yes  Delivery Date:  08/05/18         Shipping address confirmed in Epic.     Delivery Scheduled: Yes, Expected medication delivery date: 6/9.     Medication will be delivered via UPS to the home address in Epic WAM.    Jolene Schimke   [facility] Specialty Technician

## 2018-08-04 MED FILL — CAPECITABINE 500 MG TABLET: 14 days supply | Qty: 84 | Fill #2 | Status: AC

## 2018-08-04 MED FILL — CAPECITABINE 500 MG TABLET: ORAL | 14 days supply | Qty: 84 | Fill #2

## 2018-08-05 NOTE — Unmapped (Signed)
Adolescent and Young Adult Cancer Program Visit   Comprehensive Cancer Support Program     MOST AYA VISITS ARE BEING CONDUCTED REMOTELY VIA PHONE OR VIDEO.  THE AYA TEAM IS OFFSITE AND REMAINS AVAILABLE VIRTUALLY Monday - Thursday from 8am - 5pm. Available in-person on Fridays 8am - 5pm.    Service date: August 05, 2018    Encounter type: Virtual - telephone call and text message exchange    Clinician: [name], [place]    Patient identifiers:??Sheri [name]??Sheri [name]??is a 23 y.o.??with newly diagnosed breast cancer and a history of panic disorder and PTSD.??Sheri [name]??uses she/her/hers??pronouns.??    VISIT SUMMARY     Notes: Called following missed appointments with primary team. No answer; mailbox full. Sent text message asking for a reply.      Plan: Will continue to attempt contact to assess for safety and other needs.    I will continue to follow Sheri [name] for AYA-appropriate support. Sheri [name] has my contact information and has been encouraged to contact me as needed.     08/05/2018     [name], LCSWA  Adolescent and Young Adult Clinical Social Worker  Pager: [phone]  Available by page to conduct virtual care due to COVID-19

## 2018-08-05 NOTE — Unmapped (Deleted)
Pt did NOT answer TC and VM was full so no message was left
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
## 2018-08-06 NOTE — Unmapped (Signed)
Patient did not show for our scheduled appointment today. I attempted on multiple tries to get up with her by phone, but she never answered. Unable to leave a voice mail message because her mailbox is full. Left her an email and text. I did let the other providers on the team know.

## 2018-08-25 MED FILL — PANTOPRAZOLE 40 MG TABLET,DELAYED RELEASE: 30 days supply | Qty: 30 | Fill #2 | Status: AC

## 2018-08-25 MED FILL — PANTOPRAZOLE 40 MG TABLET,DELAYED RELEASE: ORAL | 30 days supply | Qty: 30 | Fill #2

## 2018-08-26 NOTE — Unmapped (Signed)
[facility] Refill Coordination Note    Specialty Medication(s) to be Shipped:   Hematology/Oncology: Xeloda 500mg     Other medication(s) to be shipped: none     Sheri [name], DOB: 1995-12-31  Phone: [phone] (home)       All above HIPAA information was verified with patient.     Completed refill call assessment today to schedule patient's medication shipment from the [facility] [phone]).       Specialty medication(s) and dose(s) confirmed: Regimen is correct and unchanged.   Changes to medications: Sheri [name] reports no changes at this time.  Changes to insurance: No  Questions for the pharmacist: No    Confirmed patient received Welcome Packet with first shipment. The patient will receive a drug information handout for each medication shipped and additional FDA Medication Guides as required.       DISEASE/MEDICATION-SPECIFIC INFORMATION        N/A    SPECIALTY MEDICATION ADHERENCE     Medication Adherence    Patient reported X missed doses in the last month:  0  Specialty Medication:  Xeloda  Patient is on additional specialty medications:  No                Xeloda 500 mg: 3 days of medicine on hand          SHIPPING     Shipping address confirmed in Epic.     Delivery Scheduled: Yes, Expected medication delivery date: 08/28/18.     Medication will be delivered via UPS to the home address in Epic WAM.    Unk Lightning    [facility] Specialty Technician

## 2018-08-27 MED FILL — CAPECITABINE 500 MG TABLET: ORAL | 14 days supply | Qty: 84 | Fill #3

## 2018-08-27 MED FILL — CAPECITABINE 500 MG TABLET: 14 days supply | Qty: 84 | Fill #3 | Status: AC

## 2018-08-29 NOTE — Unmapped (Signed)
Patient Sheri [name] was contacted today regarding an appointment with Dr. Avis Epley' office. Unable to leave a voicemail.

## 2018-09-05 NOTE — Unmapped (Signed)
Phone Visit  Patient approved appointment change: yes  Patient demographics and insurance updated: yes  MSPQ complete: Yes  Instructions provided for phone visit: yes

## 2018-09-09 ENCOUNTER — Institutional Professional Consult (permissible substitution): Admit: 2018-09-09 | Discharge: 2018-09-10 | Attending: Adult Health | Primary: Adult Health

## 2018-09-09 NOTE — Unmapped (Signed)
Hi Nurse Delorise Shiner contacted the Communication Center requesting to speak with the care team of Tierria Watson to discuss:    Received a [facility] call that was disconnected. Were you contacting her for her appointment?    Please contact Ms. Ridge at [phone].        Check Indicates criteria has been reviewed and confirmed with the patient:    []  Preferred Name   []  DOB and/or MR#  []  Preferred Contact Method  []  Phone Number(s)   []  MyChart     Thank you,   Kelli Hope  [facility]   [phone]

## 2018-09-09 NOTE — Unmapped (Signed)
Virtual visit confirmed...  Patient need refills on ativan.
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
## 2018-09-10 NOTE — Unmapped (Signed)
Patient is scheduled for surgery,     Date: ??Tuesday, 10/07/2018   Location: [facility] [place]   ??   Please arrange pre-operative COVID testing.   Thanks, Silvestre Mesi   ________________________________________    Patient was called to schedule Pre-Test for <October 04, 2018>    Did patient confirm appointment? Yes

## 2018-09-11 ENCOUNTER — Ambulatory Visit: Admit: 2018-09-11 | Discharge: 2018-09-12 | Attending: Family | Primary: Family

## 2018-09-11 DIAGNOSIS — Z20828 Contact with and (suspected) exposure to other viral communicable diseases: Principal | ICD-10-CM

## 2018-09-11 NOTE — Unmapped (Signed)
Spoke to patient scheduled appt for today @ 1:45 pm at the [facility] location. Patient voiced understanding.

## 2018-09-11 NOTE — Unmapped (Signed)
Assessment     Sheri [name] is a 23 y.o. female presenting to [facility] for COVID testing.     Plan     If no testing performed, pt counseled on routine care for respiratory illness.  If testing performed, COVID sent.  Patient directed to Home given findings during today's visit.    Subjective     Sheri [name] is a 23 y.o. female who [facility] with complaints of the following:    Exposure History: In the last 21 days?     Have you traveled outside of [place]? No               Have you been in close contact with someone confirmed by a test to have COVID? (Close contact is within 6 feet for at least 10 minutes) No       Have you worked in a health care facility? No     Lived or worked facility like a nursing home, group home, or assisted living?    No         Are you scheduled to have surgery or a procedure in the next 3 days? No               Are you scheduled to receive cancer chemotherapy within the next 7 days?    Yes     Have you ever been tested before for COVID-19 with a swab of your nose? No   Are you a healthcare worker being tested so to return to work No         Right now,  do you have any of the following that developed over the past 7 days (as stated by patient on intake form):    Subjective fever (felt feverish) Yes, how many days? 1   Chills (especially repeated shaking chills) Yes, how many days? 2   Severe fatigue (felt very tired) Yes, how many days? 5   Muscle aches Yes, how many days? 4   Runny nose Yes, how many days? 7   Sore throat Yes, how many days? 14   Loss of taste or smell No   Cough (new onset or worsening of chronic cough) Yes, how many days? 10   Shortness of breath Yes, how many days? 2   Nausea or vomiting No   Headache Yes, how many days? 3   Abdominal Pain Yes, how many days? 2   Diarrhea (3 or more loose stools in last 24 hours) Yes, how many days? 3     History/Medical Conditions (as stated by patient on intake form):    Do you have any of the following:   Asthma or emphysema or COPD No   Cystic Fibrosis No   Diabetes No   High Blood Pressure  No   Cardiovascular Disease No   Chronic Kidney Disease No   Chronic Liver Disease No   Chronic blood disorder like Sickle Cell Disease  No   Weak immune system due to disease or medication Yes   Neurologic condition that limits movement  No   Developmental delay - Moderate to Severe  No   Recent (within past 2 weeks) or current Pregnancy No   Morbid Obesity (>100 pounds over ideal weight) Yes   Current Smoker No   Former Smoker Yes       Objective     Given above, testing performed: Yes    Testing Performed:  Test Specimen Type  Sent to   COVID-19  [facility] [place] [facility]       Scribe's Attestation: Paulita Fujita, FNP obtained and performed the history, physical exam and medical decision making elements that were entered into the chart.  Signed by Alleen Borne serving as Scribe, on 09/11/2018 2:39 PM      The documentation recorded by the scribe accurately reflects the service I personally performed and the decisions made by me. Aida Puffer, FNP  September 12, 2018 5:27 PM

## 2018-09-11 NOTE — Unmapped (Signed)
Outpatient Sw Note - Telephone Call     Sw called patient at request of [name] to offer support and financial support.   Patient stated she was out with her grandmother and asked if Sw could call her back at 3:30.  Sw agreed to call her then.      Sw spoke with the patient and she stated one of her main issues right now is that she has high co-pays with her PCP (seeing PCP for her anti-anxiety meds).  She is unable to utilize our [facility] providers as they are seeing patients virtually with telehealth visits and she is unable to do this at her home.  She stated that her PCP is also leaving and she needs a new provider.  Sw explained that if she used a Clinical cytogeneticist, the co-pay with her [facility] would only be $10 and she could also get her medications for free at the [facility].  Patient stated that she would like to do this and Sw gave her the phone number for [facility].    The patient has not been able to work through her treatments, so she has no income coming in.  She said she lives with her mother and brother.  Sw explained that the [employer] currently has a grant for food and nutritional needs.  Patient would appreciate Sw applying for this on her behalf.  Patient gave verbal consent for Sw to release medical, financial, and treatment information for purposes of financial assistance to PA[employer].     Patient already receives [employer] gas cards.  Sw explained that [employer] may be able to help with a utility or phone bill, but patient stated that her mother is currently paying those bills.     Sw gave patient her contact information and encouraged her to call with any questions/concerns.      [name], MSW, LCSW

## 2018-09-11 NOTE — Unmapped (Signed)
-----   Message from Sheran Spine sent at 09/11/2018  9:02 AM EDT -----  Regarding: FW: Referral to [facility] from Virtual Care Provider    ----- Message -----  From: Valentina Lucks, NP  Sent: 09/11/2018   8:55 AM EDT  To: [facility] Administrative Assistant Staff  Subject: Referral to [facility] from Virtual Care Provider       Symptomatic patient, no exam needed, drive-through only.    Patient with h/o breast cancer and was taking adjuvant oral chemotherapy.  2 week history of cough, sore throat, and now chills.      Thanks!   [name], NP

## 2018-09-12 NOTE — Unmapped (Signed)
Sw completed and submitted the PA[employer] application.  Patient was approved for the $300 grant.    [name], MSW, LCSW

## 2018-09-16 NOTE — Unmapped (Signed)
Patient did not show for our scheduled telephone visit today. I tried to contact her multiple times and she would not answer her phone. Unable to leave a voice mail message, her mailbox was full.

## 2018-09-17 MED ORDER — PANTOPRAZOLE 40 MG TABLET,DELAYED RELEASE
ORAL_TABLET | Freq: Every day | ORAL | 3 refills | 30 days | Status: CP
Start: 2018-09-17 — End: 2019-09-17
  Filled 2018-09-22: qty 90, 90d supply, fill #0

## 2018-09-19 NOTE — Unmapped (Signed)
Denies refills for now having surgery and off medication till after that

## 2018-09-22 ENCOUNTER — Ambulatory Visit: Admit: 2018-09-22 | Discharge: 2018-09-23 | Attending: Family | Primary: Family

## 2018-09-22 ENCOUNTER — Ambulatory Visit: Admit: 2018-09-22 | Discharge: 2018-09-23

## 2018-09-22 DIAGNOSIS — Z45812 Encounter for adjustment or removal of left breast implant: Principal | ICD-10-CM

## 2018-09-22 DIAGNOSIS — Z1159 Encounter for screening for other viral diseases: Principal | ICD-10-CM

## 2018-09-22 MED FILL — PANTOPRAZOLE 40 MG TABLET,DELAYED RELEASE: 90 days supply | Qty: 90 | Fill #0 | Status: AC

## 2018-09-22 NOTE — Unmapped (Signed)
COVID Pre-Procedure Intake Form     Assessment     Sheri [name] is a 23 y.o. female presenting to [facility] for COVID testing.     Plan     If no testing performed, pt counseled on routine care for respiratory illness.  If testing performed, COVID sent.  Patient directed to Home given findings during today's visit.    Subjective     Sheri [name] is a 23 y.o. female who [facility] with complaints of the following:    Exposure History: In the last 21 days?     Have you traveled outside of [place]? No               Have you been in close contact with someone confirmed by a test to have COVID? (Close contact is within 6 feet for at least 10 minutes) No       Have you worked in a health care facility? No     Lived or worked facility like a nursing home, group home, or assisted living?    No         Are you scheduled to have surgery or a procedure in the next 3 days? Yes               Are you scheduled to receive cancer chemotherapy within the next 7 days?    No     Have you ever been tested before for COVID-19 with a swab of your nose? Yes: When: last week, Where: [facility] (chart confirms 7/16)   Are you a healthcare worker being tested so to return to work No     Right now,  do you have any of the following that developed over the past 7 days (as stated by patient on intake form):    Subjective fever (felt feverish) No   Chills (especially repeated shaking chills) No   Severe fatigue (felt very tired) No   Muscle aches No   Runny nose No   Sore throat No   Loss of taste or smell No   Cough (new onset or worsening of chronic cough) No   Shortness of breath No   Nausea or vomiting No   Headache No   Abdominal Pain No   Diarrhea (3 or more loose stools in last 24 hours) No       Scribe's Attestation: Teea Ducey L. Xzayvier Fagin, FNP-c obtained and performed the history, physical exam and medical decision making elements that were entered into the chart.  Signed by Mal Amabile, LCSW serving as Scribe, on 09/22/2018 10:52 AM      The documentation recorded by the scribe accurately reflects the service I personally performed and the decisions made by me. Aida Puffer, FNP  September 22, 2018 7:12 PM

## 2018-09-23 DIAGNOSIS — T8543XA Leakage of breast prosthesis and implant, initial encounter: Principal | ICD-10-CM

## 2018-09-23 NOTE — Unmapped (Signed)
Phone Visit  Patient approved appointment change: yes  Patient demographics and insurance updated: yes  MSPQ complete: Yes  Instructions provided for phone visit: yes

## 2018-09-24 ENCOUNTER — Encounter
Admit: 2018-09-24 | Discharge: 2018-09-25 | Attending: Student in an Organized Health Care Education/Training Program | Primary: Student in an Organized Health Care Education/Training Program

## 2018-09-24 ENCOUNTER — Encounter: Admit: 2018-09-24 | Discharge: 2018-09-25 | Payer: MEDICAID

## 2018-09-24 ENCOUNTER — Ambulatory Visit: Admit: 2018-09-24 | Discharge: 2018-09-25

## 2018-09-24 ENCOUNTER — Encounter
Admit: 2018-09-24 | Discharge: 2018-09-25 | Payer: MEDICAID | Attending: Student in an Organized Health Care Education/Training Program | Primary: Student in an Organized Health Care Education/Training Program

## 2018-09-24 DIAGNOSIS — T8543XA Leakage of breast prosthesis and implant, initial encounter: Principal | ICD-10-CM

## 2018-09-24 MED ORDER — CLINDAMYCIN HCL 300 MG CAPSULE: 300 mg | capsule | Freq: Three times a day (TID) | 0 refills | 3 days | Status: AC

## 2018-09-24 MED ORDER — CLINDAMYCIN HCL 300 MG CAPSULE
ORAL_CAPSULE | Freq: Three times a day (TID) | ORAL | 0 refills | 3.00000 days | Status: CP
Start: 2018-09-24 — End: 2018-09-24
  Filled 2018-09-24: qty 9, 3d supply, fill #0

## 2018-09-24 MED ORDER — OXYCODONE 5 MG TABLET
ORAL_TABLET | ORAL | 0 refills | 3.00000 days | Status: CP | PRN
Start: 2018-09-24 — End: 2018-09-24
  Filled 2018-09-24: qty 15, 3d supply, fill #0

## 2018-09-24 MED ORDER — OXYCODONE 5 MG TABLET: 5 mg | tablet | 0 refills | 3 days | Status: AC

## 2018-09-24 MED FILL — CLINDAMYCIN HCL 300 MG CAPSULE: 3 days supply | Qty: 9 | Fill #0 | Status: AC

## 2018-09-24 MED FILL — OXYCODONE 5 MG TABLET: 3 days supply | Qty: 15 | Fill #0 | Status: AC
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
## 2018-09-25 NOTE — Unmapped (Signed)
CC:     HPI:  Sheri [name]??is here today as a post op check after NSM??and placement of a prepectoral TE 700 mL on 4/3. ??100 mL fill intraoperatively.????Her ablative surgery??was??with Dr Dellis Anes.  She was overfilled to a total of 800 mL.  She has been doing well.  She notes she has a dog who roughhouses with her and will hit her chest with its paws.  Recently in the last 1-2 weeks she has noted that the left tissue has become softer, noting concern for TE rupture.        Past Medical History:  Past Medical History:   Diagnosis Date   ??? Anxiety    ??? Depression    ??? Malignant neoplasm of upper-outer quadrant of left breast in female, estrogen receptor negative (CMS-HCC) 12/10/2017   ??? Panic disorder    ??? PTSD (post-traumatic stress disorder)        Past Surgical History:  Past Surgical History:   Procedure Laterality Date   ??? BREAST BIOPSY Left 2019    malignant   ??? CHEMOTHERAPY Left 11/2017    May 13 2018   ??? IR INSERT PORT AGE GREATER THAN 5 YRS  12/18/2017    IR INSERT PORT AGE GREATER THAN 5 YRS 12/18/2017 [name], MD IMG VIR [facility]   ??? PR BREAST RECONSTRUC W TISS [facility] Left 05/30/2018    Procedure: BREAST RECON IMMED/DELAY W/EXPANDR Shari Heritage EXPA;  Surgeon: Arsenio Katz, MD;  Location: ASC OR [facility];  Service: Plastics   ??? PR BX/REMV,LYMPH NODE,DEEP AXILL Left 05/30/2018    Procedure: BX/EXC LYMPH NODE; OPEN, DEEP AXILRY NODE;  Surgeon: Talbert Cage, DO;  Location: ASC OR [facility];  Service: Surgical Oncology Breast   ??? PR IMPLNT BIO IMPLNT FOR SOFT TISSUE REINFORCEMENT Left 05/30/2018    Procedure: IMPLANTATION BIOLOGIC IMPLANT(EG, ACELLULAR DERMAL MATRIX) FOR SOFT TISSUE REINFORCEMENT(EG, BREAST, TRUNK);  Surgeon: Arsenio Katz, MD;  Location: ASC OR [facility];  Service: Plastics   ??? PR INSERT BREAST PROS IMMED AFTER EXCIS Left 05/30/2018    Procedure: IMMED INSRT BREAST PROSTH AFTER MASTOPEX/MASTECT;  Surgeon: Arsenio Katz, MD;  Location: ASC OR [facility];  Service: Plastics   ??? PR INTRAOPERATIVE SENTINEL LYMPH NODE ID W DYE INJECTION Left 05/30/2018    Procedure: INTRAOPERATIVE IDENTIFICATION SENTINEL LYMPH NODE(S) INCLUDE INJECTION NON-RADIOACTIVE DYE, WHEN PERFORMED;  Surgeon: Talbert Cage, DO;  Location: ASC OR [facility];  Service: Surgical Oncology Breast   ??? PR MASTECTOMY, SIMPLE, COMPLETE Left 05/30/2018    Procedure: Urgent MASTECTOMY, SIMPLE, COMPLETE;  Surgeon: Talbert Cage, DO;  Location: ASC OR [facility];  Service: Surgical Oncology Breast       Medications:  Current Outpatient Medications   Medication Sig Dispense Refill   ??? acetaminophen (TYLENOL) 325 MG tablet Take 325 mg by mouth every six (6) hours as needed for pain.     ??? ALPRAZolam (XANAX) 0.25 MG tablet Take 0.25 mg by mouth nightly as needed for sleep.     ??? capecitabine (XELODA) 500 MG tablet Take 3 tablets (1,500 mg total) by mouth Two (2) times a day . 14 days on then 7 days off , then repeat. 84 tablet 8   ??? ibuprofen (MOTRIN) 800 MG tablet Take 1 tablet (800 mg total) by mouth every eight (8) hours as needed for pain. 70 tablet 2   ??? loperamide (IMODIUM) 2 mg capsule Take 2 mg by mouth 4 (four) times a day as needed.     ???  LORazepam (ATIVAN) 0.5 MG tablet Take one-half to one tablet every 6 hours as needed for nausea/vomiting 30 tablet 0   ??? pantoprazole (PROTONIX) 40 MG tablet Take 1 tablet (40 mg total) by mouth daily. 30 tablet 3   ??? sertraline (ZOLOFT) 100 MG tablet Take 1.5 tablets (150 mg total) by mouth daily. 45 tablet 1   ??? clindamycin (CLEOCIN) 300 MG capsule Take 1 capsule (300 mg total) by mouth Three (3) times a day for 3 days. 9 capsule 0   ??? oxyCODONE (ROXICODONE) 5 MG immediate release tablet Take 1 tablet (5 mg total) by mouth every four (4) hours as needed for pain for up to 5 days. 15 tablet 0     No current facility-administered medications for this visit.        Allergies:  Allergies   Allergen Reactions   ??? Amoxicillin Other (See Comments)     EDEMA OF FACE       Family History:   Family History   Problem Relation Age of Onset   ??? Skin cancer Mother    ??? Breast cancer Neg Hx    ??? Colon cancer Neg Hx    ??? Endometrial cancer Neg Hx    ??? Ovarian cancer Neg Hx     The patient reports no family history for bleeding disorders or anesthetic problems.    Social History:  Social History     Socioeconomic History   ??? Marital status: Single     Spouse name: Not on file   ??? Number of children: Not on file   ??? Years of education: Not on file   ??? Highest education level: Not on file   Occupational History   ??? Not on file   Social Needs   ??? Financial resource strain: Not on file   ??? Food insecurity     Worry: Not on file     Inability: Not on file   ??? Transportation needs     Medical: Not on file     Non-medical: Not on file   Tobacco Use   ??? Smoking status: Never Smoker   ??? Smokeless tobacco: Never Used   Substance and Sexual Activity   ??? Alcohol use: Not Currently   ??? Drug use: Never   ??? Sexual activity: Not on file   Lifestyle   ??? Physical activity     Days per week: Not on file     Minutes per session: Not on file   ??? Stress: Not on file   Relationships   ??? Social Wellsite geologist on phone: Not on file     Gets together: Not on file     Attends religious service: Not on file     Active member of club or organization: Not on file     Attends meetings of clubs or organizations: Not on file     Relationship status: Not on file   Other Topics Concern   ??? Not on file   Social History Narrative    The patient is single.  She lives at home with her mother and brother       ROS:   Otherwise, 12 point review of systems was completed and is negative except as per HPI.      Vitals:   Vitals:    09/22/18 0844   BP: 111/77   Pulse: 84   Temp: 36.7 ??C (98.1 ??F)     ?  Physical Exam:  Gen: AA in NAD  HEENT: NCAT, EOMI, PERRL, Non icteric sclerae, MMM  CV: RRR  RESP: quiet respirations on room air  EXT: warm and well perfused  Breast: right native breast with grade 2-3 ptosis.  Left breast with obviously deflated TE. No erythema.  Incision well healed.  Skin remains lax.    ?  Impression and Plan:  Sheri [name] is here today to assess for TE deflation.  -Plan for removal of TE and placement of permanent implant  -Discussed risks and benefits (bleeding, infection, surveillance of silicone implants for asymptomatic rupture, damage to surrounding structures).  Consent obtained.        No follow-ups on file.

## 2018-09-25 NOTE — Unmapped (Signed)
Unable to contact the patient about a scheduled appointment. Mailbox is full.

## 2018-09-25 NOTE — Unmapped (Signed)
Brief Operative Note  (CSN: [id number])      Date of Surgery: 09/24/2018    Pre-op Diagnosis: hisotry of breast recon    Post-op Diagnosis: S/P breast reconstruction [Z98.82]  Malignant neoplasm of left female breast, unspecified estrogen receptor status, unspecified site of breast (CMS-HCC) [C50.912]    Procedure(s):  R21 - REVISION OF RECONSTRUCTED  BREAST: 19380 (CPT??)  REPLACEMENT OF TISSUE EXPANDER WITH PERMANENT PROSTHESIS: 11970 (CPT??)  MASTOPEXY FOR SYMMETRY PURPOSES: 19316 (CPT??)  Note: Revisions to procedures should be made in chart - see Procedures activity.    Performing Service: Librarian, academic) and Role:     * Arsenio Katz, MD - Primary     * Tammy Sours, MD - Resident - Assisting    Assistant: None    Findings: left breast tissue expander    Anesthesia: General    Estimated Blood Loss: 3 mL    Complications: None    Specimens:   ID Type Source Tests Collected by Time Destination   1 : LEFT BREAST TISSUE Tissue Breast, Left SURGICAL PATHOLOGY EXAM Arsenio Katz, MD 09/24/2018 1835    A : LEFT BREAST TISSUE EXPANDER Implant Breast, Left MEDICAL DEVICE CULTURE Arsenio Katz, MD 09/24/2018 1855        Implants:   Implant Name Type Inv. Item Serial No. Manufacturer Lot No. LRB No. Used Action   TISSUE EXPANDER MV - [id number] Breast Implant or Tissue Expander TISSUE EXPANDER MV [id number] [facility] [id number] Left 1 Explanted   IMPLANT BREAST NATRELLE - [id number] Breast Implant or Tissue Expander IMPLANT BREAST NATRELLE [id number] [facility]  Left 1 Implanted       Surgeon Notes: I was present and scrubbed for the entire procedure    Tammy Sours   Date: 09/24/2018  Time: 7:12 PM

## 2018-09-25 NOTE — Unmapped (Addendum)
Date: 09/24/2018    Preoperative Diagnosis: Left breast cancer with TE expansion, now with ruptured implant  Postoperative Diagnosis: Same    Procedure Performed: Removal of left ruptured tissue expander, excision of unincorporated alloderm, Superolateral capsulotomy, excision of nodule (0.5x 0.5 cm); placement of smooth round silicone implant 800 mL SSX (SN [id number])    Staff: Lamarr Lulas, MD  Resident: Jodean Lima, MD    Anesthesia: General with local 0.25% marcaine with epinephrine    Blood Loss: Minimal    Specimen: Left tissue expander for gross only; left breast tissue    Indications: Sheri [name] is a 23 yo F with a history of left breast cancer who previously underwent mastectomy and TE placement.  She has completed her TE fills in clinic and has been overexpanded.  She had planned TE to implant exchange in 2 weeks, but about 1 week ago noted a decrease in the volume of the expander, and in clinic this was confirmed.  She presents today for TE exchange to permanent implant.    Description of the Procedure: Sheri [name] was identified in the preoperative holding area, where her history and physical exam were updated.  She was marked in the standing position at the sternal notch, midline, IMF, clavicle.  There was a small area of nodularity at the site of the old biopsy.  This was not visible well before with the full expansion, but is noticeable now.  The patient and her mother deny changes or enlargement.  She was then transported back to the operating room where she was positioned supine on the OR table.  A time out was called and the appropriate patient, procedure, and laterality were confirmed.  General endotracheal anesthesia was then induced via the anesthesia team.  Preoperative antibiotics were assured to be administered.  Bilateral arms were outstretched on arm boards, and secured with kerlix rolls to above the elbow.  All pressure points were assured to be padded.  She was test sat up, and assured to be centrally positioned on the table.  The chest was prepped and draped in the usual sterile fashion.    The previous incision was infiltrated with local anesthesia and a 15 blade scalpel was used to open the incision for part of the incision (sparing the medial 3 cm).  This was dissected in a stair step fashion superiorly for 1.5 cm.  The capsule was then entered.  The expander was removed.  There were small areas of non adherent alloderm, which was removed sharply.  The nodularity was palpated, the alloderm underlying the area was removed and a sample of the firm area was removed sharply with the scalpel.  There was a small contour anomaly at this location (small ~1cm divot after this excision).  The overlying skin appeared viable and remained normal in color and temperature throughout the remainder of the operation.  Superolaterally there was a tethering, and a capsulotomy was made in order to open the area for contour optimization.  The area was irrigated with 1L of betadine irrigant.  Sizers were then trialed.  A 770 mL SSF sizer was selected first, and fit the pocket well.  The patient was previously satisfied with the expander of 800 mL, so a SSX 800 mL was selected.  The patient was positioned upright, and the 800 mL SSX was a better match to the right native breast in volume (mastectomy specimen weight was 756 g).  The sizer was removed.  All gloves and instruments were exchanged for new.  The pocket was irrigated with 1L of antibiotic irrigant.  The pocket and skin edges were prepared with betadine, and the area toweled off with new surgical towels.  The implant matching the sizer was selected, and a puncture was made in the top of the container, with 100 mL of antibiotic irrigant instilled around the implant.  The implant was then inserted into the pocket.  The capsule was approximated with 3-0 vicryl suture in a simple buried fashion.  The deep dermis was approximated with 3-0 vicryl suture in a simple buried fashion.  The skin edges were approximated with 4-0 monocryl in a running subcuticular fashion.  The breast was cleansed and a dressing of steristrips, ABDs and a post mastectomy bra was then applied.  The patient tolerated the procedure well and was transported to the PACU in stable condition.  All instrument counts were correct at the conclusion of the case.

## 2018-09-26 NOTE — Unmapped (Signed)
confirmed 8/3 appt with patient

## 2018-09-26 NOTE — Unmapped (Signed)
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  Pathology appeared similar to previous tumor.      I called the patient to review the results.  I spoke with Dr Dellis Anes and she will call and schedule her for a follow up for excision/treatment.

## 2018-09-28 NOTE — Unmapped (Signed)
Patient Name: Sheri [name]  Patient Age: 23 y.o.  Encounter Date: 09/29/2018    Referring Physician:   Rosemarie Beath, PA  [address]  [place],  [place] [address]    Primary Care Provider:  Nicole Kindred, [place]    Breast Cancer Treatment Team:  Surgical Oncology: [name], DO   Surgical Oncology Nurse Practitioner: Lowry Bowl, NP and Pollyann Samples, NP  Radiation Oncology: Rubye Beach, MD  Medical Oncology: Marc Morgans, MD  Plastic Surgery: Lamarr Lulas, MD  Genetics: Lesia Hausen, [facility]    Reason for Visit: Breast cancer surveillance visit    HPI:  Sheri [name] is a 23 y.o. female who presents for followup after breast cancer surgery. Below is a synopsis of past and present health information pertinent to her breast cancer diagnosis, treatment and current health status. She is accompanied today by her father who also contributes to the history. Sheri [name] was last seen in clinic on 06/09/2018. Since that visit, she underwent reconstruction revision on 09/24/2018, at which time intraoperative pathology discovered remnant cancer at the site of her previous biopsy. Since that time, she denies associated symptoms including pain, redness, fever, chills, breast retraction, nipple inversion, breast swelling, nipple discharge, nausea, vomiting or weight loss. She presents today for definitive surgical planning.     Today she reports she noticed the mass had been changing since her tissue expanders were getting filled. She endorses tenderness since surgery and still today at the site. She is planning on going to the beach August 16, and would like surgery afterward. Patient began adjuvant capecitabine but only took 2 rounds. This is currently on hold given her recent implant exchange.  She is otherwise feeling well and is without specific complaints.     Historically, Sheri [name] presented with Left breast IDC (+/-/-) discovered on self exam. She completed neoadjuvant chemotherapy, and underwent left skin-sparing mastectomy and left sentinel lymph node biopsy with tissue expander placement, performed on 05/30/2018.     Diagnosis:    1. Left breast infiltrating ductal carcinoma  Cancer Staging  Malignant neoplasm of upper-outer quadrant of left breast in female, estrogen receptor negative (CMS-HCC)  Staging form: Breast, AJCC 8th Edition  - Clinical stage from 12/10/2017: Stage IIB (cT2, cN0(f), cM0, G3, ER+, PR-, HER2-) - Signed by Talbert Cage, DO on 12/10/2017  - Pathologic stage from 06/09/2018: No Stage Recommended (ypT1b, pN0(sn), cM0, G2, ER+, PR-, HER2-) - Signed by Rudie Meyer, ANP on 06/09/2018      Procedure:   1. Left skin-sparing mastectomy and left sentinel lymph node biopsy with tissue expander placement, performed on 05/30/2018.    Clinical Trial Participant:   1. No clinical trials at this time.    Medical Oncology: Adjuvant Chemotherapy with Marc Morgans, MD, held currently for surgery.    Genetics Evaluation: none    Review of Systems:  10 organ systems reviewed and pertinent as noted in HPI.       Medical history reviewed as below. I have also reviewed additional records as provided.  Oncology History Overview Note   Left breast cT2 N0 IDC, G3, weakly ER+, PR-, HER-     Malignant neoplasm of upper-outer quadrant of left breast in female, estrogen receptor negative (CMS-HCC)   12/03/2017 -  Presenting Symptoms    Presented with self detected left breast mass x 3 weeks.  MMG/US: Left breast irregular 2.7 x 3.2 cm mass. There are faint microcalcifications associated with the mass and mild surrounding  trabecular thickening is suggestive of edema.  There are 2 hyperdense and enlarged left axillary lymph nodes. No other suspicious findings are seen in the left breast.     12/03/2017 -  Other    Clinic exam: Left breast in the 1 to 2 o'clock position there is a 4 cm irregular mass. Subtle pink erythema without pitting. Left axilla with a palpable 2 cm lymph node.     12/03/2017 Biopsy    Left breast USG core biopsy: IDC, G3, ER+(5%), PR-(0), HER2-(0)  Left axillary USG core biopsy: fragments of LN negative for carcinoma     12/10/2017 Initial Diagnosis    Malignant neoplasm of upper-outer quadrant of left breast in female, estrogen receptor negative (CMS-HCC)     12/10/2017 Tumor Board    MDC recs: Left breast cT2 N0 IDC, G3, +/-/-. Very suspicious LN on imaging and exam. LN core biopsy negative. Discussed considering repeat core (although needle is definitively in the node) vs outback SLN. Will receive NACT. If node neg, plan for outback SLN. Node positivity would affect radiation recs if patient has mastectomy. Meeting genetics today. Will discuss fertility. Also discussed concerns for XRT in young patient in 20's - higher risk of complications from XRT long-term. Consider staging studies.     12/10/2017 -  Cancer Staged    Staging form: Breast, AJCC 8th Edition  - Clinical stage from 12/10/2017: Stage IIB (cT2, cN0(f), cM0, G3, ER+, PR-, HER2-) - Signed by Talbert Cage, DO on 12/10/2017       12/11/2017 Biopsy    Repeat left axillary core biopsy given clinical suspicion: LN negative for carcinoma     12/24/2017 - 02/17/2018 Chemotherapy    OP BREAST AC (DOSE DENSE)  DOXOrubicin 60 mg/m2, Cyclophosphamide 600 mg/m2 every 14 days     01/08/2018 Genetics    Patient has genetic testing done for breast cancer.  Negative [employer] Breast Cancer Guidelines-Based Panel.     02/25/2018 -  Chemotherapy    OP BREAST PACLItaxel WEEKLY / CARBOplatin EVERY 3 WEEKS  PACLItaxel 80 mg/m2 IV weekly   CARBOplatin AUC 6 IV every 3 weeks   21-day cycle x 4 cycles      05/30/2018 Surgery    Left SSM: Residual IDC, Gr 2 +/-/- measuring 8mm.  Final margins are clear.  0/4 SLN with metastatic disease  Residual Cancer Burden   Tumor bed size: 8 mm x 7 mm  Overall tumor cellularity: 40%  Percentage in situ: 0%  Number of involved lymph nodes: 0  Diameter of largest metastasis: 0 mm  Residual Cancer Burden Score: 1.687 Residual Cancer Burden Class: RCB-II     06/09/2018 -  Cancer Staged    Staging form: Breast, AJCC 8th Edition  - Pathologic stage from 06/09/2018: No Stage Recommended (ypT1b, pN0(sn), cM0, G2, ER+, PR-, HER2-) - Signed by Rudie Meyer, ANP on 06/09/2018         Past Medical History:   Diagnosis Date   ??? Anxiety    ??? Depression    ??? Malignant neoplasm of upper-outer quadrant of left breast in female, estrogen receptor negative (CMS-HCC) 12/10/2017   ??? Panic disorder    ??? PTSD (post-traumatic stress disorder)      Past Surgical History:   Procedure Laterality Date   ??? BREAST BIOPSY Left 2019    malignant   ??? CHEMOTHERAPY Left 11/2017    May 13 2018   ??? IR INSERT PORT AGE GREATER THAN 5 YRS  12/18/2017  IR INSERT PORT AGE GREATER THAN 5 YRS 12/18/2017 Sheri Fudge, MD IMG VIR [facility]   ??? PR BREAST Jackelyn Knife W TISS [facility] Left 05/30/2018    Procedure: BREAST RECON IMMED/DELAY W/EXPANDR [facility] EXPA;  Surgeon: Arsenio Katz, MD;  Location: ASC OR [facility];  Service: Plastics   ??? PR BX/REMV,LYMPH NODE,DEEP AXILL Left 05/30/2018    Procedure: BX/EXC LYMPH NODE; OPEN, DEEP AXILRY NODE;  Surgeon: Talbert Cage, DO;  Location[facility] ASC [facility];  Service: Surgical Oncology Breast   ??? PR IMPLNT BIO IMPLNT FOR SOFT TISSUE REINFORCEMENT Left 05/30/2018    Procedure: IMPLANTATION BIOLOGIC IMPLANT(EG, ACELLULAR DERMAL MATRIX) FOR SOFT TISSUE REINFORCEMENT(EG, BREAST, TRUNK);  Surgeon: Arsenio Katz, MD;  Location: ASC OR [facility];  Service: Plastics   ??? PR INSERT BREAST PROS IMMED AFTER EXCIS Left 05/30/2018    Procedure: IMMED INSRT BREAST PROSTH AFTER MASTOPEX/MASTECT;  Surgeon: Arsenio Katz, MD;  Location: ASC OR [facility];  Service: Plastics   ??? PR INTRAOPERATIVE SENTINEL LYMPH NODE ID W DYE INJECTION Left 05/30/2018    Procedure: INTRAOPERATIVE IDENTIFICATION SENTINEL LYMPH NODE(S) INCLUDE INJECTION NON-RADIOACTIVE DYE, WHEN PERFORMED;  Surgeon: Talbert Cage, DO;  Location: ASC OR [facility];  Service: Surgical Oncology Breast   ??? PR MASTECTOMY, SIMPLE, COMPLETE Left 05/30/2018    Procedure: Urgent MASTECTOMY, SIMPLE, COMPLETE;  Surgeon: Talbert Cage, DO;  Location: ASC OR [facility];  Service: Surgical Oncology Breast   ??? PR REPLACE TISSUE EXPANDER Left 09/24/2018    Procedure: REPLACEMENT OF TISSUE EXPANDER WITH PERMANENT PROSTHESIS;  Surgeon: Arsenio Katz, MD;  Location: MAIN OR [facility];  Service: Plastics   ??? PR REVISE BREAST RECONSTRUCTION Left 09/24/2018    Procedure: R21 - REVISION OF RECONSTRUCTED  BREAST;  Surgeon: Arsenio Katz, MD;  Location: MAIN [facility];  Service: Plastics   ??? PR SUSPENSION OF BREAST Right 09/24/2018    Procedure: MASTOPEXY FOR SYMMETRY PURPOSES;  Surgeon: Arsenio Katz, MD;  Location: MAIN OR [facility];  Service: Plastics     Family History   Problem Relation Age of Onset   ??? Skin cancer Mother    ??? Breast cancer Neg Hx    ??? Colon cancer Neg Hx    ??? Endometrial cancer Neg Hx    ??? Ovarian cancer Neg Hx      Social History     Socioeconomic History   ??? Marital status: Single     Spouse name: Not on file   ??? Number of children: Not on file   ??? Years of education: Not on file   ??? Highest education level: Not on file   Occupational History   ??? Not on file   Social Needs   ??? Financial resource strain: Not on file   ??? Food insecurity     Worry: Not on file     Inability: Not on file   ??? Transportation needs     Medical: Not on file     Non-medical: Not on file   Tobacco Use   ??? Smoking status: Never Smoker   ??? Smokeless tobacco: Never Used   Substance and Sexual Activity   ??? Alcohol use: Not Currently   ??? Drug use: Never   ??? Sexual activity: Not on file   Lifestyle   ??? Physical activity     Days per week: Not on file     Minutes per session: Not on file   ??? Stress: Not on file   Relationships   ???  Social Wellsite geologist on phone: Not on file     Gets together: Not on file     Attends religious service: Not on file     Active member of club or organization: Not on file     Attends meetings of clubs or organizations: Not on file     Relationship status: Not on file   Other Topics Concern   ??? Not on file   Social History Narrative    The patient is single.  She lives at home with her mother and brother       Current Outpatient Medications:   ???  acetaminophen (TYLENOL) 325 MG tablet, Take 325 mg by mouth every six (6) hours as needed for pain., Disp: , Rfl:   ???  ALPRAZolam (XANAX) 0.25 MG tablet, Take 0.25 mg by mouth nightly as needed for sleep., Disp: , Rfl:   ???  capecitabine (XELODA) 500 MG tablet, Take 3 tablets (1,500 mg total) by mouth Two (2) times a day . 14 days on then 7 days off , then repeat., Disp: 84 tablet, Rfl: 8  ???  ibuprofen (MOTRIN) 800 MG tablet, Take 1 tablet (800 mg total) by mouth every eight (8) hours as needed for pain., Disp: 70 tablet, Rfl: 2  ???  loperamide (IMODIUM) 2 mg capsule, Take 2 mg by mouth 4 (four) times a day as needed., Disp: , Rfl:   ???  LORazepam (ATIVAN) 0.5 MG tablet, Take one-half to one tablet every 6 hours as needed for nausea/vomiting, Disp: 30 tablet, Rfl: 0  ???  oxyCODONE (ROXICODONE) 5 MG immediate release tablet, Take 1 tablet (5 mg total) by mouth every four (4) hours as needed for pain for up to 5 days., Disp: 15 tablet, Rfl: 0  ???  pantoprazole (PROTONIX) 40 MG tablet, Take 1 tablet (40 mg total) by mouth daily., Disp: 30 tablet, Rfl: 3  ???  sertraline (ZOLOFT) 100 MG tablet, Take 1.5 tablets (150 mg total) by mouth daily., Disp: 45 tablet, Rfl: 1  Allergies   Allergen Reactions   ??? Amoxicillin Other (See Comments)     EDEMA OF FACE       Reproductive History:  Nulliparous. Menarche reported at age 53. Patient's last menstrual period was 09/03/2018.OCPs no. HRT no.      Physical Examination:   Blood pressure 128/65, pulse 81, temperature 36.6 ??C (97.9 ??F), temperature source Temporal, resp. rate 18, height 167.6 cm (5' 6), weight (!) 138.3 kg (305 lb), last menstrual period 09/03/2018, SpO2 96 %, not currently breastfeeding.  General Appearance:  No acute distress, well appearing and well nourished.   Head:  Normocephalic, atraumatic.   Eyes:  Conjuctiva and lids appear normal. Pupils equal and round,   sclera anicteric.   Ears:  Overall appearance normal with no scars, lesions or               masses.  Hearing is grossly normal.   Nose: Not evaluated today. Patient was wearing a mask during the visit due to COVID19 recommendations.   Throat: Not evaluated today. Patient was wearing a mask during the visit due to COVID19 recommendations..   Neck: Supple, symmetrical, trachea midline, no adenopathy;   thyroid: no enlargement/tenderness/nodules.   Pulmonary:    Normal respiratory effort. Lungs were clear to auscultation  bilaterally.   Cardiovascular:  Regular rate and rhythm, no murmur noted. No thrill noted.   Abdomen:   Soft, non-tender, without masses. No  hepatosplenomegaly. No hernias appreciated. Normoactive bowel sounds.   Musculoskeletal: Normal gait. Extremities without clubbing, cyanosis, or           edema.   Skin: Skin color, texture, turgor normal, no rashes or lesions.   Neurologic: No motor abnormalities noted. Sensation grossly intact.   Lymphatic:  Breast: No cervical or supraclavicular LAD noted.   A comprehensive examination of the breasts and chest wall was performed with the patient upright and supine with arms at her sides and above her head.   Left breast is surgically absent.  The native nipple is present.  The breast has been reconstructed with implant based reconstruction.  There is a nodule at 2 o'clock position 7 CFN measuring 1 x 1 cm just below and inferior to prior biopsy site.  Otherwise, no areas of concern.  No axillary,  infraclavicular or supraclavicular adenopathy.  Arm circumference is without evidence of lymphedema.   Right breast normal in size, normal symmetry, normal contour, no dimpling, no skin changes, nipple everted, nipple exhibits no crusting or excoriation, no masses or nodules, no palpable axillary adenopathy.   Psychiatric: Judgement and insight appropriate. Oriented to person,         place, and time.       Imaging Review:  No recent imaging    Biopsy/Pathology Review:  Surgical pathology was performed on 09/24/2018 at [facility] of the lesion of the left breast, which revealed infiltrating ductal carcinoma. Receptors not indicated.     Impression:  Sheri [name] is a 23 y.o. female with a history of left breast infiltrating ductal carcinoma, estrogen receptor positive, progesterone receptor negative and HER2 negative. See treatment summary above.   Staging  Cancer Staging  Malignant neoplasm of upper-outer quadrant of left breast in female, estrogen receptor negative (CMS-HCC)  Staging form: Breast, AJCC 8th Edition  - Clinical stage from 12/10/2017: Stage IIB (cT2, cN0(f), cM0, G3, ER+, PR-, HER2-) - Signed by Talbert Cage, DO on 12/10/2017  - Pathologic stage from 06/09/2018: No Stage Recommended (ypT1b, pN0(sn), cM0, G2, ER+, PR-, HER2-) - Signed by Rudie Meyer, ANP on 06/09/2018    After reviewing all available records and imaging, we had a frank and thorough discussion regarding her course of care. I believe Sheri [name] is a good candidate for excision of the biopsy site recurrence.  The risks and benefits of this treatment plan were discussed in detail.    We specifically discussed:  The pathology and a copy was provided to her. The type of tumor has been discussed.  We have requested receptors.   That she would benefit from additional imaging prior to final surgical planning. Patient is scheduled for a right breast MMG today and CT full body 10/02/2018.  I reviewed with the patient that I do not believe she has recurrent cancer or a new primary cancer, but a seeding remnant from her biopsy. I recommend further imaging and workup prior to definitive recommendation and planning.  That given her prior and recent negative axillary evaluation, I do not recommend additional axillary investigation at this time. Her axilla remains clinically node negative and was pathologically negative in 05/2018.  The importance of negative margins and that positive margins can necessitate a return to the operating room.  That she is a candidate for left chest wall excision. She will follow up with plastic surgery for continued reconstructive options.  The risks include but are not limited to bleeding, infection, inadequate surgical margins, unsightly scar, chronic pain, collateral damage  and imponderables to include, but not limited to disability or death.  That she may benefit from adjuvant systemic therapy. She will discuss systemic therapy further with medical oncology. Patient will continue with next chemotherapy treatment prior to surgery, since she will be travelling.   That she will be scheduled for left chest wall excision, tentatively on 10/23/2018 per her request of dates. Consent obtained and orders placed in EPIC.  She understands that she will require preoperative COVID 19 testing.     All of the patient's and family's questions and concerns were addressed during the visit and they are in agreement with the plan of care. Please do not hesitate to contact me with questions or concerns.    Cancer Staging  Malignant neoplasm of upper-outer quadrant of left breast in female, estrogen receptor negative (CMS-HCC)  Staging form: Breast, AJCC 8th Edition  - Clinical stage from 12/10/2017: Stage IIB (cT2, cN0(f), cM0, G3, ER+, PR-, HER2-) - Signed by Talbert Cage, DO on 12/10/2017  - Pathologic stage from 06/09/2018: No Stage Recommended (ypT1b, pN0(sn), cM0, G2, ER+, PR-, HER2-) - Signed by Rudie Meyer, ANP on 06/09/2018  Staging comments: Residual Cancer Burden   Tumor bed size: 8 mm x 7 mm  Overall tumor cellularity: 40%  Percentage in situ: 0%  Number of involved lymph nodes: 0  Diameter of largest metastasis: 0 mm  Residual Cancer Burden Score: 1.687  Residual Cancer Burden Class: RCB-II    Scribe's Attestation: [name], DO and Pollyann Samples, NP-C obtained and performed the history, physical exam and medical decision making elements that were entered into the chart.  Signed by Gretchen Short, Scribe, on September 29, 2018 12:22 PM    ----------------------------------------------------------------------------------------------------------------------  September 29, 2018 3:05 PM. Documentation assistance provided by the Scribe. I was present during the time the encounter was recorded. The information recorded by the Scribe was done at my direction and has been reviewed and validated by me.  ----------------------------------------------------------------------------------------------------------------------        Pollyann Samples, NP-C  Surgical Breast Oncology  09/29/2018  1:12 PM

## 2018-09-28 NOTE — Unmapped (Signed)
Patient Name: Sheri [name]  Patient Age: 23 y.o.  Encounter Date: 09/29/2018    Referring Physician:   Rosemarie Beath, PA  [address]  [place],  [place] [address]    Primary Care Provider:  Nicole Kindred, [place]    Breast Cancer Treatment Team:  Surgical Oncology: [name], DO   Surgical Oncology Nurse Practitioner: Lowry Bowl, NP and Pollyann Samples, NP  Radiation Oncology: Rubye Beach, MD  Medical Oncology: Marc Morgans, MD  Plastic Surgery: Lamarr Lulas, MD  Genetics: Lesia Hausen, [facility]    Reason for Visit: Breast cancer surveillance visit    HPI:  Sheri [name] is a 23 y.o. female who presents for followup after breast cancer surgery. Below is a synopsis of past and present health information pertinent to her breast cancer diagnosis, treatment and current health status. She is accompanied today by her father who also contributes to the history. Elaynah was last seen in clinic on 06/09/2018. Since that visit, she underwent reconstruction revision on 09/24/2018, at which time intraoperative pathology discovered remnant cancer at the site of her previous biopsy. Since that time, she denies associated symptoms including pain, redness, fever, chills, breast retraction, nipple inversion, breast swelling, nipple discharge, nausea, vomiting or weight loss. She presents today for definitive surgical planning.     Today she reports she noticed the mass had been changing since her tissue expanders were getting filled. She endorses tenderness since surgery and still today at the site. She is planning on going to the beach August 16, and would like surgery afterward. Patient began adjuvant capecitabine but only took 2 rounds. This is currently on hold given her recent implant exchange.  She is otherwise feeling well and is without specific complaints.     Historically, [name] presented with Left breast IDC (+/-/-) discovered on self exam. She completed neoadjuvant chemotherapy, and underwent left skin-sparing mastectomy and left sentinel lymph node biopsy with tissue expander placement, performed on 05/30/2018.     Diagnosis:    1. Left breast infiltrating ductal carcinoma  Cancer Staging  Malignant neoplasm of upper-outer quadrant of left breast in female, estrogen receptor negative (CMS-HCC)  Staging form: Breast, AJCC 8th Edition  - Clinical stage from 12/10/2017: Stage IIB (cT2, cN0(f), cM0, G3, ER+, PR-, HER2-) - Signed by Talbert Cage, DO on 12/10/2017  - Pathologic stage from 06/09/2018: No Stage Recommended (ypT1b, pN0(sn), cM0, G2, ER+, PR-, HER2-) - Signed by Rudie Meyer, ANP on 06/09/2018      Procedure:   1. Left skin-sparing mastectomy and left sentinel lymph node biopsy with tissue expander placement, performed on 05/30/2018.    Clinical Trial Participant:   1. No clinical trials at this time.    Medical Oncology: Adjuvant Chemotherapy with Marc Morgans, MD, held currently for surgery.    Genetics Evaluation: none    Review of Systems:  10 organ systems reviewed and pertinent as noted in HPI.       Medical history reviewed as below. I have also reviewed additional records as provided.  Oncology History Overview Note   Left breast cT2 N0 IDC, G3, weakly ER+, PR-, HER-     Malignant neoplasm of upper-outer quadrant of left breast in female, estrogen receptor negative (CMS-HCC)   12/03/2017 -  Presenting Symptoms    Presented with self detected left breast mass x 3 weeks.  MMG/US: Left breast irregular 2.7 x 3.2 cm mass. There are faint microcalcifications associated with the mass and mild surrounding  trabecular thickening is suggestive of edema.  There are 2 hyperdense and enlarged left axillary lymph nodes. No other suspicious findings are seen in the left breast.     12/03/2017 -  Other    Clinic exam: Left breast in the 1 to 2 o'clock position there is a 4 cm irregular mass. Subtle pink erythema without pitting. Left axilla with a palpable 2 cm lymph node.     12/03/2017 Biopsy    Left breast USG core biopsy: IDC, G3, ER+(5%), PR-(0), HER2-(0)  Left axillary USG core biopsy: fragments of LN negative for carcinoma     12/10/2017 Initial Diagnosis    Malignant neoplasm of upper-outer quadrant of left breast in female, estrogen receptor negative (CMS-HCC)     12/10/2017 Tumor Board    MDC recs: Left breast cT2 N0 IDC, G3, +/-/-. Very suspicious LN on imaging and exam. LN core biopsy negative. Discussed considering repeat core (although needle is definitively in the node) vs outback SLN. Will receive NACT. If node neg, plan for outback SLN. Node positivity would affect radiation recs if patient has mastectomy. Meeting genetics today. Will discuss fertility. Also discussed concerns for XRT in young patient in 20's - higher risk of complications from XRT long-term. Consider staging studies.     12/10/2017 -  Cancer Staged    Staging form: Breast, AJCC 8th Edition  - Clinical stage from 12/10/2017: Stage IIB (cT2, cN0(f), cM0, G3, ER+, PR-, HER2-) - Signed by Talbert Cage, DO on 12/10/2017       12/11/2017 Biopsy    Repeat left axillary core biopsy given clinical suspicion: LN negative for carcinoma     12/24/2017 - 02/17/2018 Chemotherapy    OP BREAST AC (DOSE DENSE)  DOXOrubicin 60 mg/m2, Cyclophosphamide 600 mg/m2 every 14 days     01/08/2018 Genetics    Patient has genetic testing done for breast cancer.  Negative [employer] Breast Cancer Guidelines-Based Panel.     02/25/2018 -  Chemotherapy    OP BREAST PACLItaxel WEEKLY / CARBOplatin EVERY 3 WEEKS  PACLItaxel 80 mg/m2 IV weekly   CARBOplatin AUC 6 IV every 3 weeks   21-day cycle x 4 cycles      05/30/2018 Surgery    Left SSM: Residual IDC, Gr 2 +/-/- measuring 8mm.  Final margins are clear.  0/4 SLN with metastatic disease  Residual Cancer Burden   Tumor bed size: 8 mm x 7 mm  Overall tumor cellularity: 40%  Percentage in situ: 0%  Number of involved lymph nodes: 0  Diameter of largest metastasis: 0 mm  Residual Cancer Burden Score: 1.687 Residual Cancer Burden Class: RCB-II     06/09/2018 -  Cancer Staged    Staging form: Breast, AJCC 8th Edition  - Pathologic stage from 06/09/2018: No Stage Recommended (ypT1b, pN0(sn), cM0, G2, ER+, PR-, HER2-) - Signed by Rudie Meyer, ANP on 06/09/2018         Past Medical History:   Diagnosis Date   ??? Anxiety    ??? Depression    ??? Malignant neoplasm of upper-outer quadrant of left breast in female, estrogen receptor negative (CMS-HCC) 12/10/2017   ??? Panic disorder    ??? PTSD (post-traumatic stress disorder)      Past Surgical History:   Procedure Laterality Date   ??? BREAST BIOPSY Left 2019    malignant   ??? CHEMOTHERAPY Left 11/2017    May 13 2018   ??? IR INSERT PORT AGE GREATER THAN 5 YRS  12/18/2017  IR INSERT PORT AGE GREATER THAN 5 YRS 12/18/2017 [name], MD IMG VIR [facility]   ??? PR BREAST Jackelyn Knife W TISS [facility] Left 05/30/2018    Procedure: BREAST RECON IMMED/DELAY W/EXPANDR [facility] EXPA;  Surgeon: Arsenio Katz, MD;  Location: ASC OR [facility];  Service: Plastics   ??? PR BX/REMV,LYMPH NODE,DEEP AXILL Left 05/30/2018    Procedure: BX/EXC LYMPH NODE; OPEN, DEEP AXILRY NODE;  Surgeon: Talbert Cage, DO;  Location: ASC [facility];  Service: Surgical Oncology Breast   ??? PR IMPLNT BIO IMPLNT FOR SOFT TISSUE REINFORCEMENT Left 05/30/2018    Procedure: IMPLANTATION BIOLOGIC IMPLANT(EG, ACELLULAR DERMAL MATRIX) FOR SOFT TISSUE REINFORCEMENT(EG, BREAST, TRUNK);  Surgeon: Arsenio Katz, MD;  Location: ASC OR [facility];  Service: Plastics   ??? PR INSERT BREAST PROS IMMED AFTER EXCIS Left 05/30/2018    Procedure: IMMED INSRT BREAST PROSTH AFTER MASTOPEX/MASTECT;  Surgeon: Arsenio Katz, MD;  Location: ASC OR [facility];  Service: Plastics   ??? PR INTRAOPERATIVE SENTINEL LYMPH NODE ID W DYE INJECTION Left 05/30/2018    Procedure: INTRAOPERATIVE IDENTIFICATION SENTINEL LYMPH NODE(S) INCLUDE INJECTION NON-RADIOACTIVE DYE, WHEN PERFORMED;  Surgeon: Talbert Cage, DO;  Location: ASC OR [facility];  Service: Surgical Oncology Breast   ??? PR MASTECTOMY, SIMPLE, COMPLETE Left 05/30/2018    Procedure: Urgent MASTECTOMY, SIMPLE, COMPLETE;  Surgeon: Talbert Cage, DO;  Location: ASC OR [facility];  Service: Surgical Oncology Breast   ??? PR REPLACE TISSUE EXPANDER Left 09/24/2018    Procedure: REPLACEMENT OF TISSUE EXPANDER WITH PERMANENT PROSTHESIS;  Surgeon: Arsenio Katz, MD;  Location: MAIN OR [facility];  Service: Plastics   ??? PR REVISE BREAST RECONSTRUCTION Left 09/24/2018    Procedure: R21 - REVISION OF RECONSTRUCTED  BREAST;  Surgeon: Arsenio Katz, MD;  Location: MAIN [facility];  Service: Plastics   ??? PR SUSPENSION OF BREAST Right 09/24/2018    Procedure: MASTOPEXY FOR SYMMETRY PURPOSES;  Surgeon: Arsenio Katz, MD;  Location: MAIN OR [facility];  Service: Plastics     Family History   Problem Relation Age of Onset   ??? Skin cancer Mother    ??? Breast cancer Neg Hx    ??? Colon cancer Neg Hx    ??? Endometrial cancer Neg Hx    ??? Ovarian cancer Neg Hx      Social History     Socioeconomic History   ??? Marital status: Single     Spouse name: Not on file   ??? Number of children: Not on file   ??? Years of education: Not on file   ??? Highest education level: Not on file   Occupational History   ??? Not on file   Social Needs   ??? Financial resource strain: Not on file   ??? Food insecurity     Worry: Not on file     Inability: Not on file   ??? Transportation needs     Medical: Not on file     Non-medical: Not on file   Tobacco Use   ??? Smoking status: Never Smoker   ??? Smokeless tobacco: Never Used   Substance and Sexual Activity   ??? Alcohol use: Not Currently   ??? Drug use: Never   ??? Sexual activity: Not on file   Lifestyle   ??? Physical activity     Days per week: Not on file     Minutes per session: Not on file   ??? Stress: Not on file   Relationships   ???  Social Wellsite geologist on phone: Not on file     Gets together: Not on file     Attends religious service: Not on file     Active member of club or organization: Not on file     Attends meetings of clubs or organizations: Not on file     Relationship status: Not on file   Other Topics Concern   ??? Not on file   Social History Narrative    The patient is single.  She lives at home with her mother and brother       Current Outpatient Medications:   ???  acetaminophen (TYLENOL) 325 MG tablet, Take 325 mg by mouth every six (6) hours as needed for pain., Disp: , Rfl:   ???  ALPRAZolam (XANAX) 0.25 MG tablet, Take 0.25 mg by mouth nightly as needed for sleep., Disp: , Rfl:   ???  capecitabine (XELODA) 500 MG tablet, Take 3 tablets (1,500 mg total) by mouth Two (2) times a day . 14 days on then 7 days off , then repeat., Disp: 84 tablet, Rfl: 8  ???  ibuprofen (MOTRIN) 800 MG tablet, Take 1 tablet (800 mg total) by mouth every eight (8) hours as needed for pain., Disp: 70 tablet, Rfl: 2  ???  loperamide (IMODIUM) 2 mg capsule, Take 2 mg by mouth 4 (four) times a day as needed., Disp: , Rfl:   ???  LORazepam (ATIVAN) 0.5 MG tablet, Take one-half to one tablet every 6 hours as needed for nausea/vomiting, Disp: 30 tablet, Rfl: 0  ???  oxyCODONE (ROXICODONE) 5 MG immediate release tablet, Take 1 tablet (5 mg total) by mouth every four (4) hours as needed for pain for up to 5 days., Disp: 15 tablet, Rfl: 0  ???  pantoprazole (PROTONIX) 40 MG tablet, Take 1 tablet (40 mg total) by mouth daily., Disp: 30 tablet, Rfl: 3  ???  sertraline (ZOLOFT) 100 MG tablet, Take 1.5 tablets (150 mg total) by mouth daily., Disp: 45 tablet, Rfl: 1  Allergies   Allergen Reactions   ??? Amoxicillin Other (See Comments)     EDEMA OF FACE       Reproductive History:  Nulliparous. Menarche reported at age 53. Patient's last menstrual period was 09/03/2018.OCPs no. HRT no.      Physical Examination:   Blood pressure 128/65, pulse 81, temperature 36.6 ??C (97.9 ??F), temperature source Temporal, resp. rate 18, height 167.6 cm (5' 6), weight (!) 138.3 kg (305 lb), last menstrual period 09/03/2018, SpO2 96 %, not currently breastfeeding.  General Appearance:  No acute distress, well appearing and well nourished.   Head:  Normocephalic, atraumatic.   Eyes:  Conjuctiva and lids appear normal. Pupils equal and round,   sclera anicteric.   Ears:  Overall appearance normal with no scars, lesions or               masses.  Hearing is grossly normal.   Nose: Not evaluated today. Patient was wearing a mask during the visit due to COVID19 recommendations.   Throat: Not evaluated today. Patient was wearing a mask during the visit due to COVID19 recommendations..   Neck: Supple, symmetrical, trachea midline, no adenopathy;   thyroid: no enlargement/tenderness/nodules.   Pulmonary:    Normal respiratory effort. Lungs were clear to auscultation  bilaterally.   Cardiovascular:  Regular rate and rhythm, no murmur noted. No thrill noted.   Abdomen:   Soft, non-tender, without masses. No  hepatosplenomegaly. No hernias appreciated. Normoactive bowel sounds.   Musculoskeletal: Normal gait. Extremities without clubbing, cyanosis, or           edema.   Skin: Skin color, texture, turgor normal, no rashes or lesions.   Neurologic: No motor abnormalities noted. Sensation grossly intact.   Lymphatic:  Breast: No cervical or supraclavicular LAD noted.   A comprehensive examination of the breasts and chest wall was performed with the patient upright and supine with arms at her sides and above her head.   Left breast is surgically absent.  The native nipple is present.  The breast has been reconstructed with implant based reconstruction.  There is a nodule at 2 o'clock position 7 CFN measuring 1 x 1 cm just below and inferior to prior biopsy site.  Otherwise, no areas of concern.  No axillary,  infraclavicular or supraclavicular adenopathy.  Arm circumference is without evidence of lymphedema.   Right breast normal in size, normal symmetry, normal contour, no dimpling, no skin changes, nipple everted, nipple exhibits no crusting or excoriation, no masses or nodules, no palpable axillary adenopathy.   Psychiatric: Judgement and insight appropriate. Oriented to person,         place, and time.       Imaging Review:  No recent imaging    Biopsy/Pathology Review:  Surgical pathology was performed on 09/24/2018 at [facility] of the lesion of the left breast, which revealed infiltrating ductal carcinoma. Receptors not indicated.     Impression:  Sheri [name] is a 23 y.o. female with a history of left breast infiltrating ductal carcinoma, estrogen receptor positive, progesterone receptor negative and HER2 negative. See treatment summary above.   Staging  Cancer Staging  Malignant neoplasm of upper-outer quadrant of left breast in female, estrogen receptor negative (CMS-HCC)  Staging form: Breast, AJCC 8th Edition  - Clinical stage from 12/10/2017: Stage IIB (cT2, cN0(f), cM0, G3, ER+, PR-, HER2-) - Signed by Talbert Cage, DO on 12/10/2017  - Pathologic stage from 06/09/2018: No Stage Recommended (ypT1b, pN0(sn), cM0, G2, ER+, PR-, HER2-) - Signed by Rudie Meyer, ANP on 06/09/2018    After reviewing all available records and imaging, we had a frank and thorough discussion regarding her course of care. I believe Katria is a good candidate for excision of the biopsy site recurrence.  The risks and benefits of this treatment plan were discussed in detail.    We specifically discussed:  The pathology and a copy was provided to her. The type of tumor has been discussed.  We have requested receptors.   That she would benefit from additional imaging prior to final surgical planning. Patient is scheduled for a right breast MMG today and CT full body 10/02/2018.  I reviewed with the patient that I do not believe she has recurrent cancer or a new primary cancer, but a seeding remnant from her biopsy. I recommend further imaging and workup prior to definitive recommendation and planning.  That given her prior and recent negative axillary evaluation, I do not recommend additional axillary investigation at this time. Her axilla remains clinically node negative and was pathologically negative in 05/2018.  The importance of negative margins and that positive margins can necessitate a return to the operating room.  That she is a candidate for left chest wall excision. She will follow up with plastic surgery for continued reconstructive options.  The risks include but are not limited to bleeding, infection, inadequate surgical margins, unsightly scar, chronic pain, collateral damage  and imponderables to include, but not limited to disability or death.  That she may benefit from adjuvant systemic therapy. She will discuss systemic therapy further with medical oncology. Patient will continue with next chemotherapy treatment prior to surgery, since she will be travelling.   That she will be scheduled for left chest wall excision, tentatively on 10/23/2018 per her request of dates. Consent obtained and orders placed in EPIC.  She understands that she will require preoperative COVID 19 testing.     All of the patient's and family's questions and concerns were addressed during the visit and they are in agreement with the plan of care. Please do not hesitate to contact me with questions or concerns.    Cancer Staging  Malignant neoplasm of upper-outer quadrant of left breast in female, estrogen receptor negative (CMS-HCC)  Staging form: Breast, AJCC 8th Edition  - Clinical stage from 12/10/2017: Stage IIB (cT2, cN0(f), cM0, G3, ER+, PR-, HER2-) - Signed by Talbert Cage, DO on 12/10/2017  - Pathologic stage from 06/09/2018: No Stage Recommended (ypT1b, pN0(sn), cM0, G2, ER+, PR-, HER2-) - Signed by Rudie Meyer, ANP on 06/09/2018  Staging comments: Residual Cancer Burden   Tumor bed size: 8 mm x 7 mm  Overall tumor cellularity: 40%  Percentage in situ: 0%  Number of involved lymph nodes: 0  Diameter of largest metastasis: 0 mm  Residual Cancer Burden Score: 1.687  Residual Cancer Burden Class: RCB-II    Scribe's Attestation: [name], DO and Pollyann Samples, NP-C obtained and performed the history, physical exam and medical decision making elements that were entered into the chart.  Signed by Gretchen Short, Scribe, on September 29, 2018 12:22 PM    ----------------------------------------------------------------------------------------------------------------------  September 29, 2018 3:05 PM. Documentation assistance provided by the Scribe. I was present during the time the encounter was recorded. The information recorded by the Scribe was done at my direction and has been reviewed and validated by me.  ----------------------------------------------------------------------------------------------------------------------        Pollyann Samples, NP-C  Surgical Breast Oncology  09/29/2018  1:12 PM

## 2018-09-29 ENCOUNTER — Encounter: Admit: 2018-09-29 | Discharge: 2018-09-30 | Payer: MEDICAID

## 2018-09-29 ENCOUNTER — Encounter: Admit: 2018-09-29 | Discharge: 2018-09-29 | Payer: MEDICAID

## 2018-09-29 DIAGNOSIS — Z171 Estrogen receptor negative status [ER-]: Secondary | ICD-10-CM

## 2018-09-29 DIAGNOSIS — C50412 Malignant neoplasm of upper-outer quadrant of left female breast: Principal | ICD-10-CM

## 2018-09-29 MED ORDER — SERTRALINE 100 MG TABLET
ORAL_TABLET | Freq: Every day | ORAL | 1 refills | 30.00000 days | Status: CP
Start: 2018-09-29 — End: 2019-09-29
  Filled 2018-10-09: qty 45, 30d supply, fill #0

## 2018-09-29 NOTE — Unmapped (Signed)
It was a pleasure to see you today in the Breast Surgical Oncology Clinic. Please call my nurse navigator, Lindwood Qua, if you have any interval questions or concerns.     You are scheduled for a left chest wall excision tentatively on Thursday, 10/23/2018.  This date is always subject to change, so make sure we have a good working phone number to contact you in case this date changes. The OR staff will contact you by 4pm the day before your surgery date to let you know your expected surgery time. Be sure to bring a book or something to keep you occupied during the day as the surgery time may be earlier or later than the expected time. The pre-care clinic number is [phone] if you have any questions.    Here is what you can expect following a breast surgery:  ? You may experience some bruising and pain. You will be given prescription pain medication after the procedure to use as needed for moderate to severe pain. This medication usually is a combination of a narcotic and tylenol (acetaminophen). This is important because you should not take additional tylenol while taking the prescription pain medication.    ? If you experience mild pain, you may take Tylenol (acetaminophen) or ibuprofen for pain relief, but take as recommended on the label. Remember that your prescription narcotic has tylenol in it, so make sure that you do not take the prescription medication in addition to tylenol.    ? You may shower as normal. No tub baths as your incision will be covered with skin glue. Do not place any lotions, ointments or creams as this can dissolve the glue. The glue will flake off on its own. Do not actively peel it from the skin.    ? You should be able to resume your normal daily activities.  However, you may want to refrain from activities such as aerobics, running, boat riding, or repetitive arm motions such as cooking, cleaning, gardening, etc. for 7-10 days to minimize the amount of pain you may experience.  We recommend that you wear a snug fitting bra for 24-hours-a-day at least for the first 2-3 days, to reduce swelling and bruising and for your added comfort.    ? If any bleeding from the site resumes, hold pressure for 15 minutes.  If this does not help or if other problems occur, please call.        If you have any questions or need anything from the surgical oncology department, here are some additional numbers.    Surgical oncology contact information:  For appointments, please call [phone].     Nurse Navigator available Monday through Friday 8:30 am to 4:30 pm:   Lindwood Qua, RN:    Phone: [phone]   Fax: [phone] attention Vivia Budge    For emergencies, evenings or weekends, please call [phone] and ask for the surgical oncology resident on call. Please be aware that this person is also responding to in-hospital emergencies and patient issues and may not answer your phone call immediately, but will return your call as soon as possible.

## 2018-09-30 MED ORDER — LORAZEPAM 0.5 MG TABLET
ORAL_TABLET | Freq: Three times a day (TID) | ORAL | 0 refills | 10 days | Status: CP | PRN
Start: 2018-09-30 — End: 2019-09-30

## 2018-10-02 ENCOUNTER — Ambulatory Visit: Admit: 2018-10-02 | Discharge: 2018-10-03 | Payer: MEDICAID

## 2018-10-02 ENCOUNTER — Encounter: Admit: 2018-10-02 | Discharge: 2018-10-03 | Payer: MEDICAID

## 2018-10-02 DIAGNOSIS — Z9882 Breast implant status: Principal | ICD-10-CM

## 2018-10-02 DIAGNOSIS — C50912 Malignant neoplasm of unspecified site of left female breast: Principal | ICD-10-CM

## 2018-10-02 DIAGNOSIS — Z171 Estrogen receptor negative status [ER-]: Secondary | ICD-10-CM

## 2018-10-02 NOTE — Unmapped (Signed)
Adolescent and Young Adult Cancer Program Visit   Comprehensive Cancer Support Program     MOST AYA VISITS ARE BEING CONDUCTED REMOTELY VIA PHONE OR VIDEO.  THE AYA TEAM IS OFFSITE AND REMAINS AVAILABLE VIRTUALLY Monday - Thursday from 8am - 5pm. Available in-person on Fridays 8am - 5pm.    Service date: October 01, 2018    Encounter type: Virtual - telephone call and text message exchange  ??  Clinician: [name], LCSWA  ??  Patient identifiers:??Sheri [name]??[name]??is a 23 y.o.??with newly diagnosed breast cancer and a history of panic disorder and PTSD.??[name]??uses she/her/hers??pronouns.??    VISIT SUMMARY     Notes: Called [name] to provide support and assess for barriers to psychiatric care given recent missed appointments with [name].      Brief Summary of Issues:   ?? Psychiatric care - Barriers include co-pays, lack of a safe and private space for video visits, and difficulty keeping appointments due to  depression and disassociation. She would prefer in-person care due to unsafe home environment, but also understands that all [facility] appointments are virtual right now and that her co-pay would only be $10 per [name]. She would like to resume care with [name], and has good insight into her need for consistent psychiatric care. She feels her anti-depressant is helpful, and hopes for a short-acting anti-anxiety medication like Ativan for panic attacks. Notes that she understands Xanax is a less preferable option, and states that hydroxyzine prescribed by PCP puts her to sleep, which feels unsafe given home environment and need to be away from home as much as possible. Provided contact information for [facility] scheduler. Discussed case with [name], who informed me that [name] planned to write a prescription for Ativan. Informed patient and encouraged her to reach out to [name] with any questions.  ?? Counseling - She is still interested in establishing care with a counselor, but continues to struggle with this due to co-pays. Encouraged her to request make-up appointment with behavioral health specialist at [facility], who may be able to connect her to a local therapist she can see in person for privacy and safety reasons. Also offered to provide interim counseling, though she understands I am unable to provide full, structured therapy for PTSD and panic. She plans to let me know if this would be helpful.    Plan: Will be available for continued support.    I will continue to follow Marcellene for AYA-appropriate support. [name] has my contact information and has been encouraged to contact me as needed.     10/01/2018     [name], LCSWA  Adolescent and Young Adult Clinical Social Worker  Pager: [phone]  Available by page to conduct virtual care due to COVID-19

## 2018-10-06 NOTE — Unmapped (Signed)
CC:     HPI:  Sheri [name] is a 23 y.o. female who is here today as a post op check after TE to implant on 7/29.  Biopsy of area on the lateral aspect of the flap, at previous biopsy site, was notable for invasive cancer.  Since that time she has follow up with Dr Dellis Anes and excision of margins is planned.  She is pleased this area was sampled, and although is nervous moving forward, happy that the excision is limited.      She is pleased with the volume on the left breast.  She may want a symmetry procedure on the right in the future.    Past Medical History:  Past Medical History:   Diagnosis Date   ??? Anxiety    ??? Depression    ??? Malignant neoplasm of upper-outer quadrant of left breast in female, estrogen receptor negative (CMS-HCC) 12/10/2017   ??? Panic disorder    ??? PTSD (post-traumatic stress disorder)        Past Surgical History:  Past Surgical History:   Procedure Laterality Date   ??? AUGMENTATION MAMMAPLASTY Left 08/2018    left only    ??? BREAST BIOPSY Left 2019    malignant   ??? CHEMOTHERAPY Left 11/2017    May 13 2018   ??? IR INSERT PORT AGE GREATER THAN 5 YRS  12/18/2017    IR INSERT PORT AGE GREATER THAN 5 YRS 12/18/2017 [name], MD IMG [facility]IR [facility]   ??? MASTECTOMY Left 05/2018   ??? PR BREAST RECONSTRUC W TISS EXPANDR Left 05/30/2018    Procedure: BREAST RECON IMMED/DELAY W/EXPANDR W/SUBSQT EXPA;  Surgeon: Arsenio Katz, MD;  Location: ASC OR [facility];  Service: Plastics   ??? PR BX/REMV,LYMPH NODE,DEEP AXILL Left 05/30/2018    Procedure: BX/EXC LYMPH NODE; OPEN, DEEP AXILRY NODE;  Surgeon: Talbert Cage, DO;  Location: [facility];  Service: Surgical Oncology Breast   ??? PR IMPLNT BIO IMPLNT FOR SOFT TISSUE REINFORCEMENT Left 05/30/2018    Procedure: IMPLANTATION BIOLOGIC IMPLANT(EG, ACELLULAR DERMAL MATRIX) FOR SOFT TISSUE REINFORCEMENT(EG, BREAST, TRUNK);  Surgeon: Arsenio Katz, MD;  Location: ASC OR [facility];  Service: Plastics   ??? PR INSERT BREAST PROS IMMED AFTER EXCIS Left 05/30/2018    Procedure: IMMED INSRT BREAST PROSTH AFTER MASTOPEX/MASTECT;  Surgeon: Arsenio Katz, MD;  Location: [facility] OR [facility];  Service: Plastics   ??? PR INTRAOPERATIVE SENTINEL LYMPH NODE ID W DYE INJECTION Left 05/30/2018    Procedure: INTRAOPERATIVE IDENTIFICATION SENTINEL LYMPH NODE(S) INCLUDE INJECTION NON-RADIOACTIVE DYE, WHEN PERFORMED;  Surgeon: Talbert Cage, DO;  Location: [facility] OR [facility];  Service: Surgical Oncology Breast   ??? PR MASTECTOMY, SIMPLE, COMPLETE Left 05/30/2018    Procedure: Urgent MASTECTOMY, SIMPLE, COMPLETE;  Surgeon: Talbert Cage, DO;  Location: [facility] OR [facility];  Service: Surgical Oncology Breast   ??? PR REPLACE TISSUE EXPANDER Left 09/24/2018    Procedure: REPLACEMENT OF TISSUE EXPANDER WITH PERMANENT PROSTHESIS;  Surgeon: Arsenio Katz, MD;  Location: MAIN OR [facility];  Service: Plastics   ??? PR REVISE BREAST RECONSTRUCTION Left 09/24/2018    Procedure: R21 - REVISION OF RECONSTRUCTED  BREAST;  Surgeon: Arsenio Katz, MD;  Location: MAIN OR [facility];  Service: Plastics   ??? PR SUSPENSION OF BREAST Right 09/24/2018    Procedure: MASTOPEXY FOR SYMMETRY PURPOSES;  Surgeon: Arsenio Katz, MD;  Location: MAIN OR [facility];  Service: Plastics       Medications:  Current Outpatient Medications  Medication Sig Dispense Refill   ??? acetaminophen (TYLENOL) 325 MG tablet Take 325 mg by mouth every six (6) hours as needed for pain.     ??? ALPRAZolam (XANAX) 0.25 MG tablet Take 0.25 mg by mouth nightly as needed for sleep.     ??? capecitabine (XELODA) 500 MG tablet Take 3 tablets (1,500 mg total) by mouth Two (2) times a day . 14 days on then 7 days off , then repeat. 84 tablet 8   ??? ibuprofen (MOTRIN) 800 MG tablet Take 1 tablet (800 mg total) by mouth every eight (8) hours as needed for pain. 70 tablet 2   ??? loperamide (IMODIUM) 2 mg capsule Take 2 mg by mouth 4 (four) times a day as needed.     ??? LORazepam (ATIVAN) 0.5 MG tablet Take one-half to one tablet every 6 hours as needed for nausea/vomiting 30 tablet 0   ??? LORazepam (ATIVAN) 0.5 MG tablet Take 1 tablet (0.5 mg total) by mouth every eight (8) hours as needed for anxiety. 30 tablet 0   ??? pantoprazole (PROTONIX) 40 MG tablet Take 1 tablet (40 mg total) by mouth daily. 30 tablet 3   ??? sertraline (ZOLOFT) 100 MG tablet Take 1 and 1/2 tablets (150 mg total) by mouth daily. 45 tablet 1     No current facility-administered medications for this visit.        Allergies:  Allergies   Allergen Reactions   ??? Amoxicillin Other (See Comments)     EDEMA OF FACE       Family History:   Family History   Problem Relation Age of Onset   ??? Skin cancer Mother    ??? Breast cancer Neg Hx    ??? Colon cancer Neg Hx    ??? Endometrial cancer Neg Hx    ??? Ovarian cancer Neg Hx     The patient reports no family history for bleeding disorders or anesthetic problems.    Social History:  Social History     Socioeconomic History   ??? Marital status: Single     Spouse name: Not on file   ??? Number of children: Not on file   ??? Years of education: Not on file   ??? Highest education level: Not on file   Occupational History   ??? Not on file   Social Needs   ??? Financial resource strain: Not on file   ??? Food insecurity     Worry: Not on file     Inability: Not on file   ??? Transportation needs     Medical: Not on file     Non-medical: Not on file   Tobacco Use   ??? Smoking status: Never Smoker   ??? Smokeless tobacco: Never Used   Substance and Sexual Activity   ??? Alcohol use: Not Currently   ??? Drug use: Never   ??? Sexual activity: Not on file   Lifestyle   ??? Physical activity     Days per week: Not on file     Minutes per session: Not on file   ??? Stress: Not on file   Relationships   ??? Social Wellsite geologist on phone: Not on file     Gets together: Not on file     Attends religious service: Not on file     Active member of club or organization: Not on file     Attends meetings of clubs or organizations: Not on file  Relationship status: Not on file   Other Topics Concern   ??? Not on file   Social History Narrative    The patient is single.  She lives at home with her mother and brother       ROS:   Otherwise, 12 point review of systems was completed and is negative except as per HPI.      Vitals:   Vitals:    10/02/18 1258   BP: 131/77   Pulse: 86   Resp: 18   Temp: 36.3 ??C (97.4 ??F)   SpO2: 95%     ?  Physical Exam:   Gen: AA in NAD  HEENT: NCAT, EOMI, PERRL, Non icteric sclerae, MMM  CV: RRR  RESP: quiet respirations on room air  EXT: warm and well perfused  Breast: right native breast with grade 2-3 ptosis.  Left breast with skin now healing after implant placement, incision well approximated.  No surrounding erythema.  Ptosis on the left as well.  Volume on right larger than left, but similar in a bra.  ?  Impression and Plan:  Alea Ryer is here today as a post op check after TE to implant placement one week ago.  Incidentally biopsied area with IDC.  -Will discuss with Dr Dellis Anes as the alloderm has been removed from this area to biopsy; implant will likely be visible at the time of full thickness resection.  Would need irrigation and 3days of antibiotics post op; would like to be present for the closure of this due to the implant presence  -Would in the future discuss small mastopexy on the right (small volume reduction), and a crescenteric mastopexy on the left.          No follow-ups on file.

## 2018-10-06 NOTE — Unmapped (Signed)
CC:     HPI:  Sheri [name] is a 23 y.o. female who is here today as a post op check after TE to implant on 7/29.  Biopsy of area on the lateral aspect of the flap, at previous biopsy site, was notable for invasive cancer.  Since that time she has follow up with Dr Dellis Anes and excision of margins is planned.  She is pleased this area was sampled, and although is nervous moving forward, happy that the excision is limited.      She is pleased with the volume on the left breast.  She may want a symmetry procedure on the right in the future.    Past Medical History:  Past Medical History:   Diagnosis Date   ??? Anxiety    ??? Depression    ??? Malignant neoplasm of upper-outer quadrant of left breast in female, estrogen receptor negative (CMS-HCC) 12/10/2017   ??? Panic disorder    ??? PTSD (post-traumatic stress disorder)        Past Surgical History:  Past Surgical History:   Procedure Laterality Date   ??? AUGMENTATION MAMMAPLASTY Left 08/2018    left only    ??? BREAST BIOPSY Left 2019    malignant   ??? CHEMOTHERAPY Left 11/2017    May 13 2018   ??? IR INSERT PORT AGE GREATER THAN 5 YRS  12/18/2017    IR INSERT PORT AGE GREATER THAN 5 YRS 12/18/2017 [name], MD IMG VIR [facility]   ??? MASTECTOMY Left 05/2018   ??? PR BREAST RECONSTRUC W TISS EXPANDR Left 05/30/2018    Procedure: BREAST RECON IMMED/DELAY W/EXPANDR W/SUBSQT EXPA;  Surgeon: Arsenio Katz, MD;  Location: [facility] OR [facility];  Service: Plastics   ??? PR BX/REMV,LYMPH NODE,DEEP AXILL Left 05/30/2018    Procedure: BX/EXC LYMPH NODE; OPEN, DEEP AXILRY NODE;  Surgeon: Talbert Cage, DO;  Location: [facility];  Service: Surgical Oncology Breast   ??? PR IMPLNT BIO IMPLNT FOR SOFT TISSUE REINFORCEMENT Left 05/30/2018    Procedure: IMPLANTATION BIOLOGIC IMPLANT(EG, ACELLULAR DERMAL MATRIX) FOR SOFT TISSUE REINFORCEMENT(EG, BREAST, TRUNK);  Surgeon: Arsenio Katz, MD;  Location: [facility] OR [facility];  Service: Plastics   ??? PR INSERT BREAST PROS IMMED AFTER EXCIS Left 05/30/2018    Procedure: IMMED INSRT BREAST PROSTH AFTER MASTOPEX/MASTECT;  Surgeon: Arsenio Katz, MD;  Location: [facility] OR [facility];  Service: Plastics   ??? PR INTRAOPERATIVE SENTINEL LYMPH NODE ID W DYE INJECTION Left 05/30/2018    Procedure: INTRAOPERATIVE IDENTIFICATION SENTINEL LYMPH NODE(S) INCLUDE INJECTION NON-RADIOACTIVE DYE, WHEN PERFORMED;  Surgeon: Talbert Cage, DO;  Location: [facility] OR [facility];  Service: Surgical Oncology Breast   ??? PR MASTECTOMY, SIMPLE, COMPLETE Left 05/30/2018    Procedure: Urgent MASTECTOMY, SIMPLE, COMPLETE;  Surgeon: Talbert Cage, DO;  Location: [facility] OR [facility];  Service: Surgical Oncology Breast   ??? PR REPLACE TISSUE EXPANDER Left 09/24/2018    Procedure: REPLACEMENT OF TISSUE EXPANDER WITH PERMANENT PROSTHESIS;  Surgeon: Arsenio Katz, MD;  Location: MAIN OR [facility];  Service: Plastics   ??? PR REVISE BREAST RECONSTRUCTION Left 09/24/2018    Procedure: R21 - REVISION OF RECONSTRUCTED  BREAST;  Surgeon: Arsenio Katz, MD;  Location: MAIN OR [facility];  Service: Plastics   ??? PR SUSPENSION OF BREAST Right 09/24/2018    Procedure: MASTOPEXY FOR SYMMETRY PURPOSES;  Surgeon: Arsenio Katz, MD;  Location: MAIN OR [facility];  Service: Plastics       Medications:  Current Outpatient Medications  Medication Sig Dispense Refill   ??? acetaminophen (TYLENOL) 325 MG tablet Take 325 mg by mouth every six (6) hours as needed for pain.     ??? ALPRAZolam (XANAX) 0.25 MG tablet Take 0.25 mg by mouth nightly as needed for sleep.     ??? capecitabine (XELODA) 500 MG tablet Take 3 tablets (1,500 mg total) by mouth Two (2) times a day . 14 days on then 7 days off , then repeat. 84 tablet 8   ??? ibuprofen (MOTRIN) 800 MG tablet Take 1 tablet (800 mg total) by mouth every eight (8) hours as needed for pain. 70 tablet 2   ??? loperamide (IMODIUM) 2 mg capsule Take 2 mg by mouth 4 (four) times a day as needed.     ??? LORazepam (ATIVAN) 0.5 MG tablet Take one-half to one tablet every 6 hours as needed for nausea/vomiting 30 tablet 0   ??? LORazepam (ATIVAN) 0.5 MG tablet Take 1 tablet (0.5 mg total) by mouth every eight (8) hours as needed for anxiety. 30 tablet 0   ??? pantoprazole (PROTONIX) 40 MG tablet Take 1 tablet (40 mg total) by mouth daily. 30 tablet 3   ??? sertraline (ZOLOFT) 100 MG tablet Take 1 and 1/2 tablets (150 mg total) by mouth daily. 45 tablet 1     No current facility-administered medications for this visit.        Allergies:  Allergies   Allergen Reactions   ??? Amoxicillin Other (See Comments)     EDEMA OF FACE       Family History:   Family History   Problem Relation Age of Onset   ??? Skin cancer Mother    ??? Breast cancer Neg Hx    ??? Colon cancer Neg Hx    ??? Endometrial cancer Neg Hx    ??? Ovarian cancer Neg Hx     The patient reports no family history for bleeding disorders or anesthetic problems.    Social History:  Social History     Socioeconomic History   ??? Marital status: Single     Spouse name: Not on file   ??? Number of children: Not on file   ??? Years of education: Not on file   ??? Highest education level: Not on file   Occupational History   ??? Not on file   Social Needs   ??? Financial resource strain: Not on file   ??? Food insecurity     Worry: Not on file     Inability: Not on file   ??? Transportation needs     Medical: Not on file     Non-medical: Not on file   Tobacco Use   ??? Smoking status: Never Smoker   ??? Smokeless tobacco: Never Used   Substance and Sexual Activity   ??? Alcohol use: Not Currently   ??? Drug use: Never   ??? Sexual activity: Not on file   Lifestyle   ??? Physical activity     Days per week: Not on file     Minutes per session: Not on file   ??? Stress: Not on file   Relationships   ??? Social Wellsite geologist on phone: Not on file     Gets together: Not on file     Attends religious service: Not on file     Active member of club or organization: Not on file     Attends meetings of clubs or organizations: Not on file  Relationship status: Not on file   Other Topics Concern   ??? Not on file   Social History Narrative    The patient is single.  She lives at home with her mother and brother       ROS:   Otherwise, 12 point review of systems was completed and is negative except as per HPI.      Vitals:   Vitals:    10/02/18 1258   BP: 131/77   Pulse: 86   Resp: 18   Temp: 36.3 ??C (97.4 ??F)   SpO2: 95%     ?  Physical Exam:   Gen: AA in NAD  HEENT: NCAT, EOMI, PERRL, Non icteric sclerae, MMM  CV: RRR  RESP: quiet respirations on room air  EXT: warm and well perfused  Breast: right native breast with grade 2-3 ptosis.  Left breast with skin now healing after implant placement, incision well approximated.  No surrounding erythema.  Ptosis on the left as well.  Volume on right larger than left, but similar in a bra.  ?  Impression and Plan:  Sheri [name] is here today as a post op check after TE to implant placement one week ago.  Incidentally biopsied area with IDC.  -Will discuss with Dr Dellis Anes as the alloderm has been removed from this area to biopsy; implant will likely be visible at the time of full thickness resection.  Would need irrigation and 3days of antibiotics post op; would like to be present for the closure of this due to the implant presence  -Would in the future discuss small mastopexy on the right (small volume reduction), and a crescenteric mastopexy on the left.          No follow-ups on file.

## 2018-10-07 ENCOUNTER — Institutional Professional Consult (permissible substitution): Admit: 2018-10-07 | Discharge: 2018-10-08 | Payer: MEDICAID | Attending: Medical Oncology | Primary: Medical Oncology

## 2018-10-07 DIAGNOSIS — C50412 Malignant neoplasm of upper-outer quadrant of left female breast: Principal | ICD-10-CM

## 2018-10-07 DIAGNOSIS — Z171 Estrogen receptor negative status [ER-]: Secondary | ICD-10-CM

## 2018-10-09 MED FILL — SERTRALINE 100 MG TABLET: 30 days supply | Qty: 45 | Fill #0 | Status: AC

## 2018-10-10 MED FILL — IBUPROFEN 800 MG TABLET: 23 days supply | Qty: 70 | Fill #0 | Status: AC

## 2018-10-10 MED FILL — IBUPROFEN 800 MG TABLET: ORAL | 23 days supply | Qty: 70 | Fill #0

## 2018-10-17 NOTE — Unmapped (Signed)
Second attempt to sched appt.    No answer, mailbox full.

## 2018-10-17 NOTE — Unmapped (Signed)
TC to schedule Clinic Eval around pt's COVID testing on 8/25.    Voicemail is full, UTLM

## 2018-10-17 NOTE — Unmapped (Signed)
Talked to patient today, she denies needing refills at this time.  She has one more surgery 10/23/2018 then in two weeks  She may possibly restart capecitabine.  Call 10/31/2018 to check on her progress.

## 2018-10-21 ENCOUNTER — Ambulatory Visit: Admit: 2018-10-21 | Discharge: 2018-10-21 | Payer: MEDICAID | Attending: Family | Primary: Family

## 2018-10-21 ENCOUNTER — Encounter: Admit: 2018-10-21 | Discharge: 2018-10-21 | Payer: MEDICAID

## 2018-10-21 DIAGNOSIS — Z01818 Encounter for other preprocedural examination: Principal | ICD-10-CM

## 2018-10-21 DIAGNOSIS — Z1159 Encounter for screening for other viral diseases: Principal | ICD-10-CM

## 2018-10-21 DIAGNOSIS — Z171 Estrogen receptor negative status [ER-]: Secondary | ICD-10-CM

## 2018-10-21 DIAGNOSIS — C50412 Malignant neoplasm of upper-outer quadrant of left female breast: Secondary | ICD-10-CM

## 2018-10-21 LAB — HEPATIC FUNCTION PANEL
ALKALINE PHOSPHATASE: 137 U/L — ABNORMAL HIGH (ref 38–126)
ALT (SGPT): 23 U/L (ref <35)
AST (SGOT): 18 U/L (ref 14–38)
BILIRUBIN DIRECT: <0.1 mg/dL (ref 0.00–0.40)

## 2018-10-21 LAB — CBC W/ AUTO DIFF
BASOPHILS ABSOLUTE COUNT: 0 10*9/L (ref 0.0–0.1)
BASOPHILS RELATIVE PERCENT: 0.2 %
EOSINOPHILS ABSOLUTE COUNT: 0.2 10*9/L (ref 0.0–0.4)
EOSINOPHILS RELATIVE PERCENT: 1.7 %
HEMATOCRIT: 38.2 % (ref 36.0–46.0)
LARGE UNSTAINED CELLS: 1 % (ref 0–4)
LYMPHOCYTES ABSOLUTE COUNT: 0.8 10*9/L — ABNORMAL LOW (ref 1.5–5.0)
LYMPHOCYTES RELATIVE PERCENT: 8 %
MEAN CORPUSCULAR HEMOGLOBIN CONC: 32.3 g/dL (ref 31.0–37.0)
MEAN CORPUSCULAR HEMOGLOBIN: 27 pg (ref 26.0–34.0)
MEAN PLATELET VOLUME: 7.6 fL (ref 7.0–10.0)
MONOCYTES ABSOLUTE COUNT: 0.4 10*9/L (ref 0.2–0.8)
MONOCYTES RELATIVE PERCENT: 4.3 %
NEUTROPHILS ABSOLUTE COUNT: 8 10*9/L — ABNORMAL HIGH (ref 2.0–7.5)
NEUTROPHILS RELATIVE PERCENT: 85.2 %
PLATELET COUNT: 334 10*9/L (ref 150–440)
RED BLOOD CELL COUNT: 4.58 10*12/L (ref 4.00–5.20)
RED CELL DISTRIBUTION WIDTH: 14.8 % (ref 12.0–15.0)
WBC ADJUSTED: 9.4 10*9/L (ref 4.5–11.0)

## 2018-10-21 LAB — AST (SGOT): Aspartate aminotransferase:CCnc:Pt:Ser/Plas:Qn:: 18

## 2018-10-21 LAB — RED CELL DISTRIBUTION WIDTH: Lab: 14.8

## 2018-10-21 NOTE — Unmapped (Signed)
COVID Pre-Procedure Intake Form     Assessment     Sheri [name] is a 23 y.o. female presenting to [facility] for COVID testing.     Plan     If no testing performed, pt counseled on routine care for respiratory illness.  If testing performed, COVID sent.  Patient directed to Home given findings during today's visit.    Subjective     Sheri [name] is a 23 y.o. female who [facility] the [facility] with complaints of the following:    Exposure History: In the last 21 days?     Have you traveled outside of [place]? No               Have you been in close contact with someone confirmed by a test to have COVID? (Close contact is within 6 feet for at least 10 minutes) No       Have you worked in a health care facility? No     Lived or worked facility like a nursing home, group home, or assisted living?    No         Are you scheduled to have surgery or a procedure in the next 3 days? Yes               Are you scheduled to receive cancer chemotherapy within the next 7 days?    No     Have you ever been tested before for COVID-19 with a swab of your nose? Yes: When: 09/2018, Where: [facility]   Are you a healthcare worker being tested so to return to work No     Right now,  do you have any of the following that developed over the past 7 days (as stated by patient on intake form):    Subjective fever (felt feverish) No   Chills (especially repeated shaking chills) No   Severe fatigue (felt very tired) No   Muscle aches No   Runny nose No   Sore throat No   Loss of taste or smell No   Cough (new onset or worsening of chronic cough) No   Shortness of breath No   Nausea or vomiting No   Headache No   Abdominal Pain No   Diarrhea (3 or more loose stools in last 24 hours) No       Scribe's Attestation: Paulita Fujita, FNP obtained and performed the history, physical exam and medical decision making elements that were entered into the chart.  Signed by Alleen Borne serving as Scribe, on 10/21/2018 3:02 PM      The documentation recorded by the scribe accurately reflects the service I personally performed and the decisions made by me. Aida Puffer, FNP  October 25, 2018 11:50 AM

## 2018-10-21 NOTE — Unmapped (Signed)
Per Anesthesia's guidelines:    Please take the following medications the morning of your procedure with a sip of water:      Clindamycin, sertraline, protonix, ativan as needed, tylenol as needed. Pt will review plan for Ibuprofen with anesthesia provider following RN interview

## 2018-10-22 DIAGNOSIS — T8542XA Displacement of breast prosthesis and implant, initial encounter: Principal | ICD-10-CM

## 2018-10-22 NOTE — Unmapped (Signed)
Adolescent and Young Adult Cancer Program Visit   Comprehensive Cancer Support Program     MOST AYA VISITS ARE BEING CONDUCTED REMOTELY VIA PHONE OR VIDEO.  THE AYA TEAM IS OFFSITE AND REMAINS AVAILABLE VIRTUALLY Monday - Thursday from 8am - 5pm. Available in-person on Fridays 8am - 5pm.    Service date: October 22, 2018    Encounter type: Virtual -??text message exchange  ??  Clinician:??[name], LCSWA  ??  Patient identifiers:??Sheri [name]??[name]??is a 23 y.o.??with breast cancer and a history of panic disorder and PTSD.??Tiasha Helvie??uses she/her/hers??pronouns.??    VISIT SUMMARY     Notes: Sent follow up text message to offer continued support and counseling. No response; will remain available.      I will continue to follow Xenia for AYA-appropriate support. Ernesteen has my contact information and has been encouraged to contact me as needed.     10/22/2018     [name], LCSWA  Adolescent and Young Adult Clinical Social Worker  Pager: [phone]  Available by page to conduct virtual care due to COVID-19

## 2018-10-23 ENCOUNTER — Encounter
Admit: 2018-10-23 | Discharge: 2018-10-23 | Payer: MEDICAID | Attending: Certified Registered" | Primary: Certified Registered"

## 2018-10-23 ENCOUNTER — Encounter: Admit: 2018-10-23 | Discharge: 2018-10-23 | Payer: MEDICAID

## 2018-10-23 DIAGNOSIS — T8542XA Displacement of breast prosthesis and implant, initial encounter: Principal | ICD-10-CM

## 2018-10-23 MED ORDER — POLYETHYLENE GLYCOL 3350 17 GRAM/DOSE ORAL POWDER
Freq: Every day | ORAL | 0 refills | 30.00000 days | Status: CP | PRN
Start: 2018-10-23 — End: 2018-11-22
  Filled 2018-10-23: qty 510, 30d supply, fill #0

## 2018-10-23 MED ORDER — CLINDAMYCIN HCL 300 MG CAPSULE
ORAL_CAPSULE | Freq: Three times a day (TID) | ORAL | 0 refills | 10.00000 days | Status: CP
Start: 2018-10-23 — End: 2018-11-02
  Filled 2018-10-23: qty 30, 10d supply, fill #0

## 2018-10-23 MED ORDER — ONDANSETRON 8 MG DISINTEGRATING TABLET
ORAL_TABLET | Freq: Three times a day (TID) | ORAL | 0 refills | 4 days | Status: CP | PRN
Start: 2018-10-23 — End: 2018-10-30
  Filled 2018-10-23: qty 10, 3d supply, fill #0

## 2018-10-23 MED ORDER — OXYCODONE 5 MG TABLET
ORAL_TABLET | ORAL | 0 refills | 1.00000 days | Status: CP | PRN
Start: 2018-10-23 — End: 2018-10-28
  Filled 2018-10-23: qty 5, 1d supply, fill #0

## 2018-10-23 MED FILL — POLYETHYLENE GLYCOL 3350 17 GRAM/DOSE ORAL POWDER: 30 days supply | Qty: 510 | Fill #0 | Status: AC

## 2018-10-23 MED FILL — OXYCODONE 5 MG TABLET: 1 days supply | Qty: 5 | Fill #0 | Status: AC

## 2018-10-23 MED FILL — ONDANSETRON 8 MG DISINTEGRATING TABLET: 3 days supply | Qty: 10 | Fill #0 | Status: AC

## 2018-10-23 MED FILL — CLINDAMYCIN HCL 300 MG CAPSULE: 10 days supply | Qty: 30 | Fill #0 | Status: AC

## 2018-10-23 NOTE — Unmapped (Signed)
Detailed Operative Note  (CSN: [id number])      Date of Surgery: 10/23/2018    Pre-op Diagnosis:   1. 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 Right chest wall excision of skin recurrence measuring 3 cm x 4 cm    Performing Service: Surgical Oncology  Surgeon(s) and Role:     * Melrose Kearse Kemper Durie, DO - Primary     * Lamarr Lulas - Plastic Surgeon - Assisting - Please see her detailed operative note for closure.    Findings: Right chest wall skin, subcutaneous tissue, and implant capsule removed, oriented, and sent to pathology. Additional margins obtained.    Anesthesia: Monitered Anestheisa Care    Estimated Blood Loss: 2 cc    Complications: None    Specimens:   1. Right chest wall excision of recurrent IDC, located at 2 o'clock, 7 CFN, Margins marked with ink. (Red=medial, Yellow= Inferior, Green= Lateral, Blue= Superior, Orange= Anterior, Black= Posterior). 1 clip was placed superior and 2 clips were placed laterally for mammographic identification.   2. Right superior margin, new margin marked per protocol  3. Right medial margin, new margin marked per protocol   4. Right lateral margin, new margin marked per protocol  5. Right inferior margin, new margin marked per protocol    Detailed Operative Note:   Indications: Sheri [name], a 23 y.o. female developed a biopsy proven right chest wall and skin recurrent infiltrating ductal carcinoma located at the prior biopsy site scar. After discussion of alternatives, the patient elected chest wall and skin excision of the recurrence. Dr Lafayette Dragon is her plastic surgeon and will evaluate the implant after excision and perform the closure.    Description of procedure: The patient was brought to the operating room and the side of surgery was verified.A time-out was completed verifying correct patient, procedure, site, positioning, and special equipment prior to beginning this procedure.  Anesthesia was induced. The breast and chest wall were then prepped and draped in the usual sterile fashion.     The chest wall / skin lesion was identified and the skin was marked with skin marker overlying the lesion. Dr Lafayette Dragon was present for incision planning. A local block was performed around the area using 0.25% marcaine plain. An elliptical incision overlying the lesion and incorporating the overlying skin was made. Dissection continued around the lesion down to the implant capsule. The area was tethered to the underlying implant capsule and the capsule was resected leaving the implant exposed. The lesion was resected intact and oriented. No additional masses were palpated. Additional margins were taken around the excision cavity. Of note the skin was excised so no additional anterior margin was obtained and the underlying implant capsule was excised so no additional posterior margin was obtained as the excision extended to the implant. At this point, the case was turned over to the plastic surgeon, Dr Lafayette Dragon. Please see her detailed operative report for her portion of the surgery.    At the end of this portion of the operation, all sponge, instrument, and needle counts x 2 were correct.    The patient tolerated the procedure well and was taken to the postanesthesia care unit in satisfactory condition.      Surgeon Notes: I was present and scrubbed for the entire procedure    [name], DO  10/23/2018  8:24 AM

## 2018-10-23 NOTE — Unmapped (Signed)
Brief Operative Note  (CSN: [id number])      Date of Surgery: 10/23/2018    Pre-op Diagnosis: R IDC, chest wall/skin recurrence    Post-op Diagnosis: Same    Procedure(s):  Panel 1  Priority EXCISION, TUMOR, SOFT TISSUE OF NECK OR ANTERIOR THORAX, SUBCUTANEOUS; 3 CM OR GREATER: 21552 (CPT??)  Panel 2  REPR COMPLX TRUNK; 2.6 CM TO 7.5 CM: 13101 (CPT??)  Note: Revisions to procedures should be made in chart - see Procedures activity.    Performing Service: Surgical Oncology Breast  Surgeon(s) and Role:  Panel 1:     * Holland Nickson Kemper Durie, DO - Primary  Panel 2:     * Arsenio Katz, MD - Primary    Assistant: None    Findings: Right skin and chest wall recurrence excised and sent to pathology.    Anesthesia: General    Estimated Blood Loss: 2    Complications: None    Specimens:   ID Type Source Tests Collected by Time Destination   1 : Left Chest wall excision at 2:00 oclock and 7 CFN inked to protocol  Tissue Breast, Left SURGICAL PATHOLOGY EXAM Talbert Cage, DO 10/23/2018 9811    2 : Left Chest wall excision Superior Margin Inked to protocol  Tissue Breast, Left SURGICAL PATHOLOGY EXAM Talbert Cage, DO 10/23/2018 0813    3 : Left Chest wall excision Inferior  Margin Inked to protocol  Tissue Breast, Left SURGICAL PATHOLOGY EXAM Talbert Cage, DO 10/23/2018 0818    4 : Left Chest wall excision Lateral  Margin Inked to protocol  Tissue Breast, Left SURGICAL PATHOLOGY EXAM Talbert Cage, DO 10/23/2018 0818    5 : Left Chest wall excision Medial  Margin Inked to protocol  Tissue Breast, Left SURGICAL PATHOLOGY EXAM Talbert Cage, DO 10/23/2018 0818        Implants: * No implants in log *    Surgeon Notes: I was present and scrubbed for the entire procedure    [name]   Date: 10/23/2018  Time: 8:22 AM

## 2018-10-29 NOTE — Unmapped (Signed)
Date: 10/23/2018    Preoperative Diagnosis: exposed left breast implant after additional excision of surgical margins  Postoperative Diagnosis: Same    Procedure performed: irrigation and multilayer closure of left breast wound measuring 6 cm x 2 cm x 1 cm; excision of 3 small areas at the IMF measuring 1 cm x 2 mm, 2 cm x 4 mm, 1 cm x 2 mm    Staff: Lamarr Lulas, MD  Resident: none    Anesthesia: MAC with local    Specimen: none from the PRS portion of the case    Indications: [name] is a 24 yo F with a history of left breast IDC, who underwent mastectomy with TE placement.  At the time of her placement of her permanent implant there was an area identified at the site of her previous biopsy that was sent for pathology. This revealed IDC.  She has been evaluated and plan for excision of the area today with Dr Dellis Anes; plastic surgery present for irrigation and closure due to proximity to the implant.    Description of the Procedure: [name] was identified in the preoperative holding area, where her history and physical exam were updated.  She was marked, and she was then transported back to the operating room where she was positioned supine on the OR table.  A time out was called and the appropriate patient, procedure, and laterality were confirmed.  Anesthesia was then induced via the anesthesia team.  Preoperative antibiotics were assured to be administered.  Bilateral arms were outstretched on arm boards, and secured with kerlix rolls to above the elbow.  All pressure points were assured to be padded.  The chest was prepped and draped in the usual sterile fashion.    Dr Dellis Anes performed her portion of the surgery, please see her note for full details.  The capsule was excised as the deep margin of the excision, with implant visible.  A small volume of thin serous fluid was removed from around the implant.  This was then irrigated with 1L of antibiotic irrigant.  The capsule was closed with 3-0 vicryl in a simple buried fashion. The deep dermis was approximated with 3-0 vicryl in a simple buried fashion.  The wound edges were approximated with 4-0 monocryl in a running subcuticular fashion.    There were three small areas of stitch abscess at the IMF.  These sutures were removed.  The skin was excised with a 15 blade scalpel, and the deep dermis approximated with the 4-0 monocryl in a simple buried fashion.  The skin edges were approximated with a 4-0 monocryl in a running subcuticular fashion.  A dressing of steristrips was then applied.      The patient tolerated the procedure well and was awakened and moved to the PACU in stable condition.  All instrument counts were correct at the conclusion of the case.

## 2018-10-29 NOTE — Unmapped (Signed)
Unable to leave a message, mail box full. 

## 2018-10-30 ENCOUNTER — Encounter: Admit: 2018-10-30 | Discharge: 2018-10-31 | Payer: MEDICAID

## 2018-10-30 DIAGNOSIS — Z9882 Breast implant status: Secondary | ICD-10-CM

## 2018-11-04 ENCOUNTER — Encounter: Admit: 2018-11-04 | Discharge: 2018-11-05 | Payer: MEDICAID

## 2018-11-04 ENCOUNTER — Ambulatory Visit: Admit: 2018-11-04 | Discharge: 2018-11-05 | Payer: MEDICAID | Attending: Adult Health | Primary: Adult Health

## 2018-11-04 DIAGNOSIS — C50412 Malignant neoplasm of upper-outer quadrant of left female breast: Secondary | ICD-10-CM

## 2018-11-04 DIAGNOSIS — G893 Neoplasm related pain (acute) (chronic): Secondary | ICD-10-CM

## 2018-11-04 DIAGNOSIS — Z171 Estrogen receptor negative status [ER-]: Secondary | ICD-10-CM

## 2018-11-04 LAB — COMPREHENSIVE METABOLIC PANEL
ALBUMIN: 4.2 g/dL (ref 3.5–5.0)
ALKALINE PHOSPHATASE: 139 U/L — ABNORMAL HIGH (ref 38–126)
ALT (SGPT): 29 U/L (ref <35)
ANION GAP: 9 mmol/L (ref 7–15)
AST (SGOT): 21 U/L (ref 14–38)
BILIRUBIN TOTAL: 0.4 mg/dL (ref 0.0–1.2)
BLOOD UREA NITROGEN: 12 mg/dL (ref 7–21)
BUN / CREAT RATIO: 20
CALCIUM: 9.4 mg/dL (ref 8.5–10.2)
CHLORIDE: 103 mmol/L (ref 98–107)
CO2: 24 mmol/L (ref 22.0–30.0)
CREATININE: 0.61 mg/dL (ref 0.60–1.00)
EGFR CKD-EPI NON-AA FEMALE: >90 mL/min/{1.73_m2} (ref >=60)
GLUCOSE RANDOM: 110 mg/dL (ref 70–179)
POTASSIUM: 3.9 mmol/L (ref 3.5–5.0)
PROTEIN TOTAL: 7.1 g/dL (ref 6.5–8.3)
SODIUM: 136 mmol/L (ref 135–145)

## 2018-11-04 LAB — CBC W/ AUTO DIFF
BASOPHILS ABSOLUTE COUNT: 0 10*9/L (ref 0.0–0.1)
BASOPHILS RELATIVE PERCENT: 0.1 %
EOSINOPHILS ABSOLUTE COUNT: 0.3 10*9/L (ref 0.0–0.4)
HEMOGLOBIN: 11.6 g/dL — ABNORMAL LOW (ref 12.0–16.0)
LARGE UNSTAINED CELLS: 1 % (ref 0–4)
LYMPHOCYTES ABSOLUTE COUNT: 1.4 10*9/L — ABNORMAL LOW (ref 1.5–5.0)
LYMPHOCYTES RELATIVE PERCENT: 18.9 %
MEAN CORPUSCULAR HEMOGLOBIN CONC: 32.7 g/dL (ref 31.0–37.0)
MEAN CORPUSCULAR HEMOGLOBIN: 26.9 pg (ref 26.0–34.0)
MEAN CORPUSCULAR VOLUME: 82.2 fL (ref 80.0–100.0)
MEAN PLATELET VOLUME: 7 fL (ref 7.0–10.0)
MONOCYTES ABSOLUTE COUNT: 0.4 10*9/L (ref 0.2–0.8)
MONOCYTES RELATIVE PERCENT: 5.8 %
NEUTROPHILS ABSOLUTE COUNT: 5.1 10*9/L (ref 2.0–7.5)
NEUTROPHILS RELATIVE PERCENT: 70.3 %
PLATELET COUNT: 314 10*9/L (ref 150–440)
RED BLOOD CELL COUNT: 4.33 10*12/L (ref 4.00–5.20)
RED CELL DISTRIBUTION WIDTH: 15.5 % — ABNORMAL HIGH (ref 12.0–15.0)
WBC ADJUSTED: 7.2 10*9/L (ref 4.5–11.0)

## 2018-11-04 LAB — GLUCOSE RANDOM: Glucose:MCnc:Pt:Ser/Plas:Qn:: 110

## 2018-11-04 LAB — PREGNANCY TEST URINE: Choriogonadotropin (pregnancy test):PrThr:Pt:Urine:Ord:: NEGATIVE

## 2018-11-04 LAB — PLATELET COUNT: Platelets:NCnc:Pt:Bld:Qn:Automated count: 314

## 2018-11-04 MED ORDER — IBUPROFEN 800 MG TABLET
ORAL_TABLET | Freq: Three times a day (TID) | ORAL | 2 refills | 24 days | Status: CP | PRN
Start: 2018-11-04 — End: 2019-11-04
  Filled 2019-01-09: qty 70, 24d supply, fill #0

## 2018-11-04 MED FILL — CAPECITABINE 500 MG TABLET: ORAL | 14 days supply | Qty: 84 | Fill #4

## 2018-11-04 MED FILL — CAPECITABINE 500 MG TABLET: 14 days supply | Qty: 84 | Fill #4 | Status: AC

## 2018-11-04 NOTE — Unmapped (Signed)
[facility] Clinical Assessment & Refill Coordination Note    Sheri [name], DOB: [birth year]  Phone: [phone] (home)     All above HIPAA information was verified with patient.     Specialty Medication(s):   Hematology/Oncology: Capecitabine 500mg , directions: 1500mg  twice daily for 14 days on and 7 days off     Current Outpatient Medications   Medication Sig Dispense Refill   ??? acetaminophen (TYLENOL) 325 MG tablet Take 325 mg by mouth every six (6) hours as needed for pain.     ??? ALPRAZolam (XANAX) 0.25 MG tablet Take 0.25 mg by mouth nightly as needed for sleep.     ??? capecitabine (XELODA) 500 MG tablet Take 3 tablets (1,500 mg total) by mouth Two (2) times a day . 14 days on then 7 days off , then repeat. 84 tablet 8   ??? ibuprofen (MOTRIN) 800 MG tablet Take 1 tablet (800 mg total) by mouth every eight (8) hours as needed for pain. 70 tablet 2   ??? loperamide (IMODIUM) 2 mg capsule Take 2 mg by mouth 4 (four) times a day as needed.     ??? LORazepam (ATIVAN) 0.5 MG tablet Take 1 tablet (0.5 mg total) by mouth every eight (8) hours as needed for anxiety. 30 tablet 0   ??? pantoprazole (PROTONIX) 40 MG tablet Take 1 tablet (40 mg total) by mouth daily. 30 tablet 3   ??? polyethylene glycol (GLYCOLAX) 17 gram/dose powder Mix 1 capful (17 grams) in 4 to 8 ounces of water,juice,tea,coffee,or soda and drink  by mouth daily as needed for constipation. (Patient not taking: Reported on 11/04/2018) 510 g 0   ??? sertraline (ZOLOFT) 100 MG tablet Take 1 and 1/2 tablets (150 mg total) by mouth daily. (Patient taking differently: Take 200 mg by mouth daily. ) 45 tablet 1     No current facility-administered medications for this visit.         Changes to medications: Yoshie reports no changes at this time.    Allergies   Allergen Reactions   ??? Amoxicillin Other (See Comments)     EDEMA OF FACE       Changes to allergies: No    SPECIALTY MEDICATION ADHERENCE     Capecitabine 500 mg: 0 days of medicine on hand Patient is restarting Capecitabine           Specialty medication(s) dose(s) confirmed: Regimen is correct and unchanged.     Are there any concerns with adherence? No    Adherence counseling provided? Not needed    CLINICAL MANAGEMENT AND INTERVENTION      Clinical Benefit Assessment:    Do you feel the medicine is effective or helping your condition? Yes    Clinical Benefit counseling provided? Not needed    Adverse Effects Assessment:    Are you experiencing any side effects? No    Are you experiencing difficulty administering your medicine? No    Quality of Life Assessment:    How many days over the past month did your condition/medication  keep you from your normal activities? For example, brushing your teeth or getting up in the morning. 0    Have you discussed this with your provider? Not needed    Therapy Appropriateness:    Is therapy appropriate? Yes, therapy is appropriate and should be continued    DISEASE/MEDICATION-SPECIFIC INFORMATION      N/A    PATIENT SPECIFIC NEEDS     ? Does the  patient have any physical, cognitive, or cultural barriers? No    ? Is the patient high risk? No     ? Does the patient require a Care Management Plan? No     ? Does the patient require physician intervention or other additional services (i.e. nutrition, smoking cessation, social work)? No      SHIPPING     Specialty Medication(s) to be Shipped:   Hematology/Oncology: Capecitabine 500mg , directions: 1500mg  twice daily for 14 days on and 7 days off    Other medication(s) to be shipped: n/a     Changes to insurance: No    Delivery Scheduled: Yes, Expected medication delivery date: 11/05/18.     Medication will be delivered via Next Day Courier to the confirmed home address in [place] [facility].    The patient will receive a drug information handout for each medication shipped and additional FDA Medication Guides as required.  Verified that patient has previously received a Conservation officer, historic buildings.    All of the patient's questions and concerns have been addressed.    Rollen Sox   [facility] Specialty Pharmacist

## 2018-11-04 NOTE — Unmapped (Signed)
No show  This encounter was created in error - please disregard.

## 2018-11-04 NOTE — Unmapped (Signed)
Port accessed and flushed per protocol.
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
## 2018-11-05 NOTE — Unmapped (Signed)
Adolescent and Young Adult Cancer Program Visit   Comprehensive Cancer Support Program     MOST AYA VISITS ARE BEING CONDUCTED REMOTELY VIA PHONE OR VIDEO.  THE AYA TEAM IS OFFSITE AND REMAINS AVAILABLE VIRTUALLY Monday - Thursday from 8am - 5pm. Available in-person on Fridays 8am - 5pm.    Service date: November 05, 2018    Encounter type: Virtual -??text message exchange  ??  Clinician:??[name], LCSWA  ??  Patient identifiers:??Sheri [name]??Sheri [name]??is a 23 y.o.??with breast cancer and a history of panic disorder and PTSD.??Sheri [name]??uses she/her/hers??pronouns.??    VISIT SUMMARY     Notes: Sent text message as follow up to last week's discussion about scheduling counseling, and following referral from Sheri [name]. Last week, Sheri [name] had stated she would reach out to me when she was ready to schedule; had not heard from her yet except via Sheri [name].    Offered to schedule a time to talk. No response received yet; will continue to be available for support.      I will continue to follow Sheri [name] for AYA-appropriate support. Sheri [name] has my contact information and has been encouraged to contact me as needed.     11/05/2018     [name], LCSWA  Adolescent and Young Adult Clinical Social Worker  Pager: [phone]  Available by page to conduct virtual care due to COVID-19

## 2018-11-06 NOTE — Unmapped (Signed)
Pharmacist Medication Follow-Up  I called the patient to f/u restarting capecitabine.  The mailbox was full and I was unable to leave a VM.

## 2018-11-06 NOTE — Unmapped (Signed)
CC:     HPI:  Sheri [name] is a 23 y.o. female who is here today as a post op check after TE to implant on 7/29.  Biopsy of area on the lateral aspect of the flap, at previous biopsy site, was notable for invasive cancer.  She presented one week ago for reexcision of this area and irrigation and closure.  Overall she is doing well.  She notes the larger incision after the excision, but still feels generally satisfied with the appearance of this in contrast to her native breast.    Past Medical History:  Past Medical History:   Diagnosis Date   ??? Anxiety    ??? Depression    ??? Malignant neoplasm of upper-outer quadrant of left breast in female, estrogen receptor negative (CMS-HCC) 12/10/2017   ??? Panic disorder    ??? PTSD (post-traumatic stress disorder)        Past Surgical History:  Past Surgical History:   Procedure Laterality Date   ??? AUGMENTATION MAMMAPLASTY Left 08/2018    left only    ??? BREAST BIOPSY Left 2019    malignant   ??? CHEMOTHERAPY Left 11/2017    May 13 2018   ??? IR INSERT PORT AGE GREATER THAN 5 YRS  12/18/2017    IR INSERT PORT AGE GREATER THAN 5 YRS 12/18/2017 [name], MD IMG [facility]   ??? MASTECTOMY Left 05/2018   ??? PR BREAST RECONSTRUC W TISS EXPANDR Left 05/30/2018    Procedure: BREAST RECON IMMED/DELAY W/EXPANDR W/SUBSQT EXPA;  Surgeon: Arsenio Katz, MD;  Location: ASC OR [facility];  Service: Plastics   ??? PR BX/REMV,LYMPH NODE,DEEP AXILL Left 05/30/2018    Procedure: BX/EXC LYMPH NODE; OPEN, DEEP AXILRY NODE;  Surgeon: Talbert Cage, DO;  Location: ASC OR [facility];  Service: Surgical Oncology Breast   ??? PR EXC TUMOR SOFT TISSUE NECK/ANT THORAX SUBQ 3+CM Right 10/23/2018    Procedure: Priority EXCISION, TUMOR, SOFT TISSUE OF NECK OR ANTERIOR THORAX, SUBCUTANEOUS; 3 CM OR GREATER;  Surgeon: Talbert Cage, DO;  Location: ASC OR [facility];  Service: Surgical Oncology Breast   ??? PR IMPLNT BIO IMPLNT FOR SOFT TISSUE REINFORCEMENT Left 05/30/2018    Procedure: IMPLANTATION BIOLOGIC IMPLANT(EG, ACELLULAR DERMAL MATRIX) FOR SOFT TISSUE REINFORCEMENT(EG, BREAST, TRUNK);  Surgeon: Arsenio Katz, MD;  Location: ASC OR [facility];  Service: Plastics   ??? PR INSERT BREAST PROS IMMED AFTER EXCIS Left 05/30/2018    Procedure: IMMED INSRT BREAST PROSTH AFTER MASTOPEX/MASTECT;  Surgeon: Arsenio Katz, MD;  Location: ASC OR [facility];  Service: Plastics   ??? PR INTRAOPERATIVE SENTINEL LYMPH NODE ID W DYE INJECTION Left 05/30/2018    Procedure: INTRAOPERATIVE IDENTIFICATION SENTINEL LYMPH NODE(S) INCLUDE INJECTION NON-RADIOACTIVE DYE, WHEN PERFORMED;  Surgeon: Talbert Cage, DO;  Location: ASC OR [facility];  Service: Surgical Oncology Breast   ??? PR MASTECTOMY, SIMPLE, COMPLETE Left 05/30/2018    Procedure: Urgent MASTECTOMY, SIMPLE, COMPLETE;  Surgeon: Talbert Cage, DO;  Location: ASC OR [facility];  Service: Surgical Oncology Breast   ??? PR RECMPL WND TRUNK 2.6-7.5 CM Left 10/23/2018    Procedure: REPR COMPLX TRUNK; 2.6 CM TO 7.5 CM;  Surgeon: Arsenio Katz, MD;  Location: ASC OR [facility];  Service: Plastics   ??? PR REPLACE TISSUE EXPANDER Left 09/24/2018    Procedure: REPLACEMENT OF TISSUE EXPANDER WITH PERMANENT PROSTHESIS;  Surgeon: Arsenio Katz, MD;  Location: MAIN OR [facility];  Service: Plastics   ??? PR REVISE BREAST RECONSTRUCTION Left 09/24/2018  Procedure: R21 - REVISION OF RECONSTRUCTED  BREAST;  Surgeon: Arsenio Katz, MD;  Location: MAIN OR [facility];  Service: Plastics   ??? PR SUSPENSION OF BREAST Right 09/24/2018    Procedure: MASTOPEXY FOR SYMMETRY PURPOSES;  Surgeon: Arsenio Katz, MD;  Location: MAIN OR [facility];  Service: Plastics       Medications:  Current Outpatient Medications   Medication Sig Dispense Refill   ??? acetaminophen (TYLENOL) 325 MG tablet Take 325 mg by mouth every six (6) hours as needed for pain.     ??? ALPRAZolam (XANAX) 0.25 MG tablet Take 0.25 mg by mouth nightly as needed for sleep.     ??? capecitabine (XELODA) 500 MG tablet Take 3 tablets (1,500 mg total) by mouth Two (2) times a day . 14 days on then 7 days off , then repeat. 84 tablet 8   ??? ibuprofen (MOTRIN) 800 MG tablet Take 1 tablet (800 mg total) by mouth every eight (8) hours as needed for pain. 70 tablet 2   ??? loperamide (IMODIUM) 2 mg capsule Take 2 mg by mouth 4 (four) times a day as needed.     ??? LORazepam (ATIVAN) 0.5 MG tablet Take 1 tablet (0.5 mg total) by mouth every eight (8) hours as needed for anxiety. 30 tablet 0   ??? pantoprazole (PROTONIX) 40 MG tablet Take 1 tablet (40 mg total) by mouth daily. 30 tablet 3   ??? polyethylene glycol (GLYCOLAX) 17 gram/dose powder Mix 1 capful (17 grams) in 4 to 8 ounces of water,juice,tea,coffee,or soda and drink  by mouth daily as needed for constipation. (Patient not taking: Reported on 11/04/2018) 510 g 0   ??? sertraline (ZOLOFT) 100 MG tablet Take 1 and 1/2 tablets (150 mg total) by mouth daily. (Patient taking differently: Take 200 mg by mouth daily. ) 45 tablet 1     No current facility-administered medications for this visit.        Allergies:  Allergies   Allergen Reactions   ??? Amoxicillin Other (See Comments)     EDEMA OF FACE       Family History:   Family History   Problem Relation Age of Onset   ??? Skin cancer Mother    ??? Breast cancer Neg Hx    ??? Colon cancer Neg Hx    ??? Endometrial cancer Neg Hx    ??? Ovarian cancer Neg Hx     The patient reports no family history for bleeding disorders or anesthetic problems.    Social History:  Social History     Socioeconomic History   ??? Marital status: Single     Spouse name: Not on file   ??? Number of children: Not on file   ??? Years of education: Not on file   ??? Highest education level: Not on file   Occupational History   ??? Not on file   Social Needs   ??? Financial resource strain: Not on file   ??? Food insecurity     Worry: Not on file     Inability: Not on file   ??? Transportation needs     Medical: Not on file     Non-medical: Not on file   Tobacco Use   ??? Smoking status: Never Smoker   ??? Smokeless tobacco: Never Used   Substance and Sexual Activity   ??? Alcohol use: Not Currently   ??? Drug use: Never   ??? Sexual activity: Not on file   Lifestyle   ???  Physical activity     Days per week: Not on file     Minutes per session: Not on file   ??? Stress: Not on file   Relationships   ??? Social Wellsite geologist on phone: Not on file     Gets together: Not on file     Attends religious service: Not on file     Active member of club or organization: Not on file     Attends meetings of clubs or organizations: Not on file     Relationship status: Not on file   Other Topics Concern   ??? Not on file   Social History Narrative    The patient is single.  She lives at home with her mother and brother       ROS:   Otherwise, 12 point review of systems was completed and is negative except as per HPI.      Vitals:   Vitals:    10/30/18 1220   BP: 120/74   Pulse: 91   Resp: 18   Temp: 36.6 ??C (97.9 ??F)   SpO2: 97%     ?  Physical Exam:   Gen: AA in NAD  HEENT: NCAT, EOMI, PERRL, Non icteric sclerae, MMM  CV: RRR  RESP: quiet respirations on room air  EXT: warm and well perfused  Breast: right native breast with grade 2-3 ptosis. ??Left breast with skin now healing after implant placement, incision well approximated.  Steristrips removed, and incision (lateral) from the margin is doing well without surrounding erythema.  Ptosis on the left as well.  Volume on right larger than left, but similar in a bra.    ?  Impression and Plan:  Sheri [name] is here today as a post op check after TE to implant placement with additional area of IDC at the previous biopsy site; now one week s/p excision of margins.  -Continue restrictions  -continue post op bra  -RTC 1-2 weeks      No follow-ups on file.

## 2018-11-11 ENCOUNTER — Telehealth: Admit: 2018-11-11 | Discharge: 2018-11-12

## 2018-11-11 DIAGNOSIS — F411 Generalized anxiety disorder: Secondary | ICD-10-CM

## 2018-11-11 DIAGNOSIS — F3341 Major depressive disorder, recurrent, in partial remission: Secondary | ICD-10-CM

## 2018-11-11 DIAGNOSIS — F4310 Post-traumatic stress disorder, unspecified: Secondary | ICD-10-CM

## 2018-11-11 MED ORDER — ARIPIPRAZOLE 2 MG TABLET
ORAL_TABLET | Freq: Every day | ORAL | 1 refills | 30.00000 days | Status: CP
Start: 2018-11-11 — End: 2018-12-21
  Filled 2018-11-21: qty 30, 30d supply, fill #0

## 2018-11-11 MED ORDER — LORAZEPAM 1 MG TABLET
ORAL_TABLET | Freq: Three times a day (TID) | ORAL | 0 refills | 10 days | Status: CP | PRN
Start: 2018-11-11 — End: 2018-11-21
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
## 2018-11-12 ENCOUNTER — Ambulatory Visit: Admit: 2018-11-12 | Discharge: 2018-11-13

## 2018-11-12 DIAGNOSIS — Z171 Estrogen receptor negative status [ER-]: Secondary | ICD-10-CM

## 2018-11-12 DIAGNOSIS — Z09 Encounter for follow-up examination after completed treatment for conditions other than malignant neoplasm: Secondary | ICD-10-CM

## 2018-11-12 DIAGNOSIS — C50412 Malignant neoplasm of upper-outer quadrant of left female breast: Secondary | ICD-10-CM

## 2018-11-12 NOTE — Unmapped (Signed)
It was a pleasure to see you today in the Breast Surgical Oncology Clinic. Please call my nurse navigator, Lindwood Qua, if you have any interval questions or concerns.     1. Today we discussed your pathology and a copy was provided to you.  2. Follow up in 6 months for exam.  3. You will be scheduled with Dr. Rogelio Seen with radiation oncology for radiation discussion.  4. You will follow up with Dr. Lafayette Dragon for continued postoperative care.   5. Continue surveillance as outlined. NCCN recommends that you continue to see your primary care provider for all general health care recommended for a patient your age, including cancer screening tests.  Any symptoms should be brought to the attention of your provider.  You should see your medical oncologist every 3 to 6 months for the first 3 years, every 6 to 12 months for years 4 and 5, and annually thereafter. Visits with your surgeon and radiation oncologist should be rotated with your medical oncology team initially until you are released to medical oncology or a survivorship clinic. The surgery visit should typically be paired with the imaging appointment so that we can review mammogram findings and recommendations.       Surgical oncology contact information:  For appointments, please call [phone].     Nurse Navigator available Monday through Friday 8:30 am to 4:30 pm:   Lindwood Qua, RN:    Phone: [phone]   Fax: [phone] attention Vivia Budge    For emergencies, evenings or weekends, please call [phone] and ask for the surgical oncology resident on call. Please be aware that this person is also responding to in-hospital emergencies and patient issues and may not answer your phone call immediately, but will return your call as soon as possible.

## 2018-11-12 NOTE — Unmapped (Signed)
It was a pleasure meeting with you today. Here is our current plan:    (1) Continue sertraline 200 mg  (2) Start aripiprazole (Abilify) 2 mg  (3) OK to use lorazepam 1 mg as needed for intense anxiety or panic    I look forward to seeing you again in 4 weeks.    Best,  [name]    Comprehensive Cancer Support Program (CCSP) - Psychiatry Outpatient Clinic   After Visit Summary    It was a pleasure to see you today in the [facility]s Comprehensive Cancer Support Program (CCSP). The CCSP is a multidisciplinary program dedicated to helping patients, caregivers, and families with cancer treatment, recovery and survivorship.      To schedule, cancel, or change your appointment:  Please call the [facility] schedulers at [phone], Monday through Friday 8AM - 5PM.  Someone will return your call within 24 hours.      If you have a question about your medicines or you need to contact your provider:  Please call the CCSP program coordinator, Myrene Galas, at [phone].     For after hours urgent issues, you may call [phone] or call the I need to talk line at [phone]-TALK ([phone]) anytime 24/7.    CCSP Patient and Family Resource Center: [phone].    CCSP Website:  [email or web address]    For prescription refills, please allow at least 24 hours (during business hours, M-F) for providers to call in refills to your pharmacy. We are generally unable to accommodate same-day requests for refills.     If you are taking any controlled substances (such as anxiety or sleep medications), you must use them as the directions say to use them. We generally do not provide early refills over the phone without clear reason, and it would be inappropriate to obtain the medications from other doctors. We routinely use the [place] controlled substance database to monitor prescription drug use.       Patient Education        aripiprazole  Pronunciation:  AR i PIP ra zole  Brand: Abilify, Abilify Discmelt  What is the most important information I should know about aripiprazole?  Aripiprazole is not approved for use in older adults with dementia-related psychosis.  Some people have thoughts about suicide while taking aripiprazole. Stay alert to changes in your mood or symptoms. Report any new or worsening symptoms to your doctor.  Do not stop using aripiprazole suddenly, or you could have unpleasant withdrawal symptoms.  What is aripiprazole?  Aripiprazole is an antipsychotic medicine that is used to treat the symptoms of psychotic conditions such as schizophrenia and bipolar I disorder (manic depression). It is not known if aripiprazole is safe or effective in children younger than 18 with schizophrenia, or children younger than 10 with bipolar disorder.  Aripiprazole is also used together with other medicines to treat major depressive disorder in adults.  Aripiprazole is also used in children 6 years or older who have Tourette's disorder, or symptoms of autistic disorder (irritability, aggression, mood swings, temper tantrums, and self-injury).  Aripiprazole may also be used for purposes not listed in this medication guide.  What should I discuss with my healthcare provider before taking aripiprazole?  You should not take aripiprazole if you are allergic to it.  Aripiprazole may increase the risk of death in older adults with dementia-related psychosis and is not approved for this use.  Tell your doctor if you have ever had:  ?? liver or kidney disease;  ??  heart disease, high or low blood pressure, heart rhythm problems;  ?? high cholesterol or triglycerides (a type of fat in the blood);  ?? low white blood cell (WBC) counts;  ?? a heart attack or stroke;  ?? seizures or epilepsy;  ?? trouble swallowing;  ?? diabetes (in you or a family member); or  ?? obsessive-compulsive disorder, impulse-control disorder, or addictive behaviors.  Some people have thoughts about suicide while taking aripiprazole. Your doctor will need to check your progress at regular visits. Your family or other caregivers should also be alert to changes in your mood or symptoms.  The liquid form (oral solution) of this medication may contain up to 15 grams of sugar per dose. Before taking aripiprazole oral solution, tell your doctor if you have diabetes.  Aripiprazole may cause you to have high blood sugar (hyperglycemia). If you are diabetic, check your blood sugar levels on a regular basis while you are taking aripiprazole.  The orally disintegrating tablet form of this medication may contain over 3 milligrams of phenylalanine per tablet. Before taking Abilify Discmelt, tell your doctor if you have phenylketonuria.  Taking antipsychotic medicine in the last 3 months of pregnancy may cause problems in the newborn, such as withdrawal symptoms, breathing problems, feeding problems, fussiness, tremors, and limp or stiff muscles. However, you may have withdrawal symptoms or other problems if you stop taking your medicine during pregnancy. If you become pregnant, do not stop taking aripiprazole without your doctor's advice.  If you are pregnant, your name may be listed on a pregnancy registry to track the effects of aripiprazole on the baby.  It may not be safe to breastfeed while using this medicine. Ask your doctor about any risk.  How should I take aripiprazole?  Follow all directions on your prescription label and read all medication guides or instruction sheets. Your doctor may occasionally change your dose. Use the medicine exactly as directed.  Do not take aripiprazole for longer than 6 weeks unless your doctor has told you to.  Aripiprazole can be taken with or without food.  Swallow the regular tablet  whole and do not crush, chew, or break it.  Measure liquid medicine carefully. Use the dosing syringe provided, or use a medicine dose-measuring device (not a kitchen spoon).  Remove an orally disintegrating tablet from the package only when you are ready to take the medicine. Place the tablet in your mouth and allow it to dissolve, without chewing. Swallow several times as the tablet dissolves. If desired, you may drink liquid to help swallow the dissolved tablet.  Do not stop using aripiprazole suddenly, or you could have unpleasant withdrawal symptoms. Ask your doctor how to safely stop using this medicine.  Your doctor will need to check your progress on a regular basis.  Store at room temperature away from moisture and heat. Aripiprazole liquid may be used for up to 6 months after opening, but not after the expiration date on the medicine label.  What happens if I miss a dose?  Take the medicine as soon as you can, but skip the missed dose if it is almost time for your next dose. Do not take two doses at one time.  Get your prescription refilled before you run out of medicine completely.  What happens if I overdose?  Seek emergency medical attention or call the Poison Help line at [phone].  Overdose symptoms may include drowsiness, vomiting, aggression, confusion, tremors, fast or slow heart rate, seizure (convulsions), trouble breathing, or  fainting.  What should I avoid while taking aripiprazole?  Avoid getting up too fast from a sitting or lying position, or you may feel dizzy. Avoid driving or hazardous activity until you know how this medicine will affect you. Dizziness or drowsiness can cause falls, accidents, or severe injuries.  Avoid drinking alcohol. Dangerous side effects could occur.  Avoid becoming overheated or dehydrated. Drink plenty of fluids, especially in hot weather and during exercise. It is easier to become dangerously overheated and dehydrated while you are taking aripiprazole.  What are the possible side effects of aripiprazole?  Get emergency medical help if you have signs of an allergic reaction: hives; difficult breathing; swelling of your face, lips, tongue, or throat.  Call your doctor at once if you have: ?? severe agitation, distress, or restless feeling;  ?? twitching or uncontrollable movements of your eyes, lips, tongue, face, arms, or legs;  ?? mask-like appearance of the face, trouble swallowing, problems with speech;  ?? seizure (convulsions);  ?? thoughts about suicide or hurting yourself;  ?? severe nervous system reaction --very stiff (rigid) muscles, high fever, sweating, confusion, fast or uneven heartbeats, tremors, feeling like you might pass out;  ?? low blood cell counts --fever, chills, sore throat, mouth sores, skin sores, sore throat, cough, trouble breathing, feeling light-headed; or  ?? high blood sugar --increased thirst, increased urination, dry mouth, fruity breath odor.  You may have increased sexual urges, unusual urges to gamble, or other intense urges while taking this medicine. Talk with your doctor if this occurs.  Common side effects may include:  ?? uncontrolled muscle movements, anxiety, feeling restless;  ?? weight gain;  ?? nausea, vomiting, constipation;  ?? increased appetite;  ?? headache, dizziness, drowsiness, feeling tired;  ?? sleep problems (insomnia); or  ?? stuffy nose, sore throat.  This is not a complete list of side effects and others may occur. Call your doctor for medical advice about side effects. You may report side effects to FDA at 1-800-FDA-1088.  What other drugs will affect aripiprazole?  Taking aripiprazole with other drugs that make you drowsy or slow your breathing can cause dangerous side effects or death. Ask your doctor before using opioid medication, a sleeping pill, a muscle relaxer, or medicine for anxiety or seizures.  Many drugs can affect aripiprazole. This includes prescription and over-the-counter medicines, vitamins, and herbal products. Not all possible interactions are listed here. Tell your doctor about all your current medicines and any medicine you start or stop using.  Where can I get more information?  Your pharmacist can provide more information about aripiprazole.  Remember, keep this and all other medicines out of the reach of children, never share your medicines with others, and use this medication only for the indication prescribed.   Every effort has been made to ensure that the information provided by [employer]') is accurate, up-to-date, and complete, but no guarantee is made to that effect. Drug information contained herein may be time sensitive. [employer] information has been compiled for use by healthcare practitioners and consumers in the [place] and therefore [employer] does not warrant that uses outside of the [place] are appropriate, unless specifically indicated otherwise. [employer]'s drug information does not endorse drugs, diagnose patients or recommend therapy. [employer]'s drug information is an Armed forces technical officer designed to assist licensed healthcare practitioners in caring for their patients and/or to serve consumers viewing this service as a supplement to, and not a substitute for, the expertise, skill, knowledge and judgment  of healthcare practitioners. The absence of a warning for a given drug or drug combination in no way should be construed to indicate that the drug or drug combination is safe, effective or appropriate for any given patient. [employer] does not assume any responsibility for any aspect of healthcare administered with the aid of information [employer] provides. The information contained herein is not intended to cover all possible uses, directions, precautions, warnings, drug interactions, allergic reactions, or adverse effects. If you have questions about the drugs you are taking, check with your doctor, nurse or pharmacist.  Copyright [phone] [employer] Version: 12.02. Revision date: 04/02/2018.  Care instructions adapted under license by [facility]. If you have questions about a medical condition or this instruction, always ask your healthcare professional. [employer] disclaims any warranty or liability for your use of this information.

## 2018-11-12 NOTE — Unmapped (Signed)
Patient Name: Sheri [name]  Patient Age: 23 y.o.  Encounter Date: 11/12/2018    Referring Physician:   Rosemarie Beath, PA  [address]  [place],  [place] [address]    Primary Care Provider:  No PCP Per Patient    Breast Cancer Consulting Physicians:  Surgical Oncology: [name], DO  Surgical Oncology NP: Lowry Bowl, NP and Pollyann Samples, NP  Radiation Oncology: Rogelio Seen, MD  Medical Oncology: Marc Morgans, MD  Plastic Surgery: Lamarr Lulas, MD  Genetics: Lesia Hausen, [facility]      Reason for Visit: Post-operative visit    Diagnosis:   1. Left breast infiltrating ductal carcinoma  Cancer Staging  Malignant neoplasm of upper-outer quadrant of left breast in female, estrogen receptor negative (CMS-HCC)  Staging form: Breast, AJCC 8th Edition  - Clinical stage from 12/10/2017: Stage IIB (cT2, cN0(f), cM0, G3, ER+, PR-, HER2-) - Signed by Talbert Cage, DO on 12/10/2017  - Pathologic stage from 06/09/2018: No Stage Recommended (ypT1b, pN0(sn), cM0, G2, ER+, PR-, HER2-) - Signed by Rudie Meyer, ANP on 06/09/2018  - Pathologic stage from 11/12/2018: Stage IB (rpT1b, pN0, cM0, G3, ER-, PR-, HER2-) - Signed by Talbert Cage, DO on 11/13/2018      Procedure:   1. Left chest wall excision of local recurrence at biopsy site, performed on 10/23/2018.  2. Left skin-sparing mastectomy and??left??sentinel lymph node biopsy with tissue expander placement, performed on??05/30/2018.    Clinical Trial Participant:   1. No clinical trials at this time.    Interim History:  Sheri [name] returns to the office after the above noted procedure for her postoperative visit. She reports she is recovering well. Sheri [name] denies fevers, chills, significant pain, bruising, redness and wound drainage postoperatively. Sheri [name] reports she is doing well overall, and has no significant complaints. She is following up with Dr. Lafayette Dragon for postoperative care, and is scheduled to meet with Dr. Avis Epley soon via telemedicine.      Historically, Sheri [name] presented with Left breast IDC (+/-/-) discovered on self exam. She completed neoadjuvant chemotherapy, and underwent left skin-sparing??mastectomy and??left??sentinel lymph node biopsy with tissue expander placement, performed on??05/30/2018.??Upon TE exchange to implant (7/29), a suspicious skin lesion was noticed and biopsied w/ pathology showing disease recurrence necessisating chest wall excision (08/27)    Pathology Review:  Final pathology revealed infiltrating ductal carcinoma, Nottingham grade 3, estrogen receptor negative, progesterone receptor negative and H2N negative (0) measuring 1 cm in greatest dimension, final margins are clear. This was reviewed with Sheri [name]. We discussed the significance of this finding at great length. She states her understanding of this pathology report.  A copy of the pathology report was provided to Sheri [name].     Oncology History Overview Note   Left breast cT2 N0 IDC, G3, weakly ER+, PR-, HER-     Malignant neoplasm of upper-outer quadrant of left breast in female, estrogen receptor negative (CMS-HCC)   12/03/2017 -  Presenting Symptoms    Presented with self detected left breast mass x 3 weeks.  MMG/US: Left breast irregular 2.7 x 3.2 cm mass. There are faint microcalcifications associated with the mass and mild surrounding trabecular thickening is suggestive of edema.  There are 2 hyperdense and enlarged left axillary lymph nodes. No other suspicious findings are seen in the left breast.     12/03/2017 -  Other    Clinic exam: Left breast in the 1 to 2 o'clock position there is a 4 cm  irregular mass. Subtle pink erythema without pitting. Left axilla with a palpable 2 cm lymph node.     12/03/2017 Biopsy    Left breast USG core biopsy: IDC, G3, ER+(5%), PR-(0), HER2-(0)  Left axillary USG core biopsy: fragments of LN negative for carcinoma     12/10/2017 Initial Diagnosis    Malignant neoplasm of upper-outer quadrant of left breast in female, estrogen receptor negative (CMS-HCC)     12/10/2017 Tumor Board    MDC recs: Left breast cT2 N0 IDC, G3, +/-/-. Very suspicious LN on imaging and exam. LN core biopsy negative. Discussed considering repeat core (although needle is definitively in the node) vs outback SLN. Will receive NACT. If node neg, plan for outback SLN. Node positivity would affect radiation recs if patient has mastectomy. Meeting genetics today. Will discuss fertility. Also discussed concerns for XRT in young patient in 20's - higher risk of complications from XRT long-term. Consider staging studies.     12/10/2017 -  Cancer Staged    Staging form: Breast, AJCC 8th Edition  - Clinical stage from 12/10/2017: Stage IIB (cT2, cN0(f), cM0, G3, ER+, PR-, HER2-) - Signed by Talbert Cage, DO on 12/10/2017       12/11/2017 Biopsy    Repeat left axillary core biopsy given clinical suspicion: LN negative for carcinoma     12/24/2017 - 02/17/2018 Chemotherapy    OP BREAST AC (DOSE DENSE)  DOXOrubicin 60 mg/m2, Cyclophosphamide 600 mg/m2 every 14 days     01/08/2018 Genetics    Patient has genetic testing done for breast cancer.  Negative [facility] Breast Cancer Guidelines-Based Panel.     02/25/2018 -  Chemotherapy    OP BREAST PACLItaxel WEEKLY / CARBOplatin EVERY 3 WEEKS  PACLItaxel 80 mg/m2 IV weekly   CARBOplatin AUC 6 IV every 3 weeks   21-day cycle x 4 cycles      05/30/2018 Surgery    Left SSM: Residual IDC, Gr 2 +/-/- measuring 8mm.  Final margins are clear.  0/4 SLN with metastatic disease  Residual Cancer Burden   Tumor bed size: 8 mm x 7 mm  Overall tumor cellularity: 40%  Percentage in situ: 0%  Number of involved lymph nodes: 0  Diameter of largest metastasis: 0 mm  Residual Cancer Burden Score: 1.687  Residual Cancer Burden Class: RCB-II     06/09/2018 -  Cancer Staged    Staging form: Breast, AJCC 8th Edition  - Pathologic stage from 06/09/2018: No Stage Recommended (ypT1b, pN0(sn), cM0, G2, ER+, PR-, HER2-) - Signed by Rudie Meyer, ANP on 06/09/2018       10/23/2018 Surgery    Left chest wall excision by Dr. Dellis Anes. Final Path: IDC Grade 3 ER-PR- HER2- measuring 1 cm in greatest dimension. Final Margins clear.          Physical Exam:  Blood pressure 126/81, pulse 97, temperature 37.2 ??C (99 ??F), temperature source Oral, resp. rate 16, height 167.6 cm (5' 6), weight (!) 136.9 kg (301 lb 14.4 oz), SpO2 96 %, not currently breastfeeding.  General Appearance: No acute distress, well appearing and well nourished.   Breast: Well healed scar at the left breast inframmamary fold. Nipple everted. Left UOQ breast excision site  with steri strip in place, clean, dry; no surrounding erythema, palable lumps/masses, hematoma or seroma. R breast w/o palpable mass or lump, nipple is everted       Assessment:  Sheri [name] is a 23 y.o. female with a left breast infiltrating ductal  carcinoma, estrogen receptor negative, progesterone receptor negative and HER2 negative w/ L chest wall recurrence who is recovering well after left chest wall excision on 10/23/2018.   Cancer Staging  Malignant neoplasm of upper-outer quadrant of left breast in female, estrogen receptor negative (CMS-HCC)  Staging form: Breast, AJCC 8th Edition  - Clinical stage from 12/10/2017: Stage IIB (cT2, cN0(f), cM0, G3, ER+, PR-, HER2-) - Signed by Talbert Cage, DO on 12/10/2017  - Pathologic stage from 06/09/2018: No Stage Recommended (ypT1b, pN0(sn), cM0, G2, ER+, PR-, HER2-) - Signed by Rudie Meyer, ANP on 06/09/2018  - Pathologic stage from 11/12/2018: Stage IB (rpT1b, pN0, cM0, G3, ER-, PR-, HER2-) - Signed by Talbert Cage, DO on 11/13/2018      Plan:  The pathology was discussed and a copy was provided to her. The type of tumor, grade and tumor markers have been discussed.  The patient is referred to meet with Dr. Rogelio Seen with radiation oncology to discuss radiation therapy.  She will continue to see Dr. Avis Epley for her capecitabine therapy  The patient is instructed to return in 6 months for follow up with an exam. I will defer to Dr. Lafayette Dragon for continued postoperative care concerning her excision site.       At the conclusion of our visit all of Sheri [name]'s questions and concerns were addressed and arrangements were made for follow up US discussed above. She was instructed call us with any further questions, concerns or issues.    Cancer Staging  Malignant neoplasm of upper-outer quadrant of left breast in female, estrogen receptor negative (CMS-HCC)  Staging form: Breast, AJCC 8th Edition  - Clinical stage from 12/10/2017: Stage IIB (cT2, cN0(f), cM0, G3, ER+, PR-, HER2-) - Signed by Talbert Cage, DO on 12/10/2017  - Pathologic stage from 06/09/2018: No Stage Recommended (ypT1b, pN0(sn), cM0, G2, ER+, PR-, HER2-) - Signed by Rudie Meyer, ANP on 06/09/2018  Staging comments: Residual Cancer Burden   Tumor bed size: 8 mm x 7 mm  Overall tumor cellularity: 40%  Percentage in situ: 0%  Number of involved lymph nodes: 0  Diameter of largest metastasis: 0 mm  Residual Cancer Burden Score: 1.687  Residual Cancer Burden Class: RCB-II    Scribe's Attestation: [name], DO obtained and performed the history, physical exam and medical decision making elements that were entered into the chart.  Signed by Gretchen Short, Scribe, on November 12, 2018 9:27 AM    ----------------------------------------------------------------------------------------------------------------------  November 12, 2018 3:50 PM. Documentation assistance provided by the Scribe. I was present during the time the encounter was recorded. The information recorded by the Scribe was done at my direction and has been reviewed and validated by me.  ----------------------------------------------------------------------------------------------------------------------    Initial note by:  Lannie Fields, MD  Plastic Surgery, PGY-1    Attending Addendum:  After the resident's examination, I personally repeated the examination and reviewed the history with the above findings. I agree with the resident's note and have edited the note and exam appropriately and have contributed to it.  All of the patient's and family's questions and concerns were addressed during the visit and they are in agreement with the plan of care. Please do not hesitate to contact me with questions or concerns.    Royann Shivers, DO  Surgical Breast Oncology and Oncoplastics  11/12/2018  2:45 PM

## 2018-11-13 ENCOUNTER — Encounter: Admit: 2018-11-13 | Discharge: 2018-11-14 | Payer: MEDICAID

## 2018-11-13 DIAGNOSIS — Z9882 Breast implant status: Secondary | ICD-10-CM

## 2018-11-14 ENCOUNTER — Encounter
Admit: 2018-11-14 | Discharge: 2018-11-26 | Payer: MEDICAID | Attending: Radiation Oncology | Primary: Radiation Oncology

## 2018-11-14 ENCOUNTER — Encounter: Admit: 2018-11-14 | Discharge: 2018-11-26 | Payer: MEDICAID

## 2018-11-14 DIAGNOSIS — Z170 Estrogen receptor positive status [ER+]: Secondary | ICD-10-CM

## 2018-11-14 DIAGNOSIS — Z808 Family history of malignant neoplasm of other organs or systems: Secondary | ICD-10-CM

## 2018-11-14 DIAGNOSIS — R590 Localized enlarged lymph nodes: Secondary | ICD-10-CM

## 2018-11-14 DIAGNOSIS — C50412 Malignant neoplasm of upper-outer quadrant of left female breast: Secondary | ICD-10-CM

## 2018-11-14 NOTE — Unmapped (Deleted)
Radiation Oncology Initial Visit Note    Patient Name: Sheri [name]  Patient Age: 23 y.o.  Encounter Date: 11/20/2018    Referring Physician:   ***      Assessment:   Sheri [name] is a 23 y.o. female with a history of cT2N0 IDC of the left breast, grade 3, weakly ER+, PR-/HER2- with suspicious lymph nodes on imaging but core biospy negative s/p neoadjuvant ddAC-T followed by left mastectomy + reconstruction with SLNB in 05/2018 showing ypT1bN0 disease, no LVI. She then started adjuvant xeloda and developed locally recurrent disease in the chest wall a few months later, s/p excision 10/23/18 showing recurrent IDC measuring 1cm, grade 3, ER- PR- HER2-. She now presents for discussion of PMRT in the setting of locally recurrent triple negative disease.       Recommendations:  We reviewed the natural history of breast cancer and the therapeutic options available. She has also discussed treatment options with Dr. xxx today in our multidisciplinary clinic.       We went on to briefly review the risks and potential side effects of radiation to the breast/chest wall and regional nodes, which include but are not limited to in the short-term, fatigue, dermatitis, desquamation, and edema of the breast; and in the long-term, pneumonitis, tissue fibrosis, cosmetic changes, lymphedema, cardiac disease and a small risk of secondary malignancy. Due to the left sided nature of her disease and young age, we discussed her risk of cardiac toxicity and potential strategies that could be employed to protect her heart, including the possibility of a deep inspiration breath hold technique.    . In the meantime, she has been encouraged to contact our office with any questions or concerns.       Plan Summary:    --------------------------------------------------------------------------------------------------------------------------      History of Present Illness: Information pertinent to today's evaluation are the following:    Oncology History Overview Note   Left breast cT2 N0 IDC, G3, weakly ER+, PR-, HER-     Malignant neoplasm of upper-outer quadrant of left breast in female, estrogen receptor negative (CMS-HCC)   12/03/2017 -  Presenting Symptoms    Presented with self detected left breast mass x 3 weeks.  MMG/US: Left breast irregular 2.7 x 3.2 cm mass. There are faint microcalcifications associated with the mass and mild surrounding trabecular thickening is suggestive of edema.  There are 2 hyperdense and enlarged left axillary lymph nodes. No other suspicious findings are seen in the left breast.     12/03/2017 -  Other    Clinic exam: Left breast in the 1 to 2 o'clock position there is a 4 cm irregular mass. Subtle pink erythema without pitting. Left axilla with a palpable 2 cm lymph node.     12/03/2017 Biopsy    Left breast USG core biopsy: IDC, G3, ER+(5%), PR-(0), HER2-(0)  Left axillary USG core biopsy: fragments of LN negative for carcinoma     12/10/2017 Initial Diagnosis    Malignant neoplasm of upper-outer quadrant of left breast in female, estrogen receptor negative (CMS-HCC)     12/10/2017 Tumor Board    MDC recs: Left breast cT2 N0 IDC, G3, +/-/-. Very suspicious LN on imaging and exam. LN core biopsy negative. Discussed considering repeat core (although needle is definitively in the node) vs outback SLN. Will receive NACT. If node neg, plan for outback SLN. Node positivity would affect radiation recs if patient has mastectomy. Meeting genetics today. Will discuss fertility. Also discussed concerns for XRT  in young patient in 20's - higher risk of complications from XRT long-term. Consider staging studies.     12/10/2017 -  Cancer Staged    Staging form: Breast, AJCC 8th Edition  - Clinical stage from 12/10/2017: Stage IIB (cT2, cN0(f), cM0, G3, ER+, PR-, HER2-) - Signed by Talbert Cage, DO on 12/10/2017       12/11/2017 Biopsy    Repeat left axillary core biopsy given clinical suspicion: LN negative for carcinoma 12/24/2017 - 02/17/2018 Chemotherapy    OP BREAST AC (DOSE DENSE)  DOXOrubicin 60 mg/m2, Cyclophosphamide 600 mg/m2 every 14 days     01/08/2018 Genetics    Patient has genetic testing done for breast cancer.  Negative [employer] Breast Cancer Guidelines-Based Panel.     02/25/2018 -  Chemotherapy    OP BREAST PACLItaxel WEEKLY / CARBOplatin EVERY 3 WEEKS  PACLItaxel 80 mg/m2 IV weekly   CARBOplatin AUC 6 IV every 3 weeks   21-day cycle x 4 cycles      05/30/2018 Surgery    Left SSM: Residual IDC, Gr 2 +/-/- measuring 8mm.  Final margins are clear.  0/4 SLN with metastatic disease  Residual Cancer Burden   Tumor bed size: 8 mm x 7 mm  Overall tumor cellularity: 40%  Percentage in situ: 0%  Number of involved lymph nodes: 0  Diameter of largest metastasis: 0 mm  Residual Cancer Burden Score: 1.687  Residual Cancer Burden Class: RCB-II     06/09/2018 -  Cancer Staged    Staging form: Breast, AJCC 8th Edition  - Pathologic stage from 06/09/2018: No Stage Recommended (ypT1b, pN0(sn), cM0, G2, ER+, PR-, HER2-) - Signed by Rudie Meyer, ANP on 06/09/2018       10/23/2018 Surgery    Left chest wall excision by Dr. Dellis Anes. Final Path: IDC Grade 3 ER-PR- HER2- measuring 1 cm in greatest dimension. Final Margins clear.      11/12/2018 -  Cancer Staged    Staging form: Breast, AJCC 8th Edition  - Pathologic stage from 11/12/2018: Stage IB (rpT1b, pN0, cM0, G3, ER-, PR-, HER2-) - Signed by Talbert Cage, DO on 11/13/2018             Prior Radiation Therapy:{yes/no:310449}  Pacemaker: {yes/no:310449}  Pregnancy status: {PREG FINDINGS:25555}    Review of Systems:  A 10 systems was negative except for pertinent positives noted in HPI.     PAST MEDICAL HISTORY:  Past Medical History:   Diagnosis Date   ??? Anxiety    ??? Depression    ??? Malignant neoplasm of upper-outer quadrant of left breast in female, estrogen receptor negative (CMS-HCC) 12/10/2017   ??? Panic disorder    ??? PTSD (post-traumatic stress disorder) FAMILY HISTORY:  Family History   Problem Relation Age of Onset   ??? Skin cancer Mother    ??? Breast cancer Neg Hx    ??? Colon cancer Neg Hx    ??? Endometrial cancer Neg Hx    ??? Ovarian cancer Neg Hx        SOCIAL HISTORY:   Social History     Socioeconomic History   ??? Marital status: Single     Spouse name: Not on file   ??? Number of children: Not on file   ??? Years of education: Not on file   ??? Highest education level: Not on file   Occupational History   ??? Not on file   Social Needs   ??? Financial resource strain: Not on  file   ??? Food insecurity     Worry: Not on file     Inability: Not on file   ??? Transportation needs     Medical: Not on file     Non-medical: Not on file   Tobacco Use   ??? Smoking status: Never Smoker   ??? Smokeless tobacco: Never Used   Substance and Sexual Activity   ??? Alcohol use: Not Currently   ??? Drug use: Never   ??? Sexual activity: Not on file   Lifestyle   ??? Physical activity     Days per week: Not on file     Minutes per session: Not on file   ??? Stress: Not on file   Relationships   ??? Social Wellsite geologist on phone: Not on file     Gets together: Not on file     Attends religious service: Not on file     Active member of club or organization: Not on file     Attends meetings of clubs or organizations: Not on file     Relationship status: Not on file   Other Topics Concern   ??? Not on file   Social History Narrative    The patient is single.  She lives at home with her mother and brother       Allergies   Allergen Reactions   ??? Amoxicillin Other (See Comments)     EDEMA OF FACE       Current Medications:  Current Outpatient Medications   Medication Sig Dispense Refill   ??? acetaminophen (TYLENOL) 325 MG tablet Take 325 mg by mouth every six (6) hours as needed for pain.     ??? ARIPiprazole (ABILIFY) 2 MG tablet Take 1 tablet (2 mg total) by mouth daily. 30 tablet 1   ??? capecitabine (XELODA) 500 MG tablet Take 3 tablets (1,500 mg total) by mouth Two (2) times a day . 14 days on then 7 days off , then repeat. 84 tablet 8   ??? ibuprofen (MOTRIN) 800 MG tablet Take 1 tablet (800 mg total) by mouth every eight (8) hours as needed for pain. 70 tablet 2   ??? loperamide (IMODIUM) 2 mg capsule Take 2 mg by mouth 4 (four) times a day as needed.     ??? LORazepam (ATIVAN) 1 MG tablet Take 1 tablet (1 mg total) by mouth every eight (8) hours as needed for anxiety. 30 tablet 0   ??? pantoprazole (PROTONIX) 40 MG tablet Take 1 tablet (40 mg total) by mouth daily. 30 tablet 3   ??? polyethylene glycol (GLYCOLAX) 17 gram/dose powder Mix 1 capful (17 grams) in 4 to 8 ounces of water,juice,tea,coffee,or soda and drink  by mouth daily as needed for constipation. (Patient not taking: Reported on 11/04/2018) 510 g 0   ??? sertraline (ZOLOFT) 100 MG tablet Take 1 and 1/2 tablets (150 mg total) by mouth daily. (Patient taking differently: Take 200 mg by mouth daily. ) 45 tablet 1     No current facility-administered medications for this visit.          Physical Exam:  Karnofsky Performance Status: {KARNOFSKY SCALE (=or>16) w/ ECOG EQUIVALENT:22519}  General:  No acute distress, alert and oriented X 4   Neuro:  Normal Gait and Cognition. CN II-XII focally intact  HEENT: Moist mucous membranes  Neck: Supple, midline  Cardio: Normal rate.  Lungs: Normal work of breathing  Breast: Examination in the sitting and recumbent  positions finds {DESC; GOOD/FAIR/POOR:18685} symmetry.  There is no retraction.  The chest wall is without abnormalities.  The lumpectomy cavity on the {RIGHT/LEFT:20294} breast is {Desc; soft/hard/firm/cystic:30165} without nodularity.  There are no nipple abnormalities, skin changes or masses bilaterally.  MSK: No joint pains or effusions  Psych: Normal mood and affect.  Converses clearly and emotionally appropriate.       RADIOLOGY: Imaging was personally reviewed as detailed in the history (see above).     PATHOLOGY:  Pathology report was personally reviewed as detailed in the HPI.       Deaven Urwin L. Baird Lyons, MD  Assistant Professor  Department of Radiation Oncology

## 2018-11-17 NOTE — Unmapped (Signed)
CC:     HPI:  Sheri Deanis a 23 y.o.??female??who is here today as a post op check after TE to implant on 7/29. ??Biopsy of area on the lateral aspect of the flap, at previous biopsy site, was notable for invasive cancer. ??She underwent reexcision of this area and irrigation and closure.  Overall she is doing well.  The steristrips have been used since her surgery and she feels the area is flat and doing well.  She would like at some point to have a symmetry procedure on the right.  She has met with Dr Dellis Anes about the margins, and is set to be assessed by radiation oncology in the near future.       Past Medical History:  Past Medical History:   Diagnosis Date   ??? Anxiety    ??? Depression    ??? Malignant neoplasm of upper-outer quadrant of left breast in female, estrogen receptor negative (CMS-HCC) 12/10/2017   ??? Panic disorder    ??? PTSD (post-traumatic stress disorder)        Past Surgical History:  Past Surgical History:   Procedure Laterality Date   ??? AUGMENTATION MAMMAPLASTY Left 08/2018    left only    ??? BREAST BIOPSY Left 2019    malignant   ??? CHEMOTHERAPY Left 11/2017    May 13 2018   ??? IR INSERT PORT AGE GREATER THAN 5 YRS  12/18/2017    IR INSERT PORT AGE GREATER THAN 5 YRS 12/18/2017 [name], MD IMG [facility]   ??? MASTECTOMY Left 05/2018   ??? PR BREAST RECONSTRUC W TISS EXPANDR Left 05/30/2018    Procedure: BREAST RECON IMMED/DELAY W/EXPANDR W/SUBSQT EXPA;  Surgeon: Arsenio Katz, MD;  Location: ASC OR [facility];  Service: Plastics   ??? PR BX/REMV,LYMPH NODE,DEEP AXILL Left 05/30/2018    Procedure: BX/EXC LYMPH NODE; OPEN, DEEP AXILRY NODE;  Surgeon: Talbert Cage, DO;  Location[facility] ASC [facility];  Service: Surgical Oncology Breast   ??? PR EXC TUMOR SOFT TISSUE NECK/ANT THORAX SUBQ 3+CM Right 10/23/2018    Procedure: Priority EXCISION, TUMOR, SOFT TISSUE OF NECK OR ANTERIOR THORAX, SUBCUTANEOUS; 3 CM OR GREATER;  Surgeon: Talbert Cage, DO;  Location: ASC OR [facility];  Service: Surgical Oncology Breast   ??? PR IMPLNT BIO IMPLNT FOR SOFT TISSUE REINFORCEMENT Left 05/30/2018    Procedure: IMPLANTATION BIOLOGIC IMPLANT(EG, ACELLULAR DERMAL MATRIX) FOR SOFT TISSUE REINFORCEMENT(EG, BREAST, TRUNK);  Surgeon: Arsenio Katz, MD;  Location: ASC OR [facility];  Service: Plastics   ??? PR INSERT BREAST PROS IMMED AFTER EXCIS Left 05/30/2018    Procedure: IMMED INSRT BREAST PROSTH AFTER MASTOPEX/MASTECT;  Surgeon: Arsenio Katz, MD;  Location: ASC OR [facility];  Service: Plastics   ??? PR INTRAOPERATIVE SENTINEL LYMPH NODE ID W DYE INJECTION Left 05/30/2018    Procedure: INTRAOPERATIVE IDENTIFICATION SENTINEL LYMPH NODE(S) INCLUDE INJECTION NON-RADIOACTIVE DYE, WHEN PERFORMED;  Surgeon: Talbert Cage, DO;  Location: ASC OR [facility];  Service: Surgical Oncology Breast   ??? PR MASTECTOMY, SIMPLE, COMPLETE Left 05/30/2018    Procedure: Urgent MASTECTOMY, SIMPLE, COMPLETE;  Surgeon: Talbert Cage, DO;  Location: ASC OR [facility];  Service: Surgical Oncology Breast   ??? PR RECMPL WND TRUNK 2.6-7.5 CM Left 10/23/2018    Procedure: REPR COMPLX TRUNK; 2.6 CM TO 7.5 CM;  Surgeon: Arsenio Katz, MD;  Location: ASC OR [facility];  Service: Plastics   ??? PR REPLACE TISSUE EXPANDER Left 09/24/2018    Procedure: REPLACEMENT OF TISSUE EXPANDER  WITH PERMANENT PROSTHESIS;  Surgeon: Arsenio Katz, MD;  Location: MAIN OR [facility];  Service: Plastics   ??? PR REVISE BREAST RECONSTRUCTION Left 09/24/2018    Procedure: R21 - REVISION OF RECONSTRUCTED  BREAST;  Surgeon: Arsenio Katz, MD;  Location: MAIN OR [facility];  Service: Plastics   ??? PR SUSPENSION OF BREAST Right 09/24/2018    Procedure: MASTOPEXY FOR SYMMETRY PURPOSES;  Surgeon: Arsenio Katz, MD;  Location: MAIN OR [facility];  Service: Plastics       Medications:  Current Outpatient Medications   Medication Sig Dispense Refill   ??? acetaminophen (TYLENOL) 325 MG tablet Take 325 mg by mouth every six (6) hours as needed for pain.     ??? ARIPiprazole (ABILIFY) 2 MG tablet Take 1 tablet (2 mg total) by mouth daily. 30 tablet 1   ??? capecitabine (XELODA) 500 MG tablet Take 3 tablets (1,500 mg total) by mouth Two (2) times a day . 14 days on then 7 days off , then repeat. 84 tablet 8   ??? ibuprofen (MOTRIN) 800 MG tablet Take 1 tablet (800 mg total) by mouth every eight (8) hours as needed for pain. 70 tablet 2   ??? loperamide (IMODIUM) 2 mg capsule Take 2 mg by mouth 4 (four) times a day as needed.     ??? LORazepam (ATIVAN) 1 MG tablet Take 1 tablet (1 mg total) by mouth every eight (8) hours as needed for anxiety. 30 tablet 0   ??? pantoprazole (PROTONIX) 40 MG tablet Take 1 tablet (40 mg total) by mouth daily. 30 tablet 3   ??? polyethylene glycol (GLYCOLAX) 17 gram/dose powder Mix 1 capful (17 grams) in 4 to 8 ounces of water,juice,tea,coffee,or soda and drink  by mouth daily as needed for constipation. (Patient not taking: Reported on 11/04/2018) 510 g 0   ??? sertraline (ZOLOFT) 100 MG tablet Take 1 and 1/2 tablets (150 mg total) by mouth daily. (Patient taking differently: Take 200 mg by mouth daily. ) 45 tablet 1     No current facility-administered medications for this visit.        Allergies:  Allergies   Allergen Reactions   ??? Amoxicillin Other (See Comments)     EDEMA OF FACE       Family History:   Family History   Problem Relation Age of Onset   ??? Skin cancer Mother    ??? Breast cancer Neg Hx    ??? Colon cancer Neg Hx    ??? Endometrial cancer Neg Hx    ??? Ovarian cancer Neg Hx     The patient reports no family history for bleeding disorders or anesthetic problems.    Social History:  Social History     Socioeconomic History   ??? Marital status: Single     Spouse name: Not on file   ??? Number of children: Not on file   ??? Years of education: Not on file   ??? Highest education level: Not on file   Occupational History   ??? Not on file   Social Needs   ??? Financial resource strain: Not on file   ??? Food insecurity     Worry: Not on file     Inability: Not on file   ??? Transportation needs Medical: Not on file     Non-medical: Not on file   Tobacco Use   ??? Smoking status: Never Smoker   ??? Smokeless tobacco: Never Used   Substance and Sexual Activity   ???  Alcohol use: Not Currently   ??? Drug use: Never   ??? Sexual activity: Not on file   Lifestyle   ??? Physical activity     Days per week: Not on file     Minutes per session: Not on file   ??? Stress: Not on file   Relationships   ??? Social Wellsite geologist on phone: Not on file     Gets together: Not on file     Attends religious service: Not on file     Active member of club or organization: Not on file     Attends meetings of clubs or organizations: Not on file     Relationship status: Not on file   Other Topics Concern   ??? Not on file   Social History Narrative    The patient is single.  She lives at home with her mother and brother       ROS:   Otherwise, 12 point review of systems was completed and is negative except as per HPI.      Vitals:   Vitals:    11/13/18 1311   BP: 139/73   Pulse: 95   Resp: 16   Temp: 36.3 ??C (97.4 ??F)   SpO2: 98%     ?  Physical Exam:   Gen: AA in NAD  HEENT: NCAT, EOMI, PERRL, Non icteric sclerae, MMM  CV: RRR  RESP: quiet respirations on room air  EXT: warm and well perfused  Breast: right native breast with grade 2-3 ptosis. ??Left breast with??skin now healing after implant placement, incision well approximated. ??Steristrips removed, and incision (lateral) from the margin is doing well without surrounding erythema. ??Ptosis on the left as well. ??Volume on right larger than left, but similar in a bra.    ?  Impression and Plan:  Sheri [name]??is here today as a post op check after TE to implant placement with additional area of IDC at the previous biopsy site; now s/p excision of margins.  -Continue restrictions  -continue post op bra  -RTC 4 weeks        No follow-ups on file.

## 2018-11-18 NOTE — Unmapped (Signed)
Radiation Oncology Initial Visit Note    Patient Name: Sheri [name]  Patient Age: 23 y.o.  Encounter Date: 11/21/2018    Referring Physician: Dr. Dellis Anes, Dr. Avis Epley    Assessment:  Sheri [name] is a 23 y.o. female with a history of cT2N0 IDC of the left breast, grade 3, weakly ER+, PR-/HER2- with suspicious lymph nodes on imaging but core biospy negative s/p neoadjuvant ddAC-->taxol/carboplatin followed by left skin sparing mastectomy + reconstruction with SLNB in 05/2018 showing ypT1bN0 disease, no LVI. She then started adjuvant xeloda and was found to have recurrent / persistent chest wall disease at the time of implant exchange at the biopsy site, s/p excision 10/23/18 showing IDC measuring 1cm, grade 3, ER- PR- HER2-, negative margins (closest <88mm at posterior margin) . 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   ??  Recommendations:  We reviewed the natural history of breast cancer and the therapeutic options available. We focused our conversation on locally recurrent or persistent disease after mastectomy and the role of PMRT. We explained that her initial cT2N0 IDC with ypT1bN0 disease found at the time of mastectomy did not warrant PMRT. However, with persistent / locally recurrent triple negative disease after mastectomy as she now has, we explained that her risk of locoregional recurrence is high enough to warrant PMRT. The overall goal of PMRT is to decrease the long-term risk of locoregional recurrence which can then translate into an increase in overall survival. Although she is young, we explained that the benefit of preventing a second locoregional recurrence now likely outweighs the potential risks of radiation to the left reconstructed breast and regional nodes.     We then reviewed the specifics of simulation, set up, and treatment.  We will likely use 3D-CRT to treat the breast and nodes, as IMRT would give a low dose bath to the lungs and heart, which we wish to avoid given her young age. Due to the left sided nature of her disease and young age, we also discussed her risk of cardiac toxicity and potential strategies that could be employed to protect her heart, including the possibility of a deep inspiration breath hold technique. Regarding dosing, we specifically recommend PMRT with 50 Gy in 25 fractions to the left breast and regional nodes with a 10 Gy boost the site of local recurrence.    We went on to briefly review the risks and potential side effects of radiation to the breast/chest wall and regional nodes, which include but are not limited to in the short-term, fatigue, dermatitis, desquamation, and edema of the breast; and in the long-term, pneumonitis, tissue fibrosis, cosmetic changes, lymphedema, cardiac disease, reconstruction complications including implant failure, and a small risk of secondary malignancy given her young age.     Charlestine asked many questions which were answered to her apparent satisfacition. She agreed with the plan for PMRT and signed informed consent. Simulation will take place later today, with treatment to start in ~2 weeks. We will speak to Dr. Avis Epley about holding xeloda during her radiation treatments. In the meantime, she has been encouraged to contact our office with any questions or concerns.   ????    Plan Summary:  -PMRT with 50 Gy in 25 fractions to left breast and regional nodes (axilla,SCV, IMNs) followed by 10 Gy boost in 5 fractions to site of local recurrence  -Simulation today  -Will touch base with Dr. Peggye Form team about  holding xeloda during RT   --------------------------------------------------------------------------------------------------------------------------      History of Present Illness: Information pertinent to today's evaluation are the following:     Sheri [name] is a 23 year old woman who presented with self detected left breast mass in 11/2017. Imaging showed a 2.7 x 3.2cm mass in the left breast as well as  2 hyperdense and enlarged left axillary nodes. Clinical exam showed a 4cm mass in the left breast, 1-2:00 position, with a palpable 2cm lymph node.     12/03/17: Biopsy of the left breast mass confirmed IDC, grade 3, ER 5%, PR-, HER2-. Left axillary core biopsy showed fragments of LN without carcinoma.     12/11/17: Repeat left axillary core biopsy returned as a lymph node, negative for carcinoma.     01/08/18: Genetics testing, with negative panel.     11/2017 - 04/2018: Neoadjuvant ddAC followed by taxol/carboplatin.     05/30/18: Left mastectomy with SLNB and immediate reconstruction under the care of Dr. Dellis Anes. Pathology showed residual IDC measuring 8mm, 40% cellularity,  grade 2, no LVI, negative margins, 0/4 SLNs (and none with treatment effect).      06/18/18: She then started adjuvant xeloda C1 and portion of C2 and was lost to follow up for ~2 months.     09/24/18: TE exchange to permanent implant.There was a small area of nodularity seen at the site of the old biopsy noted at the time of implant exchange. Pathology from nodule excision revealed invasive ductal carcinoma (measuring 7 mm), ER- PR- HER2-, similar in histology to her known breast cancer.  ??     09/29/18: Right breast tomo showed no suspicious findings, BI-RADS 1.     10/02/18: Restaging CT CAP showed no evidence of metastatic disease.    10/23/18: Left chest wall excision for locally recurrent / persistent disease in the UOQ at the prior biopsy site. Pathology showed 1cm IDC, grade 3, negative margins, triple-negative.     She now presents for discussion of PMRT and continues on xeloda in the meantime (plan for 5 total cycles).       Today, she is feeling well, without any pain in her breast, bleeding, nipple discharge, or new lumps or bumps. She has healed well from her most recent excision and has full ROM in her left arm.     Oncology History Overview Note   Left breast cT2 N0 IDC, G3, weakly ER+, PR-, HER- Malignant neoplasm of upper-outer quadrant of left breast in female, estrogen receptor negative (CMS-HCC)   12/03/2017 -  Presenting Symptoms    Presented with self detected left breast mass x 3 weeks.  MMG/US: Left breast irregular 2.7 x 3.2 cm mass. There are faint microcalcifications associated with the mass and mild surrounding trabecular thickening is suggestive of edema.  There are 2 hyperdense and enlarged left axillary lymph nodes. No other suspicious findings are seen in the left breast.     12/03/2017 -  Other    Clinic exam: Left breast in the 1 to 2 o'clock position there is a 4 cm irregular mass. Subtle pink erythema without pitting. Left axilla with a palpable 2 cm lymph node.     12/03/2017 Biopsy    Left breast USG core biopsy: IDC, G3, ER+(5%), PR-(0), HER2-(0)  Left axillary USG core biopsy: fragments of LN negative for carcinoma     12/10/2017 Initial Diagnosis    Malignant neoplasm of upper-outer quadrant of left breast in female, estrogen receptor negative (CMS-HCC)  12/10/2017 Tumor Board    MDC recs: Left breast cT2 N0 IDC, G3, +/-/-. Very suspicious LN on imaging and exam. LN core biopsy negative. Discussed considering repeat core (although needle is definitively in the node) vs outback SLN. Will receive NACT. If node neg, plan for outback SLN. Node positivity would affect radiation recs if patient has mastectomy. Meeting genetics today. Will discuss fertility. Also discussed concerns for XRT in young patient in 20's - higher risk of complications from XRT long-term. Consider staging studies.     12/10/2017 -  Cancer Staged    Staging form: Breast, AJCC 8th Edition  - Clinical stage from 12/10/2017: Stage IIB (cT2, cN0(f), cM0, G3, ER+, PR-, HER2-) - Signed by Talbert Cage, DO on 12/10/2017       12/11/2017 Biopsy    Repeat left axillary core biopsy given clinical suspicion: LN negative for carcinoma     12/24/2017 - 02/17/2018 Chemotherapy    OP BREAST AC (DOSE DENSE)  DOXOrubicin 60 mg/m2, Cyclophosphamide 600 mg/m2 every 14 days     01/08/2018 Genetics    Patient has genetic testing done for breast cancer.  Negative [facility] Breast Cancer Guidelines-Based Panel.     02/25/2018 -  Chemotherapy    OP BREAST PACLItaxel WEEKLY / CARBOplatin EVERY 3 WEEKS  PACLItaxel 80 mg/m2 IV weekly   CARBOplatin AUC 6 IV every 3 weeks   21-day cycle x 4 cycles      05/30/2018 Surgery    Left SSM: Residual IDC, Gr 2 +/-/- measuring 8mm.  Final margins are clear.  0/4 SLN with metastatic disease  Residual Cancer Burden   Tumor bed size: 8 mm x 7 mm  Overall tumor cellularity: 40%  Percentage in situ: 0%  Number of involved lymph nodes: 0  Diameter of largest metastasis: 0 mm  Residual Cancer Burden Score: 1.687  Residual Cancer Burden Class: RCB-II     06/09/2018 -  Cancer Staged    Staging form: Breast, AJCC 8th Edition  - Pathologic stage from 06/09/2018: No Stage Recommended (ypT1b, pN0(sn), cM0, G2, ER+, PR-, HER2-) - Signed by Rudie Meyer, ANP on 06/09/2018       10/23/2018 Surgery    Left chest wall excision by Dr. Dellis Anes. Final Path: IDC Grade 3 ER-PR- HER2- measuring 1 cm in greatest dimension. Final Margins clear.      11/12/2018 -  Cancer Staged    Staging form: Breast, AJCC 8th Edition  - Pathologic stage from 11/12/2018: Stage IB (rpT1b, pN0, cM0, G3, ER-, PR-, HER2-) - Signed by Talbert Cage, DO on 11/13/2018             Prior Radiation Therapy:no  Pacemaker: no  Pregnancy status: Will order pregnancy test prior to simulation - also advised regarding the danger of getting pregnant while receiving radiation     Review of Systems:  A 10 systems was negative except for pertinent positives noted in HPI.     PAST MEDICAL HISTORY:  Past Medical History:   Diagnosis Date   ??? Anxiety    ??? Depression    ??? Malignant neoplasm of upper-outer quadrant of left breast in female, estrogen receptor negative (CMS-HCC) 12/10/2017   ??? Panic disorder    ??? PTSD (post-traumatic stress disorder) FAMILY HISTORY:  Family History   Problem Relation Age of Onset   ??? Skin cancer Mother    ??? Breast cancer Neg Hx    ??? Colon cancer Neg Hx    ??? Endometrial cancer  Neg Hx    ??? Ovarian cancer Neg Hx        SOCIAL HISTORY:   Social History     Socioeconomic History   ??? Marital status: Single     Spouse name: Not on file   ??? Number of children: Not on file   ??? Years of education: Not on file   ??? Highest education level: Not on file   Occupational History   ??? Not on file   Social Needs   ??? Financial resource strain: Not on file   ??? Food insecurity     Worry: Not on file     Inability: Not on file   ??? Transportation needs     Medical: Not on file     Non-medical: Not on file   Tobacco Use   ??? Smoking status: Never Smoker   ??? Smokeless tobacco: Never Used   Substance and Sexual Activity   ??? Alcohol use: Not Currently   ??? Drug use: Never   ??? Sexual activity: Not on file   Lifestyle   ??? Physical activity     Days per week: Not on file     Minutes per session: Not on file   ??? Stress: Not on file   Relationships   ??? Social Wellsite geologist on phone: Not on file     Gets together: Not on file     Attends religious service: Not on file     Active member of club or organization: Not on file     Attends meetings of clubs or organizations: Not on file     Relationship status: Not on file   Other Topics Concern   ??? Not on file   Social History Narrative    The patient is single.  She lives at home with her mother and brother       Allergies   Allergen Reactions   ??? Amoxicillin Other (See Comments)     EDEMA OF FACE       Current Medications:  Current Outpatient Medications   Medication Sig Dispense Refill   ??? acetaminophen (TYLENOL) 325 MG tablet Take 325 mg by mouth every six (6) hours as needed for pain.     ??? ARIPiprazole (ABILIFY) 2 MG tablet Take 1 tablet (2 mg total) by mouth daily. 30 tablet 1   ??? capecitabine (XELODA) 500 MG tablet Take 3 tablets (1,500 mg total) by mouth Two (2) times a day . 14 days on then 7 days off , then repeat. 84 tablet 8   ??? ibuprofen (MOTRIN) 800 MG tablet Take 1 tablet (800 mg total) by mouth every eight (8) hours as needed for pain. 70 tablet 2   ??? loperamide (IMODIUM) 2 mg capsule Take 2 mg by mouth 4 (four) times a day as needed.     ??? LORazepam (ATIVAN) 1 MG tablet Take 1 tablet (1 mg total) by mouth every eight (8) hours as needed for anxiety. 30 tablet 0   ??? pantoprazole (PROTONIX) 40 MG tablet Take 1 tablet (40 mg total) by mouth daily. 30 tablet 3   ??? polyethylene glycol (GLYCOLAX) 17 gram/dose powder Mix 1 capful (17 grams) in 4 to 8 ounces of water,juice,tea,coffee,or soda and drink  by mouth daily as needed for constipation. (Patient not taking: Reported on 11/04/2018) 510 g 0   ??? sertraline (ZOLOFT) 100 MG tablet Take 1 and 1/2 tablets (150 mg total) by mouth daily. (Patient taking  differently: Take 200 mg by mouth daily. ) 45 tablet 1     No current facility-administered medications for this visit.          Physical Exam:  Karnofsky Performance Status:  90  General:  No acute distress, alert and oriented X 4   Neuro:  Normal Gait and Cognition. CN II-XII focally intact  HEENT: Moist mucous membranes  Neck: Supple, midline  Cardio: Normal rate.  Lungs: Normal work of breathing  Breast: Left reconstructed breast with well healed inframmary scar and recent excision scar from local recurrence in UOQ. No palpable bumps or abnormalities. No skin changes. Right breast without any palpable abnormalities or skin changes. No axillary adenopathy appreciated. bilaterally.   Psych: Normal mood and affect.  Converses clearly and emotionally appropriate.       RADIOLOGY: Imaging was personally reviewed as detailed in the history (see above).     PATHOLOGY:  Pathology report including most recent from her excision on 10/23/18 was personally reviewed as detailed in the HPI.       Shawnee Gambone L. Baird Lyons, MD  Assistant Professor  Department of Radiation Oncology

## 2018-11-21 DIAGNOSIS — Z808 Family history of malignant neoplasm of other organs or systems: Secondary | ICD-10-CM

## 2018-11-21 DIAGNOSIS — F4310 Post-traumatic stress disorder, unspecified: Secondary | ICD-10-CM

## 2018-11-21 DIAGNOSIS — F411 Generalized anxiety disorder: Secondary | ICD-10-CM

## 2018-11-21 DIAGNOSIS — C50912 Malignant neoplasm of unspecified site of left female breast: Principal | ICD-10-CM

## 2018-11-21 DIAGNOSIS — Z170 Estrogen receptor positive status [ER+]: Secondary | ICD-10-CM

## 2018-11-21 DIAGNOSIS — C50412 Malignant neoplasm of upper-outer quadrant of left female breast: Secondary | ICD-10-CM

## 2018-11-21 DIAGNOSIS — Z171 Estrogen receptor negative status [ER-]: Secondary | ICD-10-CM

## 2018-11-21 DIAGNOSIS — R590 Localized enlarged lymph nodes: Secondary | ICD-10-CM

## 2018-11-21 MED ORDER — LORAZEPAM 1 MG TABLET
ORAL_TABLET | Freq: Three times a day (TID) | ORAL | 0 refills | 10 days | Status: CP | PRN
Start: 2018-11-21 — End: ?
  Filled 2018-11-21: qty 30, 10d supply, fill #0

## 2018-11-21 MED FILL — LORAZEPAM 1 MG TABLET: 10 days supply | Qty: 30 | Fill #0 | Status: AC

## 2018-11-21 MED FILL — ARIPIPRAZOLE 2 MG TABLET: 30 days supply | Qty: 30 | Fill #0 | Status: AC

## 2018-11-21 NOTE — Unmapped (Signed)
Wt Readings from Last 3 Encounters:   11/21/18 (!) (P) 136.5 kg (301 lb)   11/13/18 (!) 136.5 kg (301 lb)   11/12/18 (!) 136.9 kg (301 lb 14.4 oz)     Temp Readings from Last 3 Encounters:   11/21/18 (P) 35.8 ??C (96.4 ??F) ((P) Temporal)   11/13/18 36.3 ??C (97.4 ??F) (Temporal)   11/12/18 37.2 ??C (99 ??F) (Oral)     BP Readings from Last 3 Encounters:   11/13/18 139/73   11/12/18 126/81   11/04/18 136/86     Pulse Readings from Last 3 Encounters:   11/13/18 95   11/12/18 97   11/04/18 86   11/21/2018      Subjective/Assessment/Recommendations:    1. Prior radiation: No prior radiation treatment  2. Pacemaker/defibrillator: No  3. Pregnancy: Yes - order urine test prior to starting radiation  4. Patient education: Patient education materials and attending physician packet provided  5. Meaningful use: Reviewed  6. Resources: Nutrition/dietitian, Surveyor, quantity and CCSP

## 2018-11-21 NOTE — Unmapped (Signed)
Radiation Oncology Treatment Planning Note    Patient Name: Sheri [name]  Patient Age: 23 y.o.  Date of Encounter: 11/21/2018    Diagnoses: 23 y.o.??female??with a history of cT2N0 IDC of the left breast, grade 3, weakly ER+, PR-/HER2- with suspicious lymph nodes on imaging??but core??biospy negative s/p neoadjuvant ddAC-->taxol/carboplatin followed by left skin sparing mastectomy + reconstruction with SLNB in 05/2018 showing ypT1bN0 disease, no LVI. She then started adjuvant xeloda and was found to have recurrent / persistent chest wall disease at the time of implant exchange at the biopsy site, s/p excision??10/23/18??showing??IDC measuring 1cm, grade 3, ER- PR- HER2-, negative margins (closest <37mm at posterior margin) . 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     Treatment Intent: curative.    CLINICAL TREATMENT PLANNING:     [name] has left sided breast cancer that is locally persistent/recurrent after mastectomy. Refer to the consult for full clinical details.    I plan to treat her with photons and electrons utilizing 3D CRT technique.     The radiation target area/treatment site will be the left reconstructed breast and regional lymph nodes including the IMNs, axilla, and SCV.     I will attempt to minimize the dose to the heart and lung.    The total radiation dose will be 5000 cGy at 200 cGy/fraction for a total of 25 fractions, treated once a day. This will be followed by a boost of 1000 cGy at 200 cGy/fraction for a total of 5 fractions, treated once a day to her locally recurrent excision site. Chemotherapy has been completed.    Technique Rationale:     3DCRT: 3D conformal therapy is necessary to increase dose conformity and to review DVH's and to review isodose distribution for lungs, heart and the chest wall.       Simulation Order: I requested radiation treatment planning CT scan to acquire information regarding patient's anatomy of the treatment site, radiation treatment target volumes, and adjacent normal organs. This is required to be done in patient's treatment position for radiation treatment planning and for patient's subsequent radiation treatment positioning and treatment setups.     Breath Hold DIBH: Daily VisionRT monitoring in conjunction with DIBH    Verification Simulation: I have ordered for the patient to come in for verification simulation on the treatment machine to ensure that information was transferred appropriately from the planning system to the treatment machine treatment and to verify patient setup, immobilization, and image guidance.    Image Guidance/Tracking: Weekly PORT films to verify agreement of treatment ports with original planned fields.     SIMULATION:    Type:  Initial simulation of the left reconstructed breast and regional nodes    Contrast: None    The patient was taken to the CT simulation room and placed in a supine position with customized immobilization and/or position devices including the slanted breast board. Catheters/markers were placed on surface anatomy of interest including scars, mid axilla and midline chest.  A CT scan was obtained through the pertinent area including the entire left breast and regional nodes. I approved the patient set up and reviewed the CT images; both are adequate. I tentatively plan to utilize multiple fields and conformal blocking. The number and use of treatment devices will be determined at the time of computerized planning.    I have placed an isocenter/localization point in three dimensions on these images.  This was  marked on the patient???s skin for subsequent radiation treatment set-up.  Additional details of the CT simulation are available in the departmental [facility] electronic medical record.  CT images were then transferred to the radiation treatment-planning computer for planning and dosimetry.

## 2018-11-21 NOTE — Unmapped (Signed)
Received message from pharmacy that there was an issue with the lorazepam prescription that I wrote last week and that patient never received the medication. New prescription requested and provided.

## 2018-11-24 NOTE — Unmapped (Signed)
Brief note.  Phone visit d/t COVID.    Scans negative  Surgery planned for 10/23/2018    Got food assistance in July. Started back on xeloda.  Living with her mom and friends. Feels safe.    Started her menses again.  Not sexually active. We talked about importance of contraception on chemo.    RTC ~ early September for labs.      I spent 11 minutes on the phone with the patient. I spent an additional 5 minutes on pre- and post-visit activities.     The patient was physically located in [place] or a state in which I am permitted to provide care. The patient and/or parent/guardian understood that s/he may incur co-pays and cost sharing, and agreed to the telemedicine visit. The visit was reasonable and appropriate under the circumstances given the patient's presentation at the time.    The patient and/or parent/guardian has been advised of the potential risks and limitations of this mode of treatment (including, but not limited to, the absence of in-person examination) and has agreed to be treated using telemedicine. The patient's/patient's family's questions regarding telemedicine have been answered.     If the visit was completed in an ambulatory setting, the patient and/or parent/guardian has also been advised to contact their provider???s office for worsening conditions, and seek emergency medical treatment and/or call 911 if the patient deems either necessary.

## 2018-11-27 ENCOUNTER — Ambulatory Visit: Admit: 2018-11-27 | Discharge: 2018-12-27 | Attending: Radiation Oncology | Primary: Radiation Oncology

## 2018-11-27 ENCOUNTER — Ambulatory Visit: Admit: 2018-11-27 | Discharge: 2018-12-27

## 2018-11-27 ENCOUNTER — Ambulatory Visit: Admit: 2018-11-27 | Discharge: 2018-12-27 | Payer: MEDICAID

## 2018-11-27 ENCOUNTER — Ambulatory Visit: Admit: 2018-11-27 | Discharge: 2018-12-27 | Attending: Internal Medicine | Primary: Internal Medicine

## 2018-12-01 NOTE — Unmapped (Signed)
Patient Sheri [name] was contacted today regarding her appointment. Unable to leave voicemail.

## 2018-12-09 NOTE — Unmapped (Signed)
Patient did not show up for our scheduled visit today. I tried to contact her through [employer], but she did not answer her phone and I was unable to leave a voice mail message because her mailbox was full.

## 2018-12-10 NOTE — Unmapped (Signed)
Patient Name: Sheri [name]  Patient Age: 23 y.o.  Encounter Date: 12/15/2018    Referring Physician:   Talbert Cage, DO  [address]  [id number]  [place],  [place] [address]    Primary Care Provider:  No PCP Per Patient    Breast Cancer Treatment Team:  Surgical Oncology: [name], DO  Surgical Oncology NP: Lowry Bowl, NP and Pollyann Samples, NP  Radiation Oncology: Rogelio Seen, MD  Medical Oncology: Marc Morgans, MD  Plastic Surgery: Lamarr Lulas, MD  Genetics: Lesia Hausen, [facility]    Reason for Visit: Breast cancer surveillance visit    HPI:  Sheri [name] is a 23 y.o. female who presents for followup after breast cancer surgery. Below is a synopsis of past and present health information pertinent to her breast cancer diagnosis, treatment and current health status. She is accompanied today by her boyfriend, Sheri [name] who also contributes to the history. Sheri [name] was last seen in clinic on 11/12/2018 for a postoperative visit. Since that visit she denies associated symptoms including pain, redness, fever, chills, breast mass, breast retraction, nipple inversion, breast swelling, nipple discharge, nausea, vomiting or weight loss. She started radiation last Friday under the direction of Dr. Baird Lyons.     Historically, Sheri [name] presented with Left breast IDC (+/-/-) discovered on self exam. She completed neoadjuvant chemotherapy, and underwent left skin-sparing??mastectomy and??left??sentinel lymph node biopsy with tissue expander placement, performed on??05/30/2018.??Upon TE exchange to implant (7/29), a suspicious skin lesion was noticed and biopsied w/ pathology showing disease recurrence necessisating chest wall excision (10/23/18).    Diagnosis:    1. Left breast infiltrating ductal carcinoma  Cancer Staging  Malignant neoplasm of upper-outer quadrant of left breast in female, estrogen receptor negative (CMS-HCC)  Staging form: Breast, AJCC 8th Edition  - Clinical stage from 12/10/2017: Stage IIB (cT2, cN0(f), cM0, G3, ER+, PR-, HER2-) - Signed by Talbert Cage, DO on 12/10/2017  - Pathologic stage from 06/09/2018: No Stage Recommended (ypT1b, pN0(sn), cM0, G2, ER+, PR-, HER2-) - Signed by Rudie Meyer, ANP on 06/09/2018  - Pathologic stage from 11/12/2018: Stage IB (rpT1b, pN0, cM0, G3, ER-, PR-, HER2-) - Signed by Talbert Cage, DO on 11/13/2018        Procedure:   1. Left Left chest wall excision of local recurrence at biopsy site, performed on??10/23/2018.  2. Left??skin-sparing mastectomy and??left??sentinel lymph node biopsy with tissue expander placement, performed on??05/30/2018.    Clinical Trial Participant:   1. No clinical trials at this time.    Medical Oncology: Neoadjuvant Chemotherapy and Endocrine Therapy with Marc Morgans, MD    Radiation Oncology: planning to begin adjuvant radiation with Rogelio Seen, MD    Genetics Evaluation: Genetic testing negative for mutation (12/2017)    Review of Systems:  10 organ systems reviewed and pertinent as noted in HPI.       Medical history reviewed as below. I have also reviewed additional records as provided.  Oncology History Overview Note   Left breast cT2 N0 IDC, G3, weakly ER+, PR-, HER-     Malignant neoplasm of upper-outer quadrant of left breast in female, estrogen receptor negative (CMS-HCC)   12/03/2017 -  Presenting Symptoms    Presented with self detected left breast mass x 3 weeks.  MMG/US: Left breast irregular 2.7 x 3.2 cm mass. There are faint microcalcifications associated with the mass and mild surrounding trabecular thickening is suggestive of edema.  There are 2 hyperdense and enlarged left axillary lymph nodes.  No other suspicious findings are seen in the left breast.     12/03/2017 -  Other    Clinic exam: Left breast in the 1 to 2 o'clock position there is a 4 cm irregular mass. Subtle pink erythema without pitting. Left axilla with a palpable 2 cm lymph node.     12/03/2017 Biopsy    Left breast USG core biopsy: IDC, G3, ER+(5%), PR-(0), HER2-(0)  Left axillary USG core biopsy: fragments of LN negative for carcinoma     12/10/2017 Initial Diagnosis    Malignant neoplasm of upper-outer quadrant of left breast in female, estrogen receptor negative (CMS-HCC)     12/10/2017 Tumor Board    MDC recs: Left breast cT2 N0 IDC, G3, +/-/-. Very suspicious LN on imaging and exam. LN core biopsy negative. Discussed considering repeat core (although needle is definitively in the node) vs outback SLN. Will receive NACT. If node neg, plan for outback SLN. Node positivity would affect radiation recs if patient has mastectomy. Meeting genetics today. Will discuss fertility. Also discussed concerns for XRT in young patient in 20's - higher risk of complications from XRT long-term. Consider staging studies.     12/10/2017 -  Cancer Staged    Staging form: Breast, AJCC 8th Edition  - Clinical stage from 12/10/2017: Stage IIB (cT2, cN0(f), cM0, G3, ER+, PR-, HER2-) - Signed by Talbert Cage, DO on 12/10/2017       12/11/2017 Biopsy    Repeat left axillary core biopsy given clinical suspicion: LN negative for carcinoma     12/24/2017 - 02/17/2018 Chemotherapy    OP BREAST AC (DOSE DENSE)  DOXOrubicin 60 mg/m2, Cyclophosphamide 600 mg/m2 every 14 days     01/08/2018 Genetics    Patient has genetic testing done for breast cancer.  Negative [employer] Breast Cancer Guidelines-Based Panel.     02/25/2018 -  Chemotherapy    OP BREAST PACLItaxel WEEKLY / CARBOplatin EVERY 3 WEEKS  PACLItaxel 80 mg/m2 IV weekly   CARBOplatin AUC 6 IV every 3 weeks   21-day cycle x 4 cycles      05/30/2018 Surgery    Left SSM: Residual IDC, Gr 2 +/-/- measuring 8mm.  Final margins are clear.  0/4 SLN with metastatic disease  Residual Cancer Burden   Tumor bed size: 8 mm x 7 mm  Overall tumor cellularity: 40%  Percentage in situ: 0%  Number of involved lymph nodes: 0  Diameter of largest metastasis: 0 mm  Residual Cancer Burden Score: 1.687  Residual Cancer Burden Class: RCB-II     06/09/2018 -  Cancer Staged    Staging form: Breast, AJCC 8th Edition  - Pathologic stage from 06/09/2018: No Stage Recommended (ypT1b, pN0(sn), cM0, G2, ER+, PR-, HER2-) - Signed by Rudie Meyer, ANP on 06/09/2018       10/23/2018 Surgery    Left chest wall excision by Dr. Dellis Anes. Final Path: IDC Grade 3 ER-PR- HER2- measuring 1 cm in greatest dimension. Final Margins clear.      11/12/2018 -  Cancer Staged    Staging form: Breast, AJCC 8th Edition  - Pathologic stage from 11/12/2018: Stage IB (rpT1b, pN0, cM0, G3, ER-, PR-, HER2-) - Signed by Talbert Cage, DO on 11/13/2018         Past Medical History:   Diagnosis Date   ??? Anxiety    ??? Depression    ??? Malignant neoplasm of upper-outer quadrant of left breast in female, estrogen receptor negative (CMS-HCC) 12/10/2017   ???  Panic disorder    ??? PTSD (post-traumatic stress disorder)      Past Surgical History:   Procedure Laterality Date   ??? AUGMENTATION MAMMAPLASTY Left 08/2018    left only    ??? BREAST BIOPSY Left 2019    malignant   ??? CHEMOTHERAPY Left 11/2017    May 13 2018   ??? IR INSERT PORT AGE GREATER THAN 5 YRS  12/18/2017    IR INSERT PORT AGE GREATER THAN 5 YRS 12/18/2017 [name], MD IMG VIR [facility]   ??? MASTECTOMY Left 05/2018   ??? PR BREAST RECONSTRUC W TISS EXPANDR Left 05/30/2018    Procedure: BREAST RECON IMMED/DELAY W/EXPANDR W/SUBSQT EXPA;  Surgeon: Arsenio Katz, MD;  Location: ASC OR [facility];  Service: Plastics   ??? PR BX/REMV,LYMPH NODE,DEEP AXILL Left 05/30/2018    Procedure: BX/EXC LYMPH NODE; OPEN, DEEP AXILRY NODE;  Surgeon: Talbert Cage, DO;  Location: ASC OR [facility];  Service: Surgical Oncology Breast   ??? PR EXC TUMOR SOFT TISSUE NECK/ANT THORAX SUBQ 3+CM Right 10/23/2018    Procedure: Priority EXCISION, TUMOR, SOFT TISSUE OF NECK OR ANTERIOR THORAX, SUBCUTANEOUS; 3 CM OR GREATER;  Surgeon: Talbert Cage, DO;  Location: ASC [facility];  Service: Surgical Oncology Breast   ??? PR IMPLNT BIO IMPLNT FOR SOFT TISSUE REINFORCEMENT Left 05/30/2018    Procedure: IMPLANTATION BIOLOGIC IMPLANT(EG, ACELLULAR DERMAL MATRIX) FOR SOFT TISSUE REINFORCEMENT(EG, BREAST, TRUNK);  Surgeon: Arsenio Katz, MD;  Location: ASC OR [facility];  Service: Plastics   ??? PR INSERT BREAST PROS IMMED AFTER EXCIS Left 05/30/2018    Procedure: IMMED INSRT BREAST PROSTH AFTER MASTOPEX/MASTECT;  Surgeon: Arsenio Katz, MD;  Location: ASC OR [facility];  Service: Plastics   ??? PR INTRAOPERATIVE SENTINEL LYMPH NODE ID W DYE INJECTION Left 05/30/2018    Procedure: INTRAOPERATIVE IDENTIFICATION SENTINEL LYMPH NODE(S) INCLUDE INJECTION NON-RADIOACTIVE DYE, WHEN PERFORMED;  Surgeon: Talbert Cage, DO;  Location: ASC OR [facility];  Service: Surgical Oncology Breast   ??? PR MASTECTOMY, SIMPLE, COMPLETE Left 05/30/2018    Procedure: Urgent MASTECTOMY, SIMPLE, COMPLETE;  Surgeon: Talbert Cage, DO;  Location: ASC OR [facility];  Service: Surgical Oncology Breast   ??? PR RECMPL WND TRUNK 2.6-7.5 CM Left 10/23/2018    Procedure: REPR COMPLX TRUNK; 2.6 CM TO 7.5 CM;  Surgeon: Arsenio Katz, MD;  Location: ASC OR [facility];  Service: Plastics   ??? PR REPLACE TISSUE EXPANDER Left 09/24/2018    Procedure: REPLACEMENT OF TISSUE EXPANDER WITH PERMANENT PROSTHESIS;  Surgeon: Arsenio Katz, MD;  Location: MAIN OR [facility];  Service: Plastics   ??? PR REVISE BREAST RECONSTRUCTION Left 09/24/2018    Procedure: R21 - REVISION OF RECONSTRUCTED  BREAST;  Surgeon: Arsenio Katz, MD;  Location: MAIN OR [facility];  Service: Plastics   ??? PR SUSPENSION OF BREAST Right 09/24/2018    Procedure: MASTOPEXY FOR SYMMETRY PURPOSES;  Surgeon: Arsenio Katz, MD;  Location: MAIN OR [facility];  Service: Plastics     Family History   Problem Relation Age of Onset   ??? Skin cancer Mother    ??? Breast cancer Neg Hx    ??? Colon cancer Neg Hx    ??? Endometrial cancer Neg Hx    ??? Ovarian cancer Neg Hx      Social History     Socioeconomic History   ??? Marital status: Single     Spouse name: Not on file   ??? Number of children: Not on file   ???  Years of education: Not on file   ??? Highest education level: Not on file   Occupational History   ??? Not on file   Social Needs   ??? Financial resource strain: Not on file   ??? Food insecurity     Worry: Not on file     Inability: Not on file   ??? Transportation needs     Medical: Not on file     Non-medical: Not on file   Tobacco Use   ??? Smoking status: Never Smoker   ??? Smokeless tobacco: Never Used   Substance and Sexual Activity   ??? Alcohol use: Not Currently   ??? Drug use: Never   ??? Sexual activity: Not on file   Lifestyle   ??? Physical activity     Days per week: Not on file     Minutes per session: Not on file   ??? Stress: Not on file   Relationships   ??? Social Wellsite geologist on phone: Not on file     Gets together: Not on file     Attends religious service: Not on file     Active member of club or organization: Not on file     Attends meetings of clubs or organizations: Not on file     Relationship status: Not on file   Other Topics Concern   ??? Not on file   Social History Narrative    The patient is single.  She lives at home with her mother and brother       Current Outpatient Medications:   ???  acetaminophen (TYLENOL) 325 MG tablet, Take 325 mg by mouth every six (6) hours as needed for pain., Disp: , Rfl:   ???  ARIPiprazole (ABILIFY) 2 MG tablet, Take 1 tablet (2 mg total) by mouth daily., Disp: 30 tablet, Rfl: 1  ???  capecitabine (XELODA) 500 MG tablet, Take 3 tablets (1,500 mg total) by mouth Two (2) times a day . 14 days on then 7 days off , then repeat., Disp: 84 tablet, Rfl: 8  ???  ibuprofen (MOTRIN) 800 MG tablet, Take 1 tablet (800 mg total) by mouth every eight (8) hours as needed for pain., Disp: 70 tablet, Rfl: 2  ???  LORazepam (ATIVAN) 1 MG tablet, Take 1 tablet (1 mg total) by mouth every eight (8) hours as needed for anxiety., Disp: 30 tablet, Rfl: 0  ???  pantoprazole (PROTONIX) 40 MG tablet, Take 1 tablet (40 mg total) by mouth daily., Disp: 30 tablet, Rfl: 3  ???  sertraline (ZOLOFT) 100 MG tablet, Take 1 and 1/2 tablets (150 mg total) by mouth daily. (Patient taking differently: Take 200 mg by mouth daily. ), Disp: 45 tablet, Rfl: 1  ???  loperamide (IMODIUM) 2 mg capsule, Take 2 mg by mouth 4 (four) times a day as needed., Disp: , Rfl:   Allergies   Allergen Reactions   ??? Amoxicillin Other (See Comments)     EDEMA OF FACE       Reproductive History:  Nulliparous. Menarche reported at age 25. LMP No LMP recorded. OCPs no. HRT no.    Physical Examination:   Blood pressure 143/67, pulse 82, temperature 36 ??C (96.8 ??F), temperature source Temporal, resp. rate 16, height 167.6 cm (5' 6), weight (!) 137.1 kg (302 lb 4.8 oz), SpO2 98 %, not currently breastfeeding.  General Appearance:  No acute distress, well appearing and well nourished.   Head:  Normocephalic, atraumatic.  Eyes:  Conjuctiva and lids appear normal. Pupils equal and round,   sclera anicteric.   Ears:  Overall appearance normal with no scars, lesions or               masses.  Hearing is grossly normal.   Nose: Not evaluated today. Patient was wearing a mask during the visit due to COVID19 recommendations.   Throat: Not evaluated today. Patient was wearing a mask during the visit due to COVID19 recommendations.   Neck: Supple, symmetrical, trachea midline, no adenopathy;   thyroid: no enlargement/tenderness/nodules.   Pulmonary:    Normal respiratory effort. Lungs were clear to auscultation  bilaterally.   Cardiovascular:  Regular rate and rhythm, no murmur noted. No thrill noted.   Abdomen:   Soft, non-tender, without masses. No                                      hepatosplenomegaly. No hernias appreciated. Normoactive bowel sounds.   Musculoskeletal: Normal gait. Extremities without clubbing, cyanosis, or           edema.   Skin: Skin color, texture, turgor normal, no rashes or lesions.   Neurologic: No motor abnormalities noted. Sensation grossly intact.   Lymphatic: Breast: No cervical or supraclavicular LAD noted.   A comprehensive examination of the breasts and chest wall was performed with the patient upright and supine with arms at her sides and above her head.   Left breast well healed scar at the left breast inframmamary fold. Nipple everted. normal in size, normal symmetry, normal contour, no dimpling, no skin changes, nipple everted, nipple exhibits no crusting or excoriation, no masses or nodules, no palpable axillary adenopathy. infraclavicular and supraclavicular nodal examinations are negative. Cosmetic outcome is good.  Right breast normal in size, normal symmetry, normal contour, no dimpling, no skin changes, nipple everted, nipple exhibits no crusting or excoriation, no masses or nodules, no palpable axillary adenopathy.   Psychiatric: Judgement and insight appropriate. Oriented to person,         place, and time.       Imaging Review:  No new imaging    Impression:  Ms. Jasper is a 23 y.o. female with a history of left breast infiltrating ductal carcinoma, estrogen receptor negative, progesterone receptor negative and HER2 negative. See treatment summary above.   Staging  Cancer Staging  Malignant neoplasm of upper-outer quadrant of left breast in female, estrogen receptor negative (CMS-HCC)  Staging form: Breast, AJCC 8th Edition  - Clinical stage from 12/10/2017: Stage IIB (cT2, cN0(f), cM0, G3, ER+, PR-, HER2-) - Signed by Talbert Cage, DO on 12/10/2017  - Pathologic stage from 06/09/2018: No Stage Recommended (ypT1b, pN0(sn), cM0, G2, ER+, PR-, HER2-) - Signed by Rudie Meyer, ANP on 06/09/2018  - Pathologic stage from 11/12/2018: Stage IB (rpT1b, pN0, cM0, G3, ER-, PR-, HER2-) - Signed by Talbert Cage, DO on 11/13/2018    There is no evidence of disease at 2 months.    Recommendations:  1. Continue current therapy as prescribed by medical oncology.  2. Continue surveillance as outlined. NCCN recommendations include having a primary care provider for all general health care, including cancer screening tests. Visits with medical oncology should occur every 3 to 6 months for the first 3 years, every 6 to 12 months for years 4 and 5, and annually thereafter. Visits with surgical oncology and radiation oncology  should be rotated with medical oncology initially until the patient is released to medical oncology or a survivorship clinic. The surgery visit should typically be paired with the imaging appointment so that we can review mammogram findings and recommendations.  3. Return in one year with diagnostic mammography.   All of the patient's and family's questions and concerns were addressed during the visit and they are in agreement with the plan of care. Please do not hesitate to contact me with questions or concerns.    Cancer Staging  Malignant neoplasm of upper-outer quadrant of left breast in female, estrogen receptor negative (CMS-HCC)  Staging form: Breast, AJCC 8th Edition  - Clinical stage from 12/10/2017: Stage IIB (cT2, cN0(f), cM0, G3, ER+, PR-, HER2-) - Signed by Talbert Cage, DO on 12/10/2017  - Pathologic stage from 06/09/2018: No Stage Recommended (ypT1b, pN0(sn), cM0, G2, ER+, PR-, HER2-) - Signed by Rudie Meyer, ANP on 06/09/2018  Staging comments: Residual Cancer Burden   Tumor bed size: 8 mm x 7 mm  Overall tumor cellularity: 40%  Percentage in situ: 0%  Number of involved lymph nodes: 0  Diameter of largest metastasis: 0 mm  Residual Cancer Burden Score: 1.687  Residual Cancer Burden Class: RCB-II  - Pathologic stage from 11/12/2018: Stage IB (rpT1b, pN0, cM0, G3, ER-, PR-, HER2-) - Signed by Talbert Cage, DO on 11/13/2018    Scribe's Attestation: [name], DO obtained and performed the history, physical exam and medical decision making elements that were entered into the chart.  Signed by Gretchen Short, Scribe, on December 15, 2018 10:47 AM ----------------------------------------------------------------------------------------------------------------------  December 23, 2018 7:49 PM. Documentation assistance provided by the Scribe. I was present during the time the encounter was recorded. The information recorded by the Scribe was done at my direction and has been reviewed and validated by me.  ----------------------------------------------------------------------------------------------------------------------       Royann Shivers, DO  Surgical Breast Oncology and Oncoplastics  12/15/2018  10:50 AM

## 2018-12-12 ENCOUNTER — Ambulatory Visit: Admit: 2018-12-12 | Discharge: 2018-12-13

## 2018-12-12 NOTE — Unmapped (Signed)
SiteLast TxDose  Rx:Lt SCV/Axi: 12/12/2018: 200/5,000 cGy  Rx:Lt Breast R: 12/12/2018: 200/5,000 cGy

## 2018-12-12 NOTE — Unmapped (Signed)
Radiation Treatment Management Note    VISIT DATE: 12/12/2018    Diagnosis:  23 y.o.??female??with a history of cT2N0 IDC of the left breast, grade 3, weakly ER+, PR-/HER2- with suspicious lymph nodes on imaging??but core??biospy negative s/p neoadjuvant ddAC-->taxol/carboplatin??followed by left skin sparing??mastectomy + reconstruction with SLNB in 05/2018 showing ypT1bN0 disease, no LVI. She then started adjuvant xeloda and was found to have recurrent / persistent chest wall disease at the time of implant exchange??at the biopsy site, s/p excision??10/23/18??showing??IDC measuring 1cm, grade 3, ER- PR- HER2-, negative margins (closest <33mm at posterior margin)??Marland Kitchen She is now receiving PMRT in the setting of locally recurrent triple negative disease??of the left chest wall.??    CURRENT TREATMENT:   Cumulative dose: 200cGy / 5000cGy (followed by 1000cGy boost)    Subjective:   She had her first treatment and is doing well with no new issues or complaints aside from anxiety about her future. She is using lotion for her skin in the treatment area. She came today with her boyfriend, Sheri [name], who she is now living with.     PHYSICAL EXAMINATION:  BP 141/80  - Pulse 84  - Temp 36.4 ??C (97.5 ??F) (Oral)  - Resp 17  - Wt (!) 137.5 kg (303 lb 3.2 oz)  - SpO2 98%  - BMI 48.94 kg/m??   KPS: 90  GENERAL:  Reveals a well-developed, well-nourished, person in no apparent distress who appears their stated age.   Breast: The incision in the left reconstructed breast is well healed without nodularity  Skin: Examination of the skin within the treatment field reveals no erythema without desquamation.  Lymph: No palpable lymphadenopathy    ASSESSMENT AND PLAN:     1. Breast Cancer  We will continue on with the course of treatment as planned.  I have reviewed and approved the appropriate quality assurance and targeting imaging.      2. Skin Care  Continue lotion nightly.    3. Follow Up: We will reassess the patient next week. Reassured her regarding anxiety about the future - she knows to call me with any questions or concerns, or if there is anything I can do to help alleviate her fears in the meantime.

## 2018-12-15 ENCOUNTER — Ambulatory Visit: Admit: 2018-12-15 | Discharge: 2018-12-16 | Payer: MEDICAID

## 2018-12-15 ENCOUNTER — Ambulatory Visit: Admit: 2018-12-15 | Discharge: 2018-12-16

## 2018-12-15 NOTE — Unmapped (Signed)
It was a pleasure to see you today in the Breast Surgical Oncology Clinic. Please call my nurse navigator, Lindwood Qua, if you have any interval questions or concerns.     1. Follow up in 1 year with imaging.  2. You will continue to follow up with Dr. Baird Lyons for completion of radiation treatment.   3. Continue surveillance as outlined. NCCN recommends that you continue to see your primary care provider for all general health care recommended for a patient your age, including cancer screening tests.  Any symptoms should be brought to the attention of your provider.  You should see your medical oncologist every 3 to 6 months for the first 3 years, every 6 to 12 months for years 4 and 5, and annually thereafter. Visits with your surgeon and radiation oncologist should be rotated with your medical oncology team initially until you are released to medical oncology or a survivorship clinic. The surgery visit should typically be paired with the imaging appointment so that we can review mammogram findings and recommendations.         Surgical oncology contact information:  For appointments, please call [phone].     Nurse Navigator available Monday through Friday 8:30 am to 4:30 pm:   Lindwood Qua, RN:    Phone: [phone]   Fax: [phone] attention Vivia Budge    For emergencies, evenings or weekends, please call [phone] and ask for the surgical oncology resident on call. Please be aware that this person is also responding to in-hospital emergencies and patient issues and may not answer your phone call immediately, but will return your call as soon as possible.

## 2018-12-16 ENCOUNTER — Ambulatory Visit: Admit: 2018-12-16 | Discharge: 2018-12-17

## 2018-12-16 DIAGNOSIS — C50412 Malignant neoplasm of upper-outer quadrant of left female breast: Principal | ICD-10-CM

## 2018-12-16 DIAGNOSIS — Z171 Estrogen receptor negative status [ER-]: Principal | ICD-10-CM

## 2018-12-16 DIAGNOSIS — K209 Esophagitis, unspecified: Principal | ICD-10-CM

## 2018-12-16 MED ORDER — PANTOPRAZOLE 40 MG TABLET,DELAYED RELEASE: 40 mg | tablet | Freq: Every day | 3 refills | 30 days | Status: AC

## 2018-12-17 ENCOUNTER — Ambulatory Visit: Admit: 2018-12-17 | Discharge: 2018-12-18

## 2018-12-18 ENCOUNTER — Ambulatory Visit: Admit: 2018-12-18 | Discharge: 2018-12-19

## 2018-12-19 NOTE — Unmapped (Addendum)
Outpatient SW Note    SW received referral, re: Pt missed two appointments in radiation and treatment team concerned about barriers to care.     SW contacted patient who explained that she missed her appointments because she had to rely on others for transportation due to lack of funds for gas. Pt requesting assistance with gas. SW asked if patient had other current needs, pt denied other needs at this time.     SW contacted Cindie Crumbly, left voice message. Pt is on the gas card list but needs reinstatement as her eligibility expired in May 2020. Pt also need Medicaid evaluation. SW to refer case to [facility] worker.     SW contacted [employer], who agreed to re-enroll patient in [facility] CPA[employer] gas card program. Pt will be able pick up gas cards on 12/23/2018.     SW contacted patient and provided details of gas card program enrollment and how patient could access gas cards at her next appointment. SW available for further consult as needed. SW to monitor.

## 2018-12-22 ENCOUNTER — Ambulatory Visit: Admit: 2018-12-22 | Discharge: 2018-12-23

## 2018-12-23 ENCOUNTER — Ambulatory Visit: Admit: 2018-12-23 | Discharge: 2018-12-24

## 2018-12-23 NOTE — Unmapped (Signed)
Rec'd message that Dr. Steva Ready was attempting to reach our office regarding patient's disability claim. Left message with my pager number and email address.  Will also attempt to call back tomorrow.

## 2018-12-24 ENCOUNTER — Ambulatory Visit: Admit: 2018-12-24 | Discharge: 2018-12-25

## 2018-12-24 NOTE — Unmapped (Signed)
Again returned call to Dr. Steva Ready.  He inquired if the chest wall cancer recently excised was a reminent from prior cancer or a recurrence. D/w pt that per clinic note dated 11/12/2018 Upon TE exchange to implant (7/29), a suspicious skin lesion was noticed and biopsied w/ pathology showing disease recurrence necessisating chest wall excision (08/27)    He also inquires about chest wall involvement.  I reviewed from her pathology report dated 10/23/2018   Skin Invasion  Invasive carcinoma directly invades into the dermis or epidermis without skin ulceration (this does not change the T classification)      But that there was no notation of pectoralis involvement.    He denies any additional questions or concerns at this time.

## 2018-12-25 ENCOUNTER — Ambulatory Visit: Admit: 2018-12-25 | Discharge: 2018-12-26

## 2018-12-26 ENCOUNTER — Ambulatory Visit: Admit: 2018-12-26 | Discharge: 2018-12-27

## 2018-12-26 NOTE — Unmapped (Signed)
Radiation Treatment Management Note    VISIT DATE: 12/26/2018    Diagnosis:  23 y.o.??female??with a history of cT2N0 IDC of the left breast, grade 3, weakly ER+, PR-/HER2- with suspicious lymph nodes on imaging??but core??biospy negative s/p neoadjuvant ddAC-->taxol/carboplatin??followed by left skin sparing??mastectomy + reconstruction with SLNB in 05/2018 showing ypT1bN0 disease, no LVI. 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    CURRENT TREATMENT:   Cumulative dose: 2000cGy / 5000cGy (followed by 1000cGy boost)    Subjective:   She continues to do well, without any issues. Slight hyperpigmentation of left breast with some heaviness. She continues to use lotion for her skin in the treatment area.     PHYSICAL EXAMINATION:  KPS: 90  GENERAL:  Reveals a well-developed, well-nourished, person in no apparent distress who appears their stated age.   Breast: The incision in the left reconstructed breast is well healed without nodularity  Skin: Examination of the skin within the treatment field reveals very mild erythema without desquamation.  Lymph: No palpable lymphadenopathy    ASSESSMENT AND PLAN:     1. Breast Cancer  We will continue on with the course of treatment as planned.  I have reviewed and approved the appropriate quality assurance and targeting imaging.      2. Skin Care  Continue lotion nightly.    3. Follow Up:  Reassess next week on Friday. She knows to call earlier if any problems in the interim.

## 2018-12-26 NOTE — Unmapped (Signed)
SiteLast TxDose  Rx:Lt SCV/Axi: 12/26/2018: 2,000/5,000 cGy  Rx:Lt Breast R: 12/26/2018: 2,000/5,000 cGy    Wt Readings from Last 6 Encounters:   12/26/18 (!) 138.7 kg (305 lb 11.2 oz)   12/15/18 (!) 137.1 kg (302 lb 4.8 oz)   12/12/18 (!) 137.5 kg (303 lb 3.2 oz)   11/21/18 (!) (P) 136.5 kg (301 lb)   11/13/18 (!) 136.5 kg (301 lb)   11/12/18 (!) 136.9 kg (301 lb 14.4 oz)   12/26/2018    Dose Site Summary:    Subjective/Assessment/Recommendations:    1. Skin: No problems and Intact  2. Lymphedema: Not present  3. Nutrition: No issues, Eating well and Weight stable  4. Fatigue: Mild  5. Pain: Denies pain  6. Elimination: No issues  7. Prescription Needs: None  8. Psychosocial: Has home support  9. Other: multiple falls this week

## 2018-12-29 ENCOUNTER — Ambulatory Visit: Admit: 2018-12-29 | Discharge: 2019-01-26 | Attending: Radiation Oncology | Primary: Radiation Oncology

## 2018-12-29 ENCOUNTER — Ambulatory Visit: Admit: 2018-12-29 | Discharge: 2019-01-26

## 2018-12-31 DIAGNOSIS — F411 Generalized anxiety disorder: Principal | ICD-10-CM

## 2019-01-01 MED ORDER — LORAZEPAM 1 MG TABLET
ORAL_TABLET | Freq: Three times a day (TID) | ORAL | 0 refills | 10.00000 days | Status: CP | PRN
Start: 2019-01-01 — End: ?
  Filled 2019-01-09: qty 30, 10d supply, fill #0

## 2019-01-06 NOTE — Unmapped (Unsigned)
Versa 1 and myself have attempted multiple times to call Helga Asbury (every day since Tuesday 11/3 when she did not show for treatment). Unfortunately, she is not picking up and her voicemail is full. I have also tried texting her with our concern and have also reached out to her SW team to assist with reaching her. We will continue to call daily to check on her until she returns for treatment.   ??  ??  ??  Ailey Wessling L. Baird Lyons, MD  Assistant Professor  Department of Radiation Oncology

## 2019-01-06 NOTE — Unmapped (Signed)
Adolescent and Young Adult Cancer Program Visit   Comprehensive Cancer Support Program     MOST AYA VISITS ARE BEING CONDUCTED REMOTELY VIA PHONE OR VIDEO.  THE AYA TEAM IS OFFSITE AND REMAINS AVAILABLE VIRTUALLY Monday - Thursday from 8am - 5pm. Available in-person on Fridays 8am - 5pm.    Service date: January 05, 2019    Encounter type: Virtual -??text message exchange  ??  Clinician:??[name], LCSWA  ??  Patient identifiers:??Sheri [name]??[name]??is a 23 y.o.??with breast cancer and a history of panic disorder and PTSD.??[name]??uses she/her/hers??pronouns.??    VISIT SUMMARY     Notes: Sent text message to Josslynn Mentzer offering to check in for support and offering assistance with any barriers to making radiation appointments. No response received.      I will continue to follow Chrishonda for AYA-appropriate support. Karli has my contact information and has been encouraged to contact me as needed.     01/05/2019     [name], LCSWA  Adolescent and Young Adult Clinical Social Worker  Pager: [phone]  Available by page to conduct virtual care due to COVID-19

## 2019-01-06 NOTE — Unmapped (Signed)
Versa 1 and myself have attempted multiple times to call Sheri [name] (every day since Tuesday 11/3 when she did not show for treatment). Unfortunately, she is not picking up and her voicemail is full. I have also tried texting her with our concern and have also reached out to her SW team to assist with reaching her. We will continue to call daily to check on her until she returns for treatment.   ??  ??  ??  Sheri [name] L. Baird Lyons, MD  Assistant Professor  Department of Radiation Oncology

## 2019-01-07 ENCOUNTER — Ambulatory Visit: Admit: 2019-01-07 | Discharge: 2019-01-08

## 2019-01-08 NOTE — Unmapped (Signed)
Adolescent and Young Adult Cancer Program Visit   Comprehensive Cancer Support Program     MOST AYA VISITS ARE BEING CONDUCTED REMOTELY VIA PHONE OR VIDEO.  THE AYA TEAM IS OFFSITE AND REMAINS AVAILABLE VIRTUALLY Monday - Thursday from 8am - 5pm. Available in-person on Fridays 8am - 5pm.    Service date: January 07, 2019    Encounter type: Virtual - telephone call    Location of patient: Primary Residence Listed in [place]    Clinician:??[name], LCSWA  ??  Patient identifiers:??Sheri [name]??Sheri [name]??is a 23 y.o.??with breast cancer and a history of panic disorder and PTSD.??Sheri [name]??uses she/her/hers??pronouns.??    VISIT SUMMARY     Notes: Called Sheri [name] due to assess for factors contributing to missed radiation appointments and lack of communication with any providers or staff.    She answered and was grateful for the call. She stated that she has been very depressed recently and this has made it difficult for her to get out of bed, answer the phone, or come to her appointments. She endorsed feeling at times that going to radiation or taking care of herself in any way doesn't feel worth it. She denies suicidal ideation or thoughts of self harm. She states that she very much wants to live, and feels overwhelmed by thoughts that her cancer could come back whether or not she completes radiation. She is worried about recurrence as well as cardiac issues or other radiation late effects.    Called after her radiation appointment. We made a plan for me to talk with her once a week to help her stay encouraged and motivated to complete radiation. She also plans to ask for another appointment with [name]. She and I will discuss transferring her psychiatric to an in-person community provider in the future as she finds in person appointments easier given the lack of privacy at home. Plan: Will see in person this Friday after her radiation appointment for support, and will call next week to support continuing with radiation treatment.    I will continue to follow Sheri [name] for AYA-appropriate support. Sheri [name] has my contact information and has been encouraged to contact me as needed.     01/07/2019     [name], LCSWA  Adolescent and Young Adult Clinical Social Worker  Pager: [phone]  Available by page to conduct virtual care due to COVID-19

## 2019-01-09 ENCOUNTER — Ambulatory Visit: Admit: 2019-01-09 | Discharge: 2019-01-10

## 2019-01-09 MED FILL — HYDROCORTISONE 2.5 % TOPICAL CREAM: 20 days supply | Qty: 28 | Fill #0

## 2019-01-09 MED FILL — PANTOPRAZOLE 40 MG TABLET,DELAYED RELEASE: ORAL | 30 days supply | Qty: 30 | Fill #0
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
## 2019-01-09 NOTE — Unmapped (Signed)
01/09/2019    Dose Site Summary:Rx:Lt SCV/Axi: 01/07/2019: 2,400/5,000 cGy  Rx:Lt Breast R: 01/07/2019: 2,267/5,000 cGy  QQ:VZDGLOVFIE: : 0/1,000 cGy  PPI:RJJOACZYSA: 01/07/2019: 2,267 cGy    Subjective/Assessment/Recommendations:    1. Skin: dermatitis noted  2. Lymphedema: Not present  3. Nutrition: Eating well  4. Fatigue: No issues  5. Pain: Denies pain  6. Elimination: No issues  7. Prescription Needs: None  8. Psychosocial: home issues    Wt Readings from Last 3 Encounters:   01/09/19 (!) 139.3 kg (307 lb 3.2 oz)   12/26/18 (!) 138.7 kg (305 lb 11.2 oz)   12/15/18 (!) 137.1 kg (302 lb 4.8 oz)     Wt Readings from Last 3 Encounters:   01/09/19 (!) 139.3 kg (307 lb 3.2 oz)   12/26/18 (!) 138.7 kg (305 lb 11.2 oz)   12/15/18 (!) 137.1 kg (302 lb 4.8 oz)     Temp Readings from Last 3 Encounters:   01/09/19 36.2 ??C (97.2 ??F) (Temporal)   12/26/18 35.8 ??C (96.4 ??F) (Temporal)   12/15/18 36 ??C (96.8 ??F) (Temporal)     BP Readings from Last 3 Encounters:   12/15/18 143/67   12/12/18 141/80   11/13/18 139/73     Pulse Readings from Last 3 Encounters:   12/15/18 82   12/12/18 84   11/13/18 95

## 2019-01-10 NOTE — Unmapped (Signed)
Radiation Treatment Management Note    VISIT DATE: 01/09/2019    Diagnosis:  23 y.o.??female??with a history of cT2N0 IDC of the left breast, grade 3, weakly ER+, PR-/HER2- with suspicious lymph nodes on imaging??but core??biospy negative s/p neoadjuvant ddAC-->taxol/carboplatin??followed by left skin sparing??mastectomy + reconstruction with SLNB in 05/2018 showing ypT1bN0 disease, no LVI. 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    CURRENT TREATMENT:   Cumulative dose: 2400cGy / 5000cGy (followed by 1000cGy boost)    Subjective:   Sheri [name] unfortunately went into a depressive episode last week. She missed treated last Tuesday-Friday but returned again Wednesday. Sheri [name] and I saw her together today to check in and see how we can best help. She reports feeling down and anxious about everything going on both at home and with her breast cancer and has not taken her anti-depression/anxiety medications as of late.     Regarding the radiation, she is tolerating it fairly well, with some mild erythema. She has fatigue secondary to anxiety at night, keeping her from sleeping overnight.     PHYSICAL EXAMINATION:  KPS: 90  GENERAL:  Reveals a well-developed, well-nourished, person in no apparent distress who appears their stated age.   Breast: The incision in the left reconstructed breast is well healed without nodularity  Skin: Examination of the skin within the treatment field reveals grade 1 dermatitis without desquamation.  Lymph: No palpable lymphadenopathy    ASSESSMENT AND PLAN:     1. Breast Cancer We will continue on with the course of treatment as planned.  I have reviewed and approved the appropriate quality assurance and targeting imaging.      2. Skin Care  Will prescribe hydrocortisone 2.5% cream to use daily.     3. Anxiety   Will prescribe ativan PRN for RT treatments. Advised not to drive after taking the ativan.     4. Follow Up:  Sheri [name] to continue to check in with Sheri [name] weekly. I will plan to see her again in status check next Thursday although she has my number and knows to call should any questions or concerns arise earlier.

## 2019-01-12 ENCOUNTER — Ambulatory Visit: Admit: 2019-01-12 | Discharge: 2019-01-13

## 2019-01-16 NOTE — Unmapped (Signed)
Adolescent and Young Adult Cancer Program Visit   Comprehensive Cancer Support Program     MOST AYA VISITS ARE BEING CONDUCTED REMOTELY VIA PHONE OR VIDEO.  THE AYA TEAM IS OFFSITE AND REMAINS AVAILABLE VIRTUALLY Monday - Thursday from 8am - 5pm. Available in-person on Fridays 8am - 5pm.    Service date: January 16, 2019    Encounter type: Virtual - telephone call and text message exchange     Location of patient: Radiation Oncology  ??  Clinician: [name], LCSWA  ??  Patient identifiers:??Sheri??[name]??is a 23 y.o.??with breast cancer and a history of depression, panic disorder, and PTSD.??Sheri [name]??uses she/her/hers??pronouns.??      VISIT SUMMARY     Notes: Called and texted Sheri [name] this Tuesday with no response. Sent text message to Sheri [name] this morning. She stated that she was coming for her appointment, and agree to text me when she was done with treatment so I could come see her in radiation.    Did not hear from her; attempted to locate in radiation. Called 2x with no response; sent text asking her to call me.      Plan: Will attempt to contact next week.    I will continue to follow Sheri [name] for AYA-appropriate support. Sheri [name] has my contact information and has been encouraged to contact me as needed.     01/16/2019     [name], LCSWA  Adolescent and Young Adult Clinical Social Worker  Pager: [phone]  Available by page to conduct virtual care due to COVID-19

## 2019-01-19 ENCOUNTER — Ambulatory Visit: Admit: 2019-01-19 | Discharge: 2019-01-20

## 2019-01-19 NOTE — Unmapped (Signed)
Adolescent and Young Adult Cancer Program Visit   Comprehensive Cancer Support Program     MOST AYA VISITS ARE BEING CONDUCTED REMOTELY VIA PHONE OR VIDEO.  THE AYA TEAM IS OFFSITE AND REMAINS AVAILABLE VIRTUALLY Monday - Thursday from 8am - 5pm. Available in-person on Fridays 8am - 5pm.    Service date: January 19, 2019    Encounter type: Virtual - telephone call   ??  Clinician:??[name], LCSWA  ??  Patient identifiers:??Sheri [name]??Sheri [name]??is a 23 y.o.??with breast cancer and a history of??depression,??panic disorder,??and PTSD.??[name]??uses she/her/hers??pronouns.    VISIT SUMMARY     Notes: Called Rainbow Salman to address barriers to attending radiation treatments. No response. Will attempt again tomorrow.      I will continue to follow Salli for AYA-appropriate support. Eyana has my contact information and has been encouraged to contact me as needed.     01/19/2019     [name], LCSWA  Adolescent and Young Adult Clinical Social Worker  Pager: [phone]  Available by page to conduct virtual care due to COVID-19

## 2019-01-21 ENCOUNTER — Ambulatory Visit: Admit: 2019-01-21 | Discharge: 2019-01-22

## 2019-01-21 MED ORDER — SILVER SULFADIAZINE 1 % TOPICAL CREAM
1 refills | 0 days | Status: CP
Start: 2019-01-21 — End: 2020-01-21

## 2019-01-21 MED ORDER — MOMETASONE 0.1 % TOPICAL CREAM
1 refills | 0 days | Status: CP
Start: 2019-01-21 — End: 2020-01-21

## 2019-01-21 NOTE — Unmapped (Signed)
01/21/2019    Dose Site Summary:Rx:Lt SCV/Axi: 01/19/2019: 3,000/5,000 cGy  Rx:Lt Breast R: 01/19/2019: 2,867/5,000 cGy  ZO:XWRUEAVWUJ: : 0/1,000 cGy  WJX:BJYNWGNFAO: 01/19/2019: 2,867 cGy    Subjective/Assessment/Recommendations:    1. Skin: Intact, Erythematous and Continue emollients  2. Lymphedema: Not present  3. Nutrition: No issues  4. Fatigue: Mild  5. Pain: Mild and Controlled  6. Elimination: No issues  7. Prescription Needs: None  8. Psychosocial: Accompanied by family member/other today    Wt Readings from Last 3 Encounters:   01/09/19 (!) 139.3 kg (307 lb 3.2 oz)   12/26/18 (!) 138.7 kg (305 lb 11.2 oz)   12/15/18 (!) 137.1 kg (302 lb 4.8 oz)     Wt Readings from Last 3 Encounters:   01/09/19 (!) 139.3 kg (307 lb 3.2 oz)   12/26/18 (!) 138.7 kg (305 lb 11.2 oz)   12/15/18 (!) 137.1 kg (302 lb 4.8 oz)     Temp Readings from Last 3 Encounters:   01/09/19 36.2 ??C (97.2 ??F) (Temporal)   12/26/18 35.8 ??C (96.4 ??F) (Temporal)   12/15/18 36 ??C (96.8 ??F) (Temporal)     BP Readings from Last 3 Encounters:   12/15/18 143/67   12/12/18 141/80   11/13/18 139/73     Pulse Readings from Last 3 Encounters:   12/15/18 82   12/12/18 84   11/13/18 95

## 2019-01-22 NOTE — Unmapped (Signed)
Radiation Treatment Management Note    VISIT DATE: 01/21/2019    Diagnosis:  23 y.o.??female??with a history of cT2N0 IDC of the left breast, grade 3, weakly ER+, PR-/HER2- with suspicious lymph nodes on imaging??but core??biospy negative s/p neoadjuvant ddAC-->taxol/carboplatin??followed by left skin sparing??mastectomy + reconstruction with SLNB in 05/2018 showing ypT1bN0 disease, no LVI. 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    CURRENT TREATMENT:   Cumulative dose: 3200cGy / 5000cGy (followed by 1000cGy boost)    Subjective:   Sheri [name] moved in with her mother yesterday and is feeling better about this decision and her ability to take her medications and come in for radiation treatments. She reports some heaviness and redness of the left breast, especially in the inframammary fold where it is beginning to have a small area of desquamation.      PHYSICAL EXAMINATION:  KPS: 90  GENERAL:  Reveals a well-developed, well-nourished, person in no apparent distress who appears their stated age.   Breast: The incision in the left reconstructed breast is well healed without nodularity  Skin: Examination of the skin within the treatment field reveals grade 2 dermatitis with a small area of desquamation in the inframammary fold.  Lymph: No palpable lymphadenopathy    ASSESSMENT AND PLAN:     1. Breast Cancer  We will continue on with the course of treatment as planned.  I have reviewed and approved the appropriate quality assurance and targeting imaging. She plans to come in for treatment more regularly now that her living situation has changed     2. Skin Care Mometasone 2x a day to skin. Will also add silvadene for small areas of moist desquamation in inframammary fold.     3. Anxiety   Continue ativan PRN for RT treatments. Advised not to drive after taking the ativan.     4. Follow Up:  Sheri [name] to continue to check in with [name] weekly along with myself. She knows to text or call me if any concerns arise or if there is anything we can do to help.       Jarome Trull L. Baird Lyons, MD  Assistant Professor  Department of Radiation Oncology

## 2019-01-27 ENCOUNTER — Ambulatory Visit: Admit: 2019-01-27 | Discharge: 2019-02-26 | Attending: Radiation Oncology | Primary: Radiation Oncology

## 2019-01-27 ENCOUNTER — Ambulatory Visit: Admit: 2019-01-27 | Discharge: 2019-02-26

## 2019-01-30 ENCOUNTER — Ambulatory Visit: Admit: 2019-01-30 | Discharge: 2019-01-31

## 2019-01-30 NOTE — Unmapped (Signed)
Adolescent and Young Adult Cancer Program Visit   Comprehensive Cancer Support Program     MOST AYA VISITS ARE BEING CONDUCTED REMOTELY VIA PHONE OR VIDEO.  THE AYA TEAM IS OFFSITE AND REMAINS AVAILABLE VIRTUALLY Monday - Thursday from 8am - 5pm. Available in-person on Fridays 8am - 5pm.    Service date: January 29, 2019    Encounter type: Virtual - telephone call and text message exchange   ??  Clinician:??[name], LCSWA  ??  Patient identifiers:??Sheri [name]??Sheri [name]??is a 23 y.o.??with breast cancer and a history of??depression,??panic disorder,??and PTSD.??Sheri [name]??uses she/her/hers??pronouns.??    VISIT SUMMARY     Notes: Continued attempts to call to provide support with adherence to radiation treatment and for psychosocial concerns. Voicemail full. Followed up today with text informing her I will be at the hospital tomorrow and will try to see her in person.      I will continue to follow Sheri [name] for AYA-appropriate support. Sheri [name] has my contact information and has been encouraged to contact me as needed.     01/29/2019     [name], LCSWA  Adolescent and Young Adult Clinical Social Worker  Pager: [phone]  Available by page to conduct virtual care due to COVID-19

## 2019-01-30 NOTE — Unmapped (Signed)
01/30/2019    Dose Site Summary:Rx:Lt SCV/Axi: 01/30/2019: 3,600/5,000 cGy  Rx:Lt Breast R: 01/30/2019: 3,467/5,000 cGy  ZO:XWRUEAVWUJ: : 0/1,000 cGy  WJX:BJYNWGNFAO: 01/30/2019: 3,467 cGy    Subjective/Assessment/Recommendations:    1. Skin: No problems and Intact  2. Lymphedema: Not present  3. Nutrition: No issues and Eating well  4. Fatigue: Mild  5. Pain: Denies pain  6. Elimination: No issues  7. Prescription Needs: None  8. Psychosocial: Has home support    Wt Readings from Last 3 Encounters:   01/21/19 (!) 139.3 kg (307 lb 1.6 oz)   01/09/19 (!) 139.3 kg (307 lb 3.2 oz)   12/26/18 (!) 138.7 kg (305 lb 11.2 oz)     Wt Readings from Last 3 Encounters:   01/21/19 (!) 139.3 kg (307 lb 1.6 oz)   01/09/19 (!) 139.3 kg (307 lb 3.2 oz)   12/26/18 (!) 138.7 kg (305 lb 11.2 oz)     Temp Readings from Last 3 Encounters:   01/21/19 36.4 ??C (97.5 ??F) (Temporal)   01/09/19 36.2 ??C (97.2 ??F) (Temporal)   12/26/18 35.8 ??C (96.4 ??F) (Temporal)     BP Readings from Last 3 Encounters:   12/15/18 143/67   12/12/18 141/80   11/13/18 139/73     Pulse Readings from Last 3 Encounters:   12/15/18 82   12/12/18 84   11/13/18 95

## 2019-01-31 NOTE — Unmapped (Signed)
Radiation Treatment Management Note    VISIT DATE: 01/30/2019    Diagnosis:  23 y.o.??female??with a history of cT2N0 IDC of the left breast, grade 3, weakly ER+, PR-/HER2- with suspicious lymph nodes on imaging??but core??biospy negative s/p neoadjuvant ddAC-->taxol/carboplatin??followed by left skin sparing??mastectomy + reconstruction with SLNB in 05/2018 showing ypT1bN0 disease, no LVI. 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    CURRENT TREATMENT:   Cumulative dose: 3467cGy / 5000cGy (followed by 1000cGy boost)    Subjective:   Sheri [name] unfortunately has been anxious about coming into the radiation center alone on days where her mother or Sheri [name] (her boyfriend) cannot take her, causing her to miss treatments again this week. She is optimistic about coming in more for treatments if Sheri [name] can come with her. She reports continued erythema and heaviness of the left breast, although not much pain.    PHYSICAL EXAMINATION:  KPS: 90  GENERAL:  Reveals a well-developed, well-nourished, person in no apparent distress who appears their stated age.   Breast: The incision in the left reconstructed breast is well healed without nodularity  Skin: Examination of the skin within the treatment field reveals grade 2 dermatitis with a continued small area of dry desquamation in the inframammary fold.  Lymph: No palpable lymphadenopathy    ASSESSMENT AND PLAN:     1. Breast Cancer We will continue on with the course of treatment as planned.  I have reviewed and approved the appropriate quality assurance and targeting imaging. I talked to Sheri [name] today and he will make sure to come in for her last ~12 treatments. We discussed that it is normal to feel anxious about these radiation treatments, especially given all she is going through.      2. Skin Care   Continue mometasone 2x a day to skin and silvadene as needed for small areas of moist desquamation in inframammary fold.     3. Anxiety   Continue ativan PRN for RT treatments. Advised not to drive after taking the ativan.     4. Follow Up:  Sheri [name] has continued to check in with Sheri [name] weekly along with myself. She knows to text or call me if any concerns arise or if there is anything we can do to help.       Sheri [name] L. Baird Lyons, MD  Assistant Professor  Department of Radiation Oncology

## 2019-01-31 NOTE — Unmapped (Signed)
Adolescent and Young Adult Cancer Program Visit   Comprehensive Cancer Support Program     MOST AYA VISITS ARE BEING CONDUCTED REMOTELY VIA PHONE OR VIDEO.  THE AYA TEAM IS OFFSITE AND REMAINS AVAILABLE VIRTUALLY Monday - Thursday from 8am - 5pm. Available in-person on Fridays 8am - 5pm.    Service date: January 30, 2019    Encounter type: In Person  ??  Location of patient: Radiation Oncology  ??  Clinician: [name], LCSWA  ??  Patient identifiers:??Sheri??[name]??is a 23 y.o.??with breast cancer and a history of depression, panic disorder, and PTSD.??Sheri [name]??uses she/her/hers??pronouns.??    VISIT SUMMARY     Notes: Met with Sheri [name] prior to and during her visit with Dr. Baird Lyons today. She was very pleasant, open, and engaged in the visit.      Brief Summary of Issues:   ?? Reports that she has been consistently taking her psychiatric medication for the past week or two. She feels like it has already started helping her feel a bit better, and understands that it will take time to receive full benefit. She states that her boyfriend Sheri [name] now understands why these medications are important, and has been helping her remember to take them.  ?? Stayed with her mom briefly, but is back at Sheri [name] now due to stress of living with her mom.  ?? Her sleep schedule has been a major barrier to attending appointments. Sheri [name] and all of her friends stay up most of the night and sleep all day. Understandably, it has been hard for her to maintain the opposite schedule, though she feels very bad about this. I validated how difficult this is to be young but have to live as if she is much older, and helped frame this as a time-limited change that she can continue to try to make. ?? She shared that she has come all the way to Sheri [name] several days this week (and in past weeks), and has been so anxious and fearful of radiation that she sat in her car outside and then turned around and went home without coming inside. She shared that chemotherapy feels comforting compared to radiation due to differences in environment, and that she has much more fear with radiation about how it is affecting her body. We discussed how difficult and even traumatic radiation has been for many other young female patients in particular. She was very relieved to learn that it is not her fault for feeling this way and that she is not alone.  ?? She has been able to come to appointments every time someone has come with her, and acknowledges that this feels like the key to successful attendance at appointments. Her mom is unable to come often due to work schedule, but Sheri [name] is able. We discussed ways to ask for his support of her treatment by accompanying her to appointments. Dr. Baird Lyons plans to call him with Sheri [name] permission to encourage this.    Plan: Will call her next week for continued support.    I will continue to follow Sheri [name] for AYA-appropriate support. Macie has my contact information and has been encouraged to contact me as needed.     01/30/2019     [name], LCSWA  Adolescent and Young Adult Clinical Social Worker  Pager: [phone]  Available by page to conduct virtual care due to COVID-19

## 2019-02-02 ENCOUNTER — Ambulatory Visit: Admit: 2019-02-02 | Discharge: 2019-02-03

## 2019-02-03 ENCOUNTER — Ambulatory Visit: Admit: 2019-02-03 | Discharge: 2019-02-04

## 2019-02-03 NOTE — Unmapped (Signed)
Briefly saw Sheri [name] today to touch base, but no exam or treatment planning was done as she is currently still undergoing radiation therapy (tentative completion date 02/17/19). Overall, she is doing well.  We'll plan to see her in early January with labs for follow-up to plan to resume capecitabine.  She is agreeable with this plan.   She understands to call us with questions/concerns in interim.     [name], NP, AOCNP  [facility]  Breast Medical Oncology   [phone]

## 2019-02-04 ENCOUNTER — Ambulatory Visit: Admit: 2019-02-04 | Discharge: 2019-02-05

## 2019-02-04 DIAGNOSIS — C50412 Malignant neoplasm of upper-outer quadrant of left female breast: Principal | ICD-10-CM

## 2019-02-04 DIAGNOSIS — K209 Esophagitis, unspecified: Principal | ICD-10-CM

## 2019-02-04 DIAGNOSIS — F411 Generalized anxiety disorder: Principal | ICD-10-CM

## 2019-02-04 DIAGNOSIS — Z171 Estrogen receptor negative status [ER-]: Principal | ICD-10-CM

## 2019-02-04 MED ORDER — SERTRALINE 100 MG TABLET
ORAL_TABLET | Freq: Every day | ORAL | 0 refills | 30.00000 days | Status: CP
Start: 2019-02-04 — End: 2019-03-06
  Filled 2019-02-04: qty 45, 30d supply, fill #0

## 2019-02-04 MED ORDER — PANTOPRAZOLE 40 MG TABLET,DELAYED RELEASE
ORAL_TABLET | Freq: Every day | ORAL | 0 refills | 30.00000 days | Status: CP
Start: 2019-02-04 — End: 2019-03-06
  Filled 2019-02-06: qty 30, 30d supply, fill #0

## 2019-02-04 MED ORDER — LORAZEPAM 1 MG TABLET
ORAL_TABLET | Freq: Three times a day (TID) | ORAL | 0 refills | 10.00000 days | Status: CP | PRN
Start: 2019-02-04 — End: 2019-02-14
  Filled 2019-02-06: qty 30, 10d supply, fill #0

## 2019-02-04 MED FILL — SERTRALINE 100 MG TABLET: 30 days supply | Qty: 45 | Fill #0 | Status: AC

## 2019-02-04 NOTE — Unmapped (Signed)
Radiation Treatment Management Note    VISIT DATE: 02/04/2019    Diagnosis:  23 y.o.??female??with a history of cT2N0 IDC of the left breast, grade 3, weakly ER+, PR-/HER2- with suspicious lymph nodes on imaging??but core??biospy negative s/p neoadjuvant ddAC-->taxol/carboplatin??followed by left skin sparing??mastectomy + reconstruction with SLNB in 05/2018 showing ypT1bN0 disease, no LVI. 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    CURRENT TREATMENT:   Cumulative dose: 4200cGy / 5000cGy (followed by 1000cGy boost)    Subjective:   Sheri [name] has been coming to treatment this week with Sheri [name]. The ativan helps prior to treatment as well. She has worsening erythema over the left breast but no moist desquamation (just dry desquamation in the inframammary fold). She is taking ibuprofen for pain as needed. She is optimistic about finishing treatment soon.    PHYSICAL EXAMINATION:  KPS: 90  GENERAL:  Reveals a well-developed, well-nourished, person in no apparent distress who appears their stated age.   Breast: The incision in the left reconstructed breast is well healed without nodularity  Skin: Examination of the skin within the treatment field reveals grade 2 dermatitis over the left breast and left axilla with a continued area of dry desquamation in the inframammary fold.  Lymph: No palpable lymphadenopathy    ASSESSMENT AND PLAN:     1. Breast Cancer  We will continue on with the course of treatment as planned.  I have reviewed and approved the appropriate quality assurance and targeting imaging.     2. Skin Care Continue mometasone 2x a day to skin and silvadene as needed for small areas of desquamation in inframammary fold. She will call me if she develops any worsening skin changes to discuss her skin regimen.     3. Anxiety   Continue ativan PRN for RT treatments. Advised not to drive after taking the ativan.     4. Follow Up:  Sheri [name] has continued to check in with Sheri [name] weekly along with myself. She knows to text or call me if any concerns arise or if there is anything we can do to help.       Elsy Chiang L. Baird Lyons, MD  Assistant Professor  Department of Radiation Oncology

## 2019-02-04 NOTE — Unmapped (Signed)
SiteLast TxDose  Rx:Lt SCV/Axi: 02/04/2019: 4,200/5,000 cGy  Rx:Lt Breast R: 02/04/2019: 4,067/5,000 cGy  ZO:XWRUEAVWUJ: : 0/1,000 cGy  WJX:BJYNWGNFAO: 02/04/2019: 4,067 cGy    02/04/2019      Subjective/Assessment/Recommendations:    1. Fatigue: No issues  2. Pain: Mild  3. Skin: Intact  4. Lymphedema: Not present  5. Prescription Needs: None  6. Psychosocial: Has home support     Wt Readings from Last 3 Encounters:   02/04/19 (!) 139.5 kg (307 lb 8 oz)   01/30/19 (!) 140.3 kg (309 lb 3.2 oz)   01/21/19 (!) 139.3 kg (307 lb 1.6 oz)

## 2019-02-05 ENCOUNTER — Ambulatory Visit: Admit: 2019-02-05 | Discharge: 2019-02-06

## 2019-02-06 ENCOUNTER — Ambulatory Visit: Admit: 2019-02-06 | Discharge: 2019-02-07

## 2019-02-06 MED FILL — PANTOPRAZOLE 40 MG TABLET,DELAYED RELEASE: 30 days supply | Qty: 30 | Fill #0 | Status: AC

## 2019-02-06 MED FILL — LORAZEPAM 1 MG TABLET: 10 days supply | Qty: 30 | Fill #0 | Status: AC

## 2019-02-09 ENCOUNTER — Ambulatory Visit: Admit: 2019-02-09 | Discharge: 2019-02-10

## 2019-02-10 NOTE — Unmapped (Signed)
Adolescent and Young Adult Cancer Program Visit   Comprehensive Cancer Support Program     MOST AYA VISITS ARE BEING CONDUCTED REMOTELY VIA PHONE OR VIDEO.  THE AYA TEAM IS OFFSITE AND REMAINS AVAILABLE VIRTUALLY Monday - Thursday from 8am - 5pm. Available in-person on Fridays 8am - 5pm.    Service date: February 09, 2019    Encounter type: Virtual -??telephone call and text message exchange??  ??  Clinician:??[name], LCSWA  ??  Patient identifiers:??Sheri [name]??Sheri [name]??is a 23 y.o.??with breast cancer and a history of??depression,??panic disorder,??and PTSD.??Sheri [name]??uses she/her/hers??pronouns.??    VISIT SUMMARY     Notes: Called [name] to check in/offer support. No answer and voice mailbox was full. Sent text offering support; no response received yet.      I will continue to follow [name] for AYA-appropriate support. [name] has my contact information and has been encouraged to contact me as needed.     02/09/2019     [name], LCSWA  Adolescent and Young Adult Clinical Social Worker  Pager: [phone]  Available by page to conduct virtual care due to COVID-19

## 2019-02-11 ENCOUNTER — Ambulatory Visit: Admit: 2019-02-11 | Discharge: 2019-02-12

## 2019-02-12 ENCOUNTER — Ambulatory Visit: Admit: 2019-02-12 | Discharge: 2019-02-13

## 2019-02-13 ENCOUNTER — Ambulatory Visit: Admit: 2019-02-13 | Discharge: 2019-02-14

## 2019-02-13 NOTE — Unmapped (Signed)
02/13/2019    Dose Site Summary:Rx:Lt SCV/Axi: 02/11/2019: 5,000/5,000 cGy  Rx:Lt Breast R: 02/12/2019: 5,000/5,000 cGy  ZO:XWRUEAVWUJ: 02/12/2019: 200/1,000 cGy  WJX:BJYNWGNFAO: 02/12/2019: 5,200 cGy    Subjective/Assessment/Recommendations:    1. Skin: Erythematous, Continue emollients and peeling  2. Lymphedema: Not present  3. Nutrition: No issues and Eating well  4. Fatigue: Mild  5. Pain: Mild pain under left arm  6. Elimination: No issues  7. Prescription Needs: None  8. Psychosocial: Has home support    Wt Readings from Last 3 Encounters:   02/04/19 (!) 139.5 kg (307 lb 8 oz)   01/30/19 (!) 140.3 kg (309 lb 3.2 oz)   01/21/19 (!) 139.3 kg (307 lb 1.6 oz)     Wt Readings from Last 3 Encounters:   02/04/19 (!) 139.5 kg (307 lb 8 oz)   01/30/19 (!) 140.3 kg (309 lb 3.2 oz)   01/21/19 (!) 139.3 kg (307 lb 1.6 oz)     Temp Readings from Last 3 Encounters:   01/30/19 36 ??C (96.8 ??F) (Temporal)   01/21/19 36.4 ??C (97.5 ??F) (Temporal)   01/09/19 36.2 ??C (97.2 ??F) (Temporal)     BP Readings from Last 3 Encounters:   12/15/18 143/67   12/12/18 141/80   11/13/18 139/73     Pulse Readings from Last 3 Encounters:   12/15/18 82   12/12/18 84   11/13/18 95

## 2019-02-14 NOTE — Unmapped (Signed)
Radiation Treatment Management Note    VISIT DATE: 02/13/2019    Diagnosis:  23 y.o.??female??with a history of cT2N0 IDC of the left breast, grade 3, weakly ER+, PR-/HER2- with suspicious lymph nodes on imaging??but core??biospy negative s/p neoadjuvant ddAC-->taxol/carboplatin??followed by left skin sparing??mastectomy + reconstruction with SLNB in 05/2018 showing ypT1bN0 disease, no LVI. 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    CURRENT TREATMENT:   Cumulative dose: 5000cGy / 5000cGy to left breast and nodes                               400cGy / 1000cGy scar boost      Subjective:   Sheri [name] has continued to come regularly to treatment this week with Sheri [name]. Her erythema is relatively stable although she has a larger area of dry desquamation in the inframammary fold (still no moist desquamation) and a bit more pain/heaviness in the breast. She is using mometasone regularly and taking ibuprofen for pain. She is very excited to be done next week.      PHYSICAL EXAMINATION:  KPS: 90  GENERAL:  Reveals a well-developed, well-nourished, person in no apparent distress who appears their stated age.   Breast: The incision in the left reconstructed breast is well healed without nodularity  Skin: Examination of the skin within the treatment field reveals continued grade 2 dermatitis over the left breast and left axilla with an enlarged area of dry desquamation in the inframammary fold.  Lymph: No palpable lymphadenopathy    ASSESSMENT AND PLAN:     1. Breast Cancer We will continue on with the course of treatment as planned.  I have reviewed and approved the appropriate quality assurance and targeting imaging.     2. Skin Care   Continue mometasone 2x a day to skin and silvadene as needed for areas of desquamation in inframammary fold. She will call me if she develops any worsening skin changes to discuss her skin regimen.     3. Anxiety   Continue ativan PRN for RT treatments. Does not drive after taking the ativan.     4. Follow Up:  She finishes treatment next Wednesday. She will see Dr. Avis Epley and her team in mid January and Dr. Dellis Anes in March. I will plan to see her back in follow up in June.       Sheri [name] L. Baird Lyons, MD  Assistant Professor  Department of Radiation Oncology

## 2019-02-16 ENCOUNTER — Ambulatory Visit: Admit: 2019-02-16 | Discharge: 2019-02-17

## 2019-02-17 ENCOUNTER — Ambulatory Visit: Admit: 2019-02-17 | Discharge: 2019-02-18

## 2019-02-18 ENCOUNTER — Ambulatory Visit: Admit: 2019-02-18 | Discharge: 2019-02-19

## 2019-02-19 NOTE — Unmapped (Signed)
RADIATION ONCOLOGY TREATMENT COMPLETION NOTE    Encounter Date: 02/18/2019  Patient Name: Sheri [name]  Medical Record Number: [id number]    Referring Physician: Dr. Dellis Anes, Dr. Avis Epley    DIAGNOSIS/ASSESSMENT:  23 y.o.??female??with a history of cT2N0 IDC of the left breast, grade 3, weakly ER+, PR-/HER2- with suspicious lymph nodes on imaging??but core??biospy negative s/p neoadjuvant ddAC-->taxol/carboplatin??followed by left skin sparing??mastectomy + reconstruction with SLNB in 05/2018 showing ypT1bN0 disease, no LVI. 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    TREATMENT INTENT: curative    CLINICAL TRIAL: no    CHEMOTHERAPY: administered before radiation    [facility]:  [facility]    RADIATION TREATMENT SUMMARY:          Treatment site Treatment Technique/Modality Energy Dose per fraction Total number  of fractions Total dose Start date End date   Left breast and regional nodes 3D CRT 10, 15 MV 200 cGy 25 5000 cGy 12/12/2018   02/12/2019   Left breast scar boost [employer] Electrons 6 MeV 200cGy 5 1000 cGy 02/12/2019 02/18/2019                         TOLERANCE TO TREATMENT: Sheri [name] did very well with the expected grade 2 dermatitis of the left breast most pronounced in the inframammary fold    COMPLETED INTENDED COURSE:  yes    TREATMENT BREAK > 2 WEEKS:  No, but she did have to miss many days scattered throughout treatment secondary to living and social factors as detailed in progress check notes.       PLAN FOR FOLLOW-UP: Sheri [name] is to return for follow up with Dr. Avis Epley in mid January to restart xeloda. She will then see Dr. Dellis Anes in 04/2019, and will follow up with me in 07/2019. In the meantime, she knows to call me with any questions or concerns.

## 2019-02-26 ENCOUNTER — Encounter: Admit: 2019-02-26 | Discharge: 2019-02-27 | Disposition: A | Discharge status: Home / Self Care | Payer: MEDICAID

## 2019-02-26 LAB — URINALYSIS WITH CULTURE REFLEX
BILIRUBIN UA: NEGATIVE
BLOOD UA: NEGATIVE
GLUCOSE UA: NEGATIVE
KETONES UA: NEGATIVE
LEUKOCYTE ESTERASE UA: NEGATIVE
NITRITE UA: NEGATIVE
PH UA: 7 (ref 5.0–9.0)
RBC UA: <1 /HPF (ref <4)
SPECIFIC GRAVITY UA: 1.025 (ref 1.005–1.040)
SQUAMOUS EPITHELIAL: 5 /HPF (ref 0–5)
UROBILINOGEN UA: 0.2
WBC UA: <1 /HPF (ref 0–5)

## 2019-02-26 LAB — CBC W/ AUTO DIFF
BASOPHILS ABSOLUTE COUNT: 0 10*9/L (ref 0.0–0.1)
BASOPHILS RELATIVE PERCENT: 0.4 %
EOSINOPHILS ABSOLUTE COUNT: 0.1 10*9/L (ref 0.0–0.7)
EOSINOPHILS RELATIVE PERCENT: 1.4 %
HEMATOCRIT: 31 % — ABNORMAL LOW (ref 35.0–44.0)
HEMOGLOBIN: 10.8 g/dL — ABNORMAL LOW (ref 12.0–15.5)
LYMPHOCYTES ABSOLUTE COUNT: 0.7 10*9/L (ref 0.7–4.0)
MEAN CORPUSCULAR HEMOGLOBIN CONC: 35 g/dL (ref 30.0–36.0)
MEAN CORPUSCULAR HEMOGLOBIN: 28.5 pg (ref 26.0–34.0)
MEAN CORPUSCULAR VOLUME: 81.6 fL — ABNORMAL LOW (ref 82.0–98.0)
MEAN PLATELET VOLUME: 7 fL (ref 7.0–10.0)
MONOCYTES ABSOLUTE COUNT: 0.5 10*9/L (ref 0.1–1.0)
MONOCYTES RELATIVE PERCENT: 6.4 %
NEUTROPHILS ABSOLUTE COUNT: 6.3 10*9/L (ref 1.7–7.7)
NEUTROPHILS RELATIVE PERCENT: 82.7 %
PLATELET COUNT: 295 10*9/L (ref 150–450)
RED BLOOD CELL COUNT: 3.8 10*12/L — ABNORMAL LOW (ref 3.90–5.03)
WBC ADJUSTED: 7.7 10*9/L (ref 3.5–10.5)

## 2019-02-26 LAB — BASIC METABOLIC PANEL
ANION GAP: 9 mmol/L (ref 7–15)
BLOOD UREA NITROGEN: 12 mg/dL (ref 7–21)
BUN / CREAT RATIO: 21
CALCIUM: 8.8 mg/dL (ref 8.5–10.2)
CO2: 23 mmol/L (ref 22.0–30.0)
CREATININE: 0.58 mg/dL — ABNORMAL LOW (ref 0.60–1.00)
EGFR CKD-EPI AA FEMALE: >90 mL/min/{1.73_m2} (ref >=60)
EGFR CKD-EPI NON-AA FEMALE: >90 mL/min/{1.73_m2} (ref >=60)
GLUCOSE RANDOM: 97 mg/dL (ref 70–179)
POTASSIUM: 4.1 mmol/L (ref 3.5–5.0)
SODIUM: 136 mmol/L (ref 135–145)

## 2019-02-26 LAB — PROTEIN UA: Protein:MCnc:Pt:Urine:Qn:Test strip: NEGATIVE

## 2019-02-26 LAB — CHLORIDE: Chloride:SCnc:Pt:Ser/Plas:Qn:: 104

## 2019-02-26 LAB — PREGNANCY TEST URINE: Choriogonadotropin (pregnancy test):PrThr:Pt:Urine:Ord:: NEGATIVE

## 2019-02-26 LAB — PLATELET COUNT: Platelets:NCnc:Pt:Bld:Qn:Automated count: 295

## 2019-02-26 NOTE — Unmapped (Signed)
Called   Sheri [name] in response to My Chart basket re confusion over the past several days, going to the grocery store and not knowing where she was. She presently was at her Moms work. She informs me she got little confused driving there. I instructed her to have her Mom or someone take her directly to a local ER to have her head xray. I also said it could be a medication problem but we need to find out tonight. She agrees to ask her MOm to take her and if she will not will have her boyfriend. She verbally tells me thanks for calling her. I repeatedly tell her she must go to an ER now.

## 2019-02-27 MED ADMIN — iohexoL (OMNIPAQUE) 350 mg iodine/mL solution 100 mL: 100 mL | INTRAVENOUS | @ 04:00:00 | Stop: 2019-02-26

## 2019-02-27 MED ADMIN — lactated ringers bolus 1,000 mL: 1000 mL | INTRAVENOUS | @ 03:00:00 | Stop: 2019-02-27

## 2019-02-27 NOTE — Unmapped (Signed)
Pt is coming in with c/o having some confusion and not feeling well for a couple of days now. Went to the store the other night and couldn't remember how to get home.

## 2019-02-27 NOTE — Unmapped (Signed)
Hem/Onc Phone Triage Note    Caller: Dr. Bryon Lions, [facility]    Reason for Call:   Sheri [name] is a 24 y.o. woman with hx of essentially TNBC (weakly ER+ only) s/p NAC w/ ddAC-TC, partial mastectomy with residual disease present on reconstruction and adjuvant [place] who presents to [facility] ER with brief episodes of memory loss. CT head with 1cm frontal hypodensity, MRI recommended. The remainder of her evaluation is grossly unremarkable. Pt is afebrile, HD stable, currently A/O x 3. ER provider is requesting help with disposition in terms of MRI- should patient transfer to [facility] for study vs schedule as outpatient.    Assessment/Plan:   --Difficult to say if this truly represents a symptomatic brain lesion. However, her memory lapses were severe enough to prompt pt to seek medical attention and her story on presentation in conjunction with CT findings were convincing enough to request oncology input. With all of this taken into context it is our opinion that she seek a transfer to [facility] ER for MRI of the Brain and a more definitive assessment.      Please page Oncology Consults at [phone] if patient needs admission or questions about care occur.     Fellow Taking Call:  Jill Alexanders  February 27, 2019 1:54 AM

## 2019-02-27 NOTE — Unmapped (Signed)
[facility]  Emergency Department Provider Note    ED Clinical Impression     Final diagnoses:   Abnormal CT of the head (Primary)     Initial Impression, ED Course, Assessment and Plan     Impression:   24 y.o. female with pmh of reast cancer s/p left mastectomy, chemo and radiation; anxiety, depression and PTSD; presents for episodes of transient memory loss. Vitals significant for tachycardia, but improved on repeat, otherwise within normal limits. Physical exam reveals nontoxic-appearing female normal neurologic exam, she is conversational and is in no distress.  Boyfriend is at bedside.  History is concerning for anxiety/fugue state versus possible absence seizure (although think this less likely given patient is often moving during these episodes) versus new brain metastasis. Workup will include basic labs, UA, UPT, ECG and CT head.    ED Course as of Feb 26 137   Thu Feb 26, 2019   1739 Heart Rate: 115   1953 Heart Rate: 90   2205 Preg Test, Ur: Negative   2205 On my interpretation, ECG with normal sinus rhythm, no ST elevations or T wave inversions concerning for acute ischemia, no findings concerning for arrhythmia, such as Brugada, WPW, V. Tach, or HOCM. Mild prolongation of Qtc, not clinically significant today.      Fri Feb 27, 2019   0013 CT head IMPRESSION:  --There is a hypodense focus in the left frontal lobe measuring up to 1 cm with an adjacent focus of possible enhancement. Findings are nonspecific. Further evaluation could be obtained with MRI of the brain.      2956 Heme/Onc paged [id number] Have updated patient on CT findings.  Have discussed that she needs an MRI to better characterize the small mass noted, as it is poorly characterized on CT scan.  Patient states understanding.  We have discussed options of transfer to [facility] for MRI tonight versus attempting to schedule it as an outpatient.  Patient reports desire to go home tonight.  Do think this is reasonable.  She is also aware that she may return to the emergency department tomorrow to get the MRI.  We will touch base with oncology prior to discharge.      0127 Received call back from oncology fellow, he has spoken with his attending, states recommendation for transfer to [facility] for MRI.    Have discussed this recommendation with patient, her boyfriend and mother are at the hospital.  State preference to go by private vehicle.  Do think they are reliable and reasonable.  Mother was going to drive.  Will discharge with plan to go immediately to main hospital to continue her work-up.        Patient accepted by Dr. Willow Ora.  Will go by private vehicle to [facility].  ____________________________________________    Time seen: February 27, 2019 12:06 AM    I have reviewed the triage vital signs and the nursing notes.  I have discussed the case with the ED Attending.     History     Chief Complaint  Altered Mental Status    HPI Sheri [name] is a 24 y.o. female with pmh of breast cancer s/p left mastectomy, chemo and radiation; anxiety, depression and PTSD; presents for memory problems.  Patient reports over the past month she has had episodes in which she cannot remember what she is doing.  She will remember being in a car, and the next thing she will know she will be in  new location, she does not remember the transfer in between.  She reports speaking with friends, the friends will reference something she said minutes later and she will not remember the conversation.  Reports she had similar episodes while in high school, she would be on one side of the school and then all of a sudden be in the next side of the school, she did not remember walking in the hallways.  She was evaluated for these episodes and was told that they were more likely associated with anxiety and fugue episodes.  She has no prior history of seizures, and during these episodes she is often in transit between places, she does not sit or stand still.  Denies numbness, weakness.  No recent fevers, chills or other sick symptoms such as vomiting, diarrhea, cough or body pains.  No recent trauma.  He does report some intermittent lightheadedness and dizziness over the past month, but not currently feeling lightheaded or dizzy.  She is able to tolerate p.o. and has no changes to her bowel movements.    Past Medical History:   Diagnosis Date   ??? Anxiety    ??? Depression    ??? Malignant neoplasm of upper-outer quadrant of left breast in female, estrogen receptor negative (CMS-HCC) 12/10/2017   ??? Panic disorder    ??? PTSD (post-traumatic stress disorder)        Past Surgical History:   Procedure Laterality Date   ??? AUGMENTATION MAMMAPLASTY Left 08/2018    left only    ??? BREAST BIOPSY Left 2019    malignant   ??? CHEMOTHERAPY Left 11/2017    May 13 2018   ??? IR INSERT PORT AGE GREATER THAN 5 YRS  12/18/2017 IR INSERT PORT AGE GREATER THAN 5 YRS 12/18/2017 [name], MD IMG VIR [facility]   ??? MASTECTOMY Left 05/2018   ??? PR BREAST RECONSTRUC W TISS EXPANDR Left 05/30/2018    Procedure: BREAST RECON IMMED/DELAY W/EXPANDR W/SUBSQT EXPA;  Surgeon: Arsenio Katz, MD;  Location: ASC OR [facility];  Service: Plastics   ??? PR BX/REMV,LYMPH NODE,DEEP AXILL Left 05/30/2018    Procedure: BX/EXC LYMPH NODE; OPEN, DEEP AXILRY NODE;  Surgeon: Talbert Cage, DO;  Location: ASC OR [facility];  Service: Surgical Oncology Breast   ??? PR EXC TUMOR SOFT TISSUE NECK/ANT THORAX SUBQ 3+CM Right 10/23/2018    Procedure: Priority EXCISION, TUMOR, SOFT TISSUE OF NECK OR ANTERIOR THORAX, SUBCUTANEOUS; 3 CM OR GREATER;  Surgeon: Talbert Cage, DO;  Location: ASC OR [facility];  Service: Surgical Oncology Breast   ??? PR IMPLNT BIO IMPLNT FOR SOFT TISSUE REINFORCEMENT Left 05/30/2018    Procedure: IMPLANTATION BIOLOGIC IMPLANT(EG, ACELLULAR DERMAL MATRIX) FOR SOFT TISSUE REINFORCEMENT(EG, BREAST, TRUNK);  Surgeon: Arsenio Katz, MD;  Location: ASC OR [facility];  Service: Plastics   ??? PR INSERT BREAST PROS IMMED AFTER EXCIS Left 05/30/2018    Procedure: IMMED INSRT BREAST PROSTH AFTER MASTOPEX/MASTECT;  Surgeon: Arsenio Katz, MD;  Location: ASC OR [facility];  Service: Plastics   ??? PR INTRAOPERATIVE SENTINEL LYMPH NODE ID W DYE INJECTION Left 05/30/2018    Procedure: INTRAOPERATIVE IDENTIFICATION SENTINEL LYMPH NODE(S) INCLUDE INJECTION NON-RADIOACTIVE DYE, WHEN PERFORMED;  Surgeon: Talbert Cage, DO;  Location: ASC OR [facility];  Service: Surgical Oncology Breast   ??? PR MASTECTOMY, SIMPLE, COMPLETE Left 05/30/2018    Procedure: Urgent MASTECTOMY, SIMPLE, COMPLETE;  Surgeon: Talbert Cage, DO;  Location: ASC OR [facility];  Service: Surgical Oncology Breast   ??? PR RECMPL WND  TRUNK 2.6-7.5 CM Left 10/23/2018 Procedure: REPR COMPLX TRUNK; 2.6 CM TO 7.5 CM;  Surgeon: Arsenio Katz, MD;  Location: ASC OR [facility];  Service: Plastics   ??? PR REPLACE TISSUE EXPANDER Left 09/24/2018    Procedure: REPLACEMENT OF TISSUE EXPANDER WITH PERMANENT PROSTHESIS;  Surgeon: Arsenio Katz, MD;  Location: MAIN OR [facility];  Service: Plastics   ??? PR REVISE BREAST RECONSTRUCTION Left 09/24/2018    Procedure: R21 - REVISION OF RECONSTRUCTED  BREAST;  Surgeon: Arsenio Katz, MD;  Location: MAIN OR [facility];  Service: Plastics   ??? PR SUSPENSION OF BREAST Right 09/24/2018    Procedure: MASTOPEXY FOR SYMMETRY PURPOSES;  Surgeon: Arsenio Katz, MD;  Location: MAIN OR [facility];  Service: Plastics       No current facility-administered medications for this encounter.     Current Outpatient Medications:   ???  acetaminophen (TYLENOL) 325 MG tablet, Take 325 mg by mouth every six (6) hours as needed for pain., Disp: , Rfl:   ???  ARIPiprazole (ABILIFY) 2 MG tablet, Take 1 tablet (2 mg total) by mouth daily., Disp: 30 tablet, Rfl: 1  ???  capecitabine (XELODA) 500 MG tablet, Take 3 tablets (1,500 mg total) by mouth Two (2) times a day . 14 days on then 7 days off , then repeat., Disp: 84 tablet, Rfl: 8  ???  hydrocortisone 2.5 % cream, Apply to affected area twice daily, Disp: 28 g, Rfl: 0  ???  ibuprofen (MOTRIN) 800 MG tablet, Take 1 tablet (800 mg total) by mouth every eight (8) hours as needed for pain., Disp: 70 tablet, Rfl: 2  ???  loperamide (IMODIUM) 2 mg capsule, Take 2 mg by mouth 4 (four) times a day as needed., Disp: , Rfl:   ???  mometasone (ELOCON) 0.1 % cream, Apply to affected area twice daily, Disp: 45 g, Rfl: 1  ???  pantoprazole (PROTONIX) 40 MG tablet, Take 1 tablet (40 mg total) by mouth daily., Disp: 30 tablet, Rfl: 0  ???  sertraline (ZOLOFT) 100 MG tablet, Take 1 and 1/2 tablets (150 mg total) by mouth daily., Disp: 45 tablet, Rfl: 0 ???  silver sulfaDIAZINE (SILVADENE, SSD) 1 % cream, Apply to affected open area underneath breast daily, Disp: 50 g, Rfl: 1    Allergies  Amoxicillin    Family History   Problem Relation Age of Onset   ??? Skin cancer Mother    ??? Breast cancer Neg Hx    ??? Colon cancer Neg Hx    ??? Endometrial cancer Neg Hx    ??? Ovarian cancer Neg Hx      Social History  Social History     Tobacco Use   ??? Smoking status: Never Smoker   ??? Smokeless tobacco: Never Used   Substance Use Topics   ??? Alcohol use: Not Currently   ??? Drug use: Never     Review of Systems  General: No unintentional weight loss  GU: No discharge   Integumentary: No rash  All 10 systems have been reviewed and are negative except as otherwise documented.    Physical Exam     VITAL SIGNS:    ED Triage Vitals [02/26/19 1739]   Enc Vitals Group      BP 157/103      Heart Rate 115      SpO2 Pulse 115      Resp 16      Temp 36.8 ??C (98.2 ??F)      Temp  Source Oral      SpO2 97 %      Weight (!) 138.3 kg (305 lb)      Height 1.676 m (5' 6)     Constitutional: Alert and oriented.  Nontoxic appearing and in no distress.  Eyes: Conjunctivae are normal.  ENT       Head: Normocephalic and atraumatic.       Nose: No congestion.       Mouth/Throat: Mucous membranes are moist.       Neck: No stridor.  Cardiovascular: Normal rate, regular rhythm. Extremities warm and well perfused.  Respiratory: Normal respiratory effort, breathing comfortably on room air. Breath sounds are normal.  Gastrointestinal: Soft, nontender and nondistended. There is no CVA tenderness.  Genitourinary: deferred  Musculoskeletal: Nontender and non-edematous with equal range of motion in all extremities. Neurologic: Normal speech and language. No gross focal neurologic deficits are appreciated. No gross focal neurologic deficits are appreciated. CN 2-12 intact, no facial assymmetry, PERRLA, EOMI with no nystagmus, normal saccades, strength 5/5 in R upper extremity, strength 5/5 in L upper extremity, strength 5/5 in R lower extremity, strength 5/5 in L lower extremity, FTN intact, no pronator drift, sensation grossly intact. A&Ox4. Gait stable. No slurred speech, dysarthria or aphasia.  Skin: Skin is warm, dry and intact. No rash noted.  Psychiatric: Mood and affect are congruent. Speech and behavior are appropriate.    Radiology     Ecg 12 Lead (adult)    Result Date: 02/26/2019  NORMAL SINUS RHYTHM PROLONGED QT ABNORMAL ECG NO PREVIOUS ECGS AVAILABLE    Ct Head W Contrast    Result Date: 02/27/2019  EXAM: Computed tomography, head or brain with contrast material. DATE: 02/26/2019 10:33 PM ACCESSION: [id number] UN DICTATED: 02/26/2019 11:42 PM INTERPRETATION LOCATION: [facility]     CLINICAL INDICATION: 24 years old Female with New AMS/memory lapses, possible seizures in breast ca pt concern for possible mets ; Altered mental status      COMPARISON: None     TECHNIQUE: Axial CT images of the head  from skull base to vertex with contrast.     FINDINGS: There is a hypodense focus in the left frontal lobe measuring approximately 1 x 0.9 x 0.7 cm with an adjacent focus of possible enhancement (2:13-14). There is no midline shift. There is no evidence of acute infarct. No acute intracranial hemorrhage. No fractures are evident. The sinuses are pneumatized.         --There is a hypodense focus in the left frontal lobe measuring up to 1 cm with an adjacent focus of possible enhancement. Findings are nonspecific. Further evaluation could be obtained with MRI of the brain.    Laboratory Data     Lab Results   Component Value Date    WBC 7.7 02/26/2019    HGB 10.8 (L) 02/26/2019    HCT 31.0 (L) 02/26/2019 PLT 295 02/26/2019       Lab Results   Component Value Date    NA 136 02/26/2019    K 4.1 02/26/2019    CL 104 02/26/2019    CO2 23.0 02/26/2019    BUN 12 02/26/2019    CREATININE 0.58 (L) 02/26/2019    GLU 97 02/26/2019    CALCIUM 8.8 02/26/2019       Lab Results   Component Value Date    BILITOT 0.4 11/04/2018    BILIDIR <0.10 10/21/2018    PROT 7.1 11/04/2018    ALBUMIN 4.2 11/04/2018  ALT 29 11/04/2018    AST 21 11/04/2018    ALKPHOS 139 (H) 11/04/2018       No results found for: LABPROT, INR, APTT    Pertinent labs & imaging results that were available during my care of the patient were reviewed by me and considered in my medical decision making (see chart for details).     Sherlyn Lick, MD  Resident  02/27/19 [phone]

## 2019-02-28 ENCOUNTER — Encounter: Admit: 2019-02-28 | Discharge: 2019-02-28 | Disposition: A | Discharge status: Home / Self Care | Payer: MEDICAID

## 2019-02-28 DIAGNOSIS — R4182 Altered mental status, unspecified: Principal | ICD-10-CM

## 2019-02-28 LAB — BASIC METABOLIC PANEL
ANION GAP: 9 mmol/L (ref 7–15)
BLOOD UREA NITROGEN: 10 mg/dL (ref 7–21)
BUN / CREAT RATIO: 17
CALCIUM: 8.8 mg/dL (ref 8.5–10.2)
CHLORIDE: 108 mmol/L — ABNORMAL HIGH (ref 98–107)
CO2: 22 mmol/L (ref 22.0–30.0)
CREATININE: 0.58 mg/dL — ABNORMAL LOW (ref 0.60–1.00)
EGFR CKD-EPI AA FEMALE: >90 mL/min/{1.73_m2} (ref >=60)
EGFR CKD-EPI NON-AA FEMALE: >90 mL/min/{1.73_m2} (ref >=60)
GLUCOSE RANDOM: 114 mg/dL (ref 70–179)
POTASSIUM: 4 mmol/L (ref 3.5–5.0)

## 2019-02-28 LAB — BLOOD UREA NITROGEN: Urea nitrogen:MCnc:Pt:Ser/Plas:Qn:: 10

## 2019-02-28 NOTE — Unmapped (Signed)
Summary: Pt. Has Breast CA.  Needs MRI for new mets to the brain

## 2019-02-28 NOTE — Unmapped (Signed)
[facility]  Emergency Department Provider Note    Initial Impression, ED Course, Assessment and Plan     Impression: 24 y.o. female with history of breast cancer status post post left mastectomy, chemotherapy, radiation presents the emergency department for evaluation of difficulty with memory intermittent lightheaded and dizziness.  Was seen at [facility] last night and she had a CT which showed a 1 cm hyperdense focus.  She was initially supposed to be transferred here by private vehicle but went home and slept first.    Patient appears comfortable and is in no acute distress.  Vitals notable for heart rate of 101 but otherwise within normal limits.  Cardiopulmonary exam, neuro exam benign.    BP 143/83  - Pulse 101  - Temp 36.7 ??C (98.1 ??F)  - Resp 16  - Ht 167.6 cm (5' 6)  - Wt (!) 138.3 kg (305 lb)  - SpO2 99%  - BMI 49.23 kg/m??      Assessment/Plan: Patient presentation, history of malignancy in the setting of questionable CT scan concerning for intracranial mass.  We will proceed with MRI to evaluate for presence of metastatic lesion versus other intracranial pathology.      ED Course:   ED Course as of Feb 27 537   Sat Feb 28, 2019   0533 FINDINGS:    There is no focal parenchymal signal abnormality. Ventricles are normal in size. There is no midline shift. No extra-axial fluid collection. No evidence of intracranial hemorrhage. Mildly prominent Lorayne Bender spaces on the right. No diffusion weighted signal abnormality to suggest acute infarct. Descent of the inferior cerebellar tonsils below the foramen magnum, suggestive of Chiari I malformation.  ??  No mass. There is no abnormal enhancement.  ??  IMPRESSION:  ??  No evidence of intracranial metastasis. No acute intracranial abnormality.    MRI Brain W Wo Contrast MDM/Disposition: MRI reassuring.  No mass lesion found.  Patient's difficulty with memory may be due to use of chemotherapy I instructed her to follow-up with oncologist regarding further concerns.    At this time the patient is well appearing, has stable vitals, and would not benefit from hospital admission. Patient is agreeable to discharge and will follow up with an outpatient provider as needed. Return precautions discussed and all questions were appropriately answered.    _____________________________________________________    The case was discussed with the attending physician who is in agreement with the above assessment and plan.    ED Clinical Impression     Final diagnoses:   Altered mental status, unspecified altered mental status type (Primary)       Additional Medical Decision Making     Time seen: February 28, 2019 2:37 AM    I have reviewed the vital signs and the nursing notes. Labs and radiology results that were available during my care of the patient were independently reviewed by me and considered in my medical decision making.     I independently visualized the EKG tracing if performed  I independently visualized the radiology images if performed  I reviewed the patient's prior medical records if available.  Additional history obtained from family if available    Please note- This chart has been created using [employer]. Chart creation errors have been sought, but may not always be located and such creation errors, especially pronoun confusion, do NOT reflect on the standard of medical care.      History     Chief Complaint  Headache  Recurrent or Known Dx Migraines      History provided by patient.     HPI Sheri [name] is a 24 y.o. female with history of breast cancer status post post left mastectomy chemotherapy radiation with last received IV chemotherapy in March.  Was given p.o. chemotherapy until October of this year and is now receiving radiation therapy.  She is set to restart her p.o. chemotherapy in January 12 of this year.  Patient states that for about 1 to 2 months of difficulty with memory.  She reports multiple conversations as she is unable to recall the even minutes later.  She will often walk places and not know where she is going.  She denies headache.  She does report intermittent lightheadedness and dizziness.    She was seen in the [facility] ED last night for the same symptoms.  They got pregnancy test which was negative.  They had a CT head which showed a hypodense focus that measured 1 cm.  This was nonspecific but concerning for metastasis to the brain.  She was supposed to be transferred here by private vehicle.  However she went home and rested first.        ROS - 10 point review of systems was performed and is negative unless otherwise noted in HPI     CONST: Does not endorse fever/chills  NEURO: Does not endorse headache  NECK: Does not endorse neck stiffness  ENT: Does not endorse congestion or sore throat  CV: Does not endorse chest pain, syncope  RESP: Does not endorse cough or shortness of breath.  GI: Does not endorse nausea, vomiting, or abdominal pain  HEME: Does not endorse rectal bleeding  GU: Does not endorse dysuria  SKIN: Does not endorse skin lesions      PAST MEDICAL HISTORY/PAST SURGICAL HISTORY:   Past Medical History:   Diagnosis Date   ??? Anxiety    ??? Depression    ??? Malignant neoplasm of upper-outer quadrant of left breast in female, estrogen receptor negative (CMS-HCC) 12/10/2017   ??? Panic disorder    ??? PTSD (post-traumatic stress disorder)        Patient Active Problem List   Diagnosis ??? Malignant neoplasm of upper-outer quadrant of left breast in female, estrogen receptor negative (CMS-HCC)   ??? Microcytic anemia   ??? Symptomatic anemia   ??? PTSD (post-traumatic stress disorder)   ??? 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 Anxiety disorder   ??? S/P breast reconstruction       Past Surgical History:   Procedure Laterality Date   ??? AUGMENTATION MAMMAPLASTY Left 08/2018    left only    ??? BREAST BIOPSY Left 2019    malignant   ??? CHEMOTHERAPY Left 11/2017    May 13 2018   ??? IR INSERT PORT AGE GREATER THAN 5 YRS  12/18/2017    IR INSERT PORT AGE GREATER THAN 5 YRS 12/18/2017 [name], MD IMG [facility]   ??? MASTECTOMY Left 05/2018   ??? PR BREAST RECONSTRUC W TISS EXPANDR Left 05/30/2018    Procedure: BREAST RECON IMMED/DELAY W/EXPANDR W/SUBSQT EXPA;  Surgeon: Arsenio Katz, MD;  Location: ASC OR [facility];  Service: Plastics   ??? PR BX/REMV,LYMPH NODE,DEEP AXILL Left 05/30/2018    Procedure: BX/EXC LYMPH NODE; OPEN, DEEP AXILRY NODE;  Surgeon: Talbert Cage, DO;  Location: ASC OR [facility];  Service: Surgical Oncology Breast   ??? PR EXC TUMOR SOFT TISSUE NECK/ANT THORAX SUBQ  3+CM Right 10/23/2018    Procedure: Priority EXCISION, TUMOR, SOFT TISSUE OF NECK OR ANTERIOR THORAX, SUBCUTANEOUS; 3 CM OR GREATER;  Surgeon: Talbert Cage, DO;  Location: ASC OR [facility];  Service: Surgical Oncology Breast   ??? PR IMPLNT BIO IMPLNT FOR SOFT TISSUE REINFORCEMENT Left 05/30/2018    Procedure: IMPLANTATION BIOLOGIC IMPLANT(EG, ACELLULAR DERMAL MATRIX) FOR SOFT TISSUE REINFORCEMENT(EG, BREAST, TRUNK);  Surgeon: Arsenio Katz, MD;  Location: ASC OR [facility];  Service: Plastics   ??? PR INSERT BREAST PROS IMMED AFTER EXCIS Left 05/30/2018    Procedure: IMMED INSRT BREAST PROSTH AFTER MASTOPEX/MASTECT;  Surgeon: Arsenio Katz, MD;  Location: ASC OR [facility];  Service: Plastics   ??? PR INTRAOPERATIVE SENTINEL LYMPH NODE ID W DYE INJECTION Left 05/30/2018 Procedure: INTRAOPERATIVE IDENTIFICATION SENTINEL LYMPH NODE(S) INCLUDE INJECTION NON-RADIOACTIVE DYE, WHEN PERFORMED;  Surgeon: Talbert Cage, DO;  Location: ASC OR [facility];  Service: Surgical Oncology Breast   ??? PR MASTECTOMY, SIMPLE, COMPLETE Left 05/30/2018    Procedure: Urgent MASTECTOMY, SIMPLE, COMPLETE;  Surgeon: Talbert Cage, DO;  Location: ASC OR [facility];  Service: Surgical Oncology Breast   ??? PR RECMPL WND TRUNK 2.6-7.5 CM Left 10/23/2018    Procedure: REPR COMPLX TRUNK; 2.6 CM TO 7.5 CM;  Surgeon: Arsenio Katz, MD;  Location: ASC OR [facility];  Service: Plastics   ??? PR REPLACE TISSUE EXPANDER Left 09/24/2018    Procedure: REPLACEMENT OF TISSUE EXPANDER WITH PERMANENT PROSTHESIS;  Surgeon: Arsenio Katz, MD;  Location: MAIN OR [facility];  Service: Plastics   ??? PR REVISE BREAST RECONSTRUCTION Left 09/24/2018    Procedure: R21 - REVISION OF RECONSTRUCTED  BREAST;  Surgeon: Arsenio Katz, MD;  Location: MAIN OR [facility];  Service: Plastics   ??? PR SUSPENSION OF BREAST Right 09/24/2018    Procedure: MASTOPEXY FOR SYMMETRY PURPOSES;  Surgeon: Arsenio Katz, MD;  Location: MAIN OR [facility];  Service: Plastics       MEDICATIONS:   No current facility-administered medications for this encounter.     Current Outpatient Medications:   ???  acetaminophen (TYLENOL) 325 MG tablet, Take 325 mg by mouth every six (6) hours as needed for pain., Disp: , Rfl:   ???  ARIPiprazole (ABILIFY) 2 MG tablet, Take 1 tablet (2 mg total) by mouth daily., Disp: 30 tablet, Rfl: 1  ???  capecitabine (XELODA) 500 MG tablet, Take 3 tablets (1,500 mg total) by mouth Two (2) times a day . 14 days on then 7 days off , then repeat., Disp: 84 tablet, Rfl: 8  ???  hydrocortisone 2.5 % cream, Apply to affected area twice daily, Disp: 28 g, Rfl: 0  ???  ibuprofen (MOTRIN) 800 MG tablet, Take 1 tablet (800 mg total) by mouth every eight (8) hours as needed for pain., Disp: 70 tablet, Rfl: 2 ???  loperamide (IMODIUM) 2 mg capsule, Take 2 mg by mouth 4 (four) times a day as needed., Disp: , Rfl:   ???  mometasone (ELOCON) 0.1 % cream, Apply to affected area twice daily, Disp: 45 g, Rfl: 1  ???  pantoprazole (PROTONIX) 40 MG tablet, Take 1 tablet (40 mg total) by mouth daily., Disp: 30 tablet, Rfl: 0  ???  sertraline (ZOLOFT) 100 MG tablet, Take 1 and 1/2 tablets (150 mg total) by mouth daily., Disp: 45 tablet, Rfl: 0  ???  silver sulfaDIAZINE (SILVADENE, SSD) 1 % cream, Apply to affected open area underneath breast daily, Disp: 50 g, Rfl: 1    ALLERGIES:  Amoxicillin    SOCIAL HISTORY:   Social History     Tobacco Use   ??? Smoking status: Never Smoker   ??? Smokeless tobacco: Never Used   Substance Use Topics   ??? Alcohol use: Not Currently       FAMILY HISTORY:  Family History   Problem Relation Age of Onset   ??? Skin cancer Mother    ??? Breast cancer Neg Hx    ??? Colon cancer Neg Hx    ??? Endometrial cancer Neg Hx    ??? Ovarian cancer Neg Hx           Physical Exam     ED Triage Vitals [02/28/19 0119]   Enc Vitals Group      BP 143/83      Heart Rate 101      SpO2 Pulse       Resp 16      Temp 36.7 ??C (98.1 ??F)      Temp src       SpO2 99 %      Weight (!) 138.3 kg (305 lb)      Height 1.676 m (5' 6)      Head Circumference       Peak Flow       Pain Score       Pain Loc       Pain Edu?       Excl. in GC?        EXAM     CONST: Appears stated age, answers questions appropriately.  HEENT: Normocephalic and atraumatic. Conjunctivae clear. No congestion, epistaxis. Moist mucous membranes.   CV: RRR, normal S1/S2, no murmurs. Intact peripheral pulse. No appreciated peripheral edema appreciated.  RESP: Normal respiratory effort. Lungs clear to auscultation bilaterally without wheezes or crackles.  ABD: Abdomen soft, non tender, non distended, without rebound or guarding. no masses noted.  MSK: Normal range of motion in all extremities. Able to ambulate.   SKIN: Warm, dry and intact without rashes. HEME: No petechiae or bruising  PSYCH: Mood and affect are normal. Speech and behavior are normal.  NEURO: Pupils round and reactive bilaterally, extraocular motions intact without nystagmus, sensation to light touch in face intact and equal bilaterally, no facial asymmetry noted, no tongue or uvular deviation noted, no dysarthria noted.  Sensation to light touch intact and equal bilaterally in upper extremities and lower extremities.  Strength 5 /5 in handgrip, upper extremities, lower extremities bilaterally.  Finger-to-nose testing intact.  Able to ambulate without difficulty.        Radiology     MRI Brain W Wo Contrast   Preliminary Result      No evidence of intracranial metastasis. No acute intracranial abnormality.         Mri Brain W Wo Contrast    Result Date: 02/28/2019  EXAM: Magnetic resonance imaging, brain, without and with contrast material. DATE: 02/28/2019 3:40 AM ACCESSION: [id number] UN DICTATED: 02/28/2019 3:59 AM INTERPRETATION LOCATION: [facility] CLINICAL INDICATION: 24 years old Female with brain tumor protocol; neuro deficits ; Brain mass or lesion, follow - up  COMPARISON: CT head 02/26/2019 TECHNIQUE: Multiplanar, multisequence MR imaging of the brain was performed without and with I.V. contrast. FINDINGS:  There is no focal parenchymal signal abnormality. Ventricles are normal in size. There is no midline shift. No extra-axial fluid collection. No evidence of intracranial hemorrhage. Mildly prominent Lorayne Bender spaces on the right. No diffusion weighted signal abnormality to suggest acute infarct. Descent  of the inferior cerebellar tonsils below the foramen magnum, suggestive of Chiari I malformation. No mass. There is no abnormal enhancement.     No evidence of intracranial metastasis. No acute intracranial abnormality.        LABORATORY DATA:     Results for orders placed or performed during the hospital encounter of 02/28/19   Basic metabolic panel   Result Value Ref Range Sodium 139 135 - 145 mmol/L    Potassium 4.0 3.5 - 5.0 mmol/L    Chloride 108 (H) 98 - 107 mmol/L    CO2 22.0 22.0 - 30.0 mmol/L    Anion Gap 9 7 - 15 mmol/L    BUN 10 7 - 21 mg/dL    Creatinine 1.61 (L) 0.60 - 1.00 mg/dL    BUN/Creatinine Ratio 17     EGFR CKD-EPI Non-African American, Female >90 >=60 mL/min/1.51m2    EGFR CKD-EPI African American, Female >90 >=60 mL/min/1.39m2    Glucose 114 70 - 179 mg/dL    Calcium 8.8 8.5 - 09.6 mg/dL          Judie Petit, MD  Resident  02/28/19 [phone]

## 2019-03-10 DIAGNOSIS — C50412 Malignant neoplasm of upper-outer quadrant of left female breast: Principal | ICD-10-CM

## 2019-03-10 DIAGNOSIS — K209 Esophagitis, unspecified: Principal | ICD-10-CM

## 2019-03-10 DIAGNOSIS — Z171 Estrogen receptor negative status [ER-]: Principal | ICD-10-CM

## 2019-03-10 MED ORDER — PANTOPRAZOLE 40 MG TABLET,DELAYED RELEASE
ORAL_TABLET | Freq: Every day | ORAL | 0 refills | 30.00000 days | Status: CP
Start: 2019-03-10 — End: 2019-04-09

## 2019-03-17 ENCOUNTER — Ambulatory Visit: Admit: 2019-03-17 | Discharge: 2019-03-18 | Attending: Adult Health | Primary: Adult Health

## 2019-03-17 ENCOUNTER — Other Ambulatory Visit: Admit: 2019-03-17 | Discharge: 2019-03-18

## 2019-03-17 DIAGNOSIS — C50412 Malignant neoplasm of upper-outer quadrant of left female breast: Principal | ICD-10-CM

## 2019-03-17 DIAGNOSIS — Z171 Estrogen receptor negative status [ER-]: Secondary | ICD-10-CM

## 2019-03-17 LAB — CBC W/ AUTO DIFF
BASOPHILS ABSOLUTE COUNT: 0 10*9/L (ref 0.0–0.1)
BASOPHILS RELATIVE PERCENT: 0.2 %
EOSINOPHILS ABSOLUTE COUNT: 0.1 10*9/L (ref 0.0–0.4)
EOSINOPHILS RELATIVE PERCENT: 1.9 %
HEMATOCRIT: 34.7 % — ABNORMAL LOW (ref 36.0–46.0)
HEMOGLOBIN: 11.8 g/dL — ABNORMAL LOW (ref 12.0–16.0)
LARGE UNSTAINED CELLS: 1 % (ref 0–4)
LYMPHOCYTES ABSOLUTE COUNT: 0.7 10*9/L — ABNORMAL LOW (ref 1.5–5.0)
LYMPHOCYTES RELATIVE PERCENT: 9.2 %
MEAN CORPUSCULAR HEMOGLOBIN CONC: 34.1 g/dL (ref 31.0–37.0)
MEAN CORPUSCULAR HEMOGLOBIN: 27.9 pg (ref 26.0–34.0)
MEAN CORPUSCULAR VOLUME: 81.8 fL (ref 80.0–100.0)
MEAN PLATELET VOLUME: 7.1 fL (ref 7.0–10.0)
MONOCYTES ABSOLUTE COUNT: 0.4 10*9/L (ref 0.2–0.8)
MONOCYTES RELATIVE PERCENT: 4.6 %
NEUTROPHILS ABSOLUTE COUNT: 6.3 10*9/L (ref 2.0–7.5)
NEUTROPHILS RELATIVE PERCENT: 83.2 %
PLATELET COUNT: 341 10*9/L (ref 150–440)
RED BLOOD CELL COUNT: 4.24 10*12/L (ref 4.00–5.20)
RED CELL DISTRIBUTION WIDTH: 16.5 % — ABNORMAL HIGH (ref 12.0–15.0)
WBC ADJUSTED: 7.6 10*9/L (ref 4.5–11.0)

## 2019-03-17 LAB — COMPREHENSIVE METABOLIC PANEL
ALBUMIN: 3.8 g/dL (ref 3.5–5.0)
ALKALINE PHOSPHATASE: 156 U/L — ABNORMAL HIGH (ref 38–126)
ALT (SGPT): 13 U/L (ref <35)
ANION GAP: 10 mmol/L (ref 7–15)
BILIRUBIN TOTAL: 0.6 mg/dL (ref 0.0–1.2)
BLOOD UREA NITROGEN: 7 mg/dL (ref 7–21)
BUN / CREAT RATIO: 11
CALCIUM: 9 mg/dL (ref 8.5–10.2)
CHLORIDE: 104 mmol/L (ref 98–107)
CO2: 23 mmol/L (ref 22.0–30.0)
CREATININE: 0.63 mg/dL (ref 0.60–1.00)
EGFR CKD-EPI AA FEMALE: >90 mL/min/{1.73_m2} (ref >=60)
EGFR CKD-EPI NON-AA FEMALE: >90 mL/min/{1.73_m2} (ref >=60)
GLUCOSE RANDOM: 102 mg/dL (ref 70–179)
POTASSIUM: 4 mmol/L (ref 3.5–5.0)
PROTEIN TOTAL: 7.2 g/dL (ref 6.5–8.3)
SODIUM: 137 mmol/L (ref 135–145)

## 2019-03-17 LAB — BLOOD UREA NITROGEN: Urea nitrogen:MCnc:Pt:Ser/Plas:Qn:: 7

## 2019-03-17 LAB — WBC ADJUSTED: Leukocytes:NCnc:Pt:Bld:Qn:: 7.6

## 2019-03-17 NOTE — Unmapped (Signed)
Port accessed.  No blood return.  Troubleshooting w/ repositioning Pt, saline & heparin flushes unsuccessful. Line heparin-locked.  Labs collected via peripheral stick.  Troubleshooting to continue at next appt w/ possible TPA.. To next appt.

## 2019-03-17 NOTE — Unmapped (Signed)
Cathy RN  in Hem/Onc clinic informed that patient needs TPA in port.

## 2019-03-20 ENCOUNTER — Ambulatory Visit: Admit: 2019-03-20 | Discharge: 2019-03-21 | Attending: Adult Health | Primary: Adult Health

## 2019-03-20 DIAGNOSIS — N912 Amenorrhea, unspecified: Principal | ICD-10-CM

## 2019-03-20 DIAGNOSIS — Z171 Estrogen receptor negative status [ER-]: Principal | ICD-10-CM

## 2019-03-20 DIAGNOSIS — C50412 Malignant neoplasm of upper-outer quadrant of left female breast: Principal | ICD-10-CM

## 2019-03-20 DIAGNOSIS — F4310 Post-traumatic stress disorder, unspecified: Principal | ICD-10-CM

## 2019-03-20 DIAGNOSIS — K209 Esophagitis, unspecified: Principal | ICD-10-CM

## 2019-03-20 LAB — BETA HCG QUANTITATIVE: Choriogonadotropin.beta subunit:ACnc:Pt:Ser/Plas:Qn:: <5

## 2019-03-20 LAB — PREGNANCY TEST URINE: Choriogonadotropin (pregnancy test):PrThr:Pt:Urine:Ord:: NEGATIVE

## 2019-03-20 MED ORDER — PANTOPRAZOLE 40 MG TABLET,DELAYED RELEASE
ORAL_TABLET | Freq: Every day | ORAL | 1 refills | 90.00000 days | Status: CP
Start: 2019-03-20 — End: 2019-04-19

## 2019-03-20 MED ORDER — SERTRALINE 100 MG TABLET
ORAL_TABLET | Freq: Every day | ORAL | 1 refills | 30 days | Status: CP
Start: 2019-03-20 — End: 2019-04-19
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
## 2019-03-20 NOTE — Unmapped (Signed)
Pt was nearly 1 hour late for appt.  Offered to see her later in afternoon when I had an opening vs reschedule.  She elected to reachedule.     Pt not seen. No charge for this visit.       [name], NP, AOCNP  [facility]  Breast Medical Oncology   [phone]

## 2019-03-20 NOTE — Unmapped (Signed)
email sent to Hansel Starling with [employer] to contact Lateisha Thurlow re joining info. Provided her cell number as requested for contact.

## 2019-03-23 NOTE — Unmapped (Signed)
Patient Sheri [name] was contacted today regarding her appointment. Unable to leave voicemail.

## 2019-04-08 NOTE — Unmapped (Signed)
[facility] Clinical Assessment & Refill Coordination Note    Sheri [name], DOB: August 12, 1995  Phone: [phone] (home)     All above HIPAA information was verified with patient.     Was a Nurse, learning disability used for this call? No    Specialty Medication(s):   Hematology/Oncology: Capecitabine 500mg , directions: 1500mg  2 times a day for 14 days on and 7 days off     Current Outpatient Medications   Medication Sig Dispense Refill   ??? acetaminophen (TYLENOL) 325 MG tablet Take 325 mg by mouth every six (6) hours as needed for pain.     ??? ARIPiprazole (ABILIFY) 2 MG tablet Take 1 tablet (2 mg total) by mouth daily. 30 tablet 1   ??? capecitabine (XELODA) 500 MG tablet Take 3 tablets (1,500 mg total) by mouth Two (2) times a day . 14 days on then 7 days off , then repeat. 84 tablet 8   ??? hydrocortisone 2.5 % cream Apply to affected area twice daily (Patient not taking: Reported on 03/20/2019) 28 g 0   ??? ibuprofen (MOTRIN) 800 MG tablet Take 1 tablet (800 mg total) by mouth every eight (8) hours as needed for pain. 70 tablet 2   ??? loperamide (IMODIUM) 2 mg capsule Take 2 mg by mouth 4 (four) times a day as needed.     ??? mometasone (ELOCON) 0.1 % cream Apply to affected area twice daily (Patient not taking: Reported on 03/20/2019) 45 g 1   ??? pantoprazole (PROTONIX) 40 MG tablet Take 1 tablet (40 mg total) by mouth daily. 90 tablet 1   ??? sertraline (ZOLOFT) 100 MG tablet Take 1 and 1/2 tablets (150 mg total) by mouth daily. 45 tablet 1   ??? silver sulfaDIAZINE (SILVADENE, SSD) 1 % cream Apply to affected open area underneath breast daily (Patient not taking: Reported on 03/20/2019) 50 g 1     No current facility-administered medications for this visit.         Changes to medications: Sheri [name] reports no changes at this time.    Allergies   Allergen Reactions   ??? Amoxicillin Other (See Comments)     EDEMA OF FACE       Changes to allergies: No    SPECIALTY MEDICATION ADHERENCE     Capecitabine 500 mg: 0 days of medicine on hand HAS BEEN OFF MEDICATION FOR AWHILE BUT RESTARTING NOW     Specialty medication(s) dose(s) confirmed: Regimen is correct and unchanged.     Are there any concerns with adherence? No    Adherence counseling provided? Not needed    CLINICAL MANAGEMENT AND INTERVENTION      Clinical Benefit Assessment:    Do you feel the medicine is effective or helping your condition? Yes    Clinical Benefit counseling provided? Not needed    Adverse Effects Assessment:    Are you experiencing any side effects? No    Are you experiencing difficulty administering your medicine? No    Quality of Life Assessment:    How many days over the past month did your condition/medication  keep you from your normal activities? For example, brushing your teeth or getting up in the morning. 0    Have you discussed this with your provider? Not needed    Therapy Appropriateness:    Is therapy appropriate? Yes, therapy is appropriate and should be continued    DISEASE/MEDICATION-SPECIFIC INFORMATION      N/A    PATIENT SPECIFIC NEEDS     ?  Does the patient have any physical, cognitive, or cultural barriers? No    ? Is the patient high risk? Yes, patient is taking oral chemotherapy. Appropriateness of therapy as been assessed.     ? Does the patient require a Care Management Plan? No     ? Does the patient require physician intervention or other additional services (i.e. nutrition, smoking cessation, social work)? No      SHIPPING     Specialty Medication(s) to be Shipped:   Hematology/Oncology: Capecitabine 500mg , directions: 1500mg  2 times a day for 14 days on and 7 days off    Other medication(s) to be shipped: n/a     Changes to insurance: No    Delivery Scheduled: Yes, Expected medication delivery date: 04/09/19.     Medication will be delivered via Same Day Courier to the confirmed prescription address in [facility].    The patient will receive a drug information handout for each medication shipped and additional FDA Medication Guides as required.  Verified that patient has previously received a Conservation officer, historic buildings.    All of the patient's questions and concerns have been addressed.    Rollen Sox   [facility] Specialty Pharmacist

## 2019-04-08 NOTE — Unmapped (Signed)
-----   Message from Otelia Limes, RN sent at 04/08/2019  1:15 PM EST -----  Regarding: RE: AYA  breast ca patients needs IUD  All,  Doesn't look like she showed up to see Dr. Providence Lanius for IUD.   Many thanks for all the efforts to try and make this happen. We will really have to keep being diligent in informing her of tremendous risk of pregnancy.   Tamela Oddi B  ----- Message -----  From: Thyra Breed, NP  Sent: 04/02/2019   4:18 PM EST  To: April Armstead Peaks, [facility], Otelia Limes, RN, #  Subject: RE: AYA  breast ca patients needs IUD            Really appreciate the follow up.  ----- Message -----  From: April Armstead Peaks, [facility]  Sent: 04/02/2019   4:11 PM EST  To: Thyra Breed, NP  Subject: FW: AYA  breast ca patients needs IUD            FYI...  ----- Message -----  From: Lanice Shirts  Sent: 04/02/2019  12:38 PM EST  To: April Armstead Peaks, [facility]  Subject: RE: AYA  breast ca patients needs IUD            Hi April,    I just spoke with patient and she is scheduled with Dr. Providence Lanius next Tuesday 2/9 at [place].  Thanks and have a good afternoon!    Morrie Sheldon  ----- Message -----  From: April Armstead Peaks, [facility]  Sent: 03/27/2019   3:16 PM EST  To: Thyra Breed, NP, #  Subject: RE: AYA  breast ca patients needs IUD            Hi Latisha,  This patient needs to be seen a.s.a.p. for ParaGard IUD insertion. Please let us know how soon we can get her in.    Thanks,  April  ----- Message -----  From: Otelia Limes, RN  Sent: 03/26/2019   1:21 PM EST  To: Thyra Breed, NP, #  Subject: AYA  breast ca patients needs IUD                April,  We have a 24 yr old young lady with high risk for recurrent cancer needing IUD ASAP. She is sexually active and noncompliant with birth control in past. We recommend a copper IUD because of her 5% estrogen positivity if possible. We have recently done urine and blood tests checking for pregnancy and Vinnie Langton has tried discussing importance of contraception, multiple times.   Would you be able to help facilitate and appointment for her or guide [place] to the best scheduling route?   She has been to [facility] before also.   Thank you.  Betsy B

## 2019-04-09 MED FILL — CAPECITABINE 500 MG TABLET: ORAL | 14 days supply | Qty: 84 | Fill #5

## 2019-04-09 MED FILL — CAPECITABINE 500 MG TABLET: 14 days supply | Qty: 84 | Fill #5 | Status: AC

## 2019-04-15 DIAGNOSIS — C50412 Malignant neoplasm of upper-outer quadrant of left female breast: Principal | ICD-10-CM

## 2019-04-15 DIAGNOSIS — Z171 Estrogen receptor negative status [ER-]: Principal | ICD-10-CM

## 2019-04-17 NOTE — Unmapped (Signed)
Attempted to reach pt as she no showed for both lab appt and visit with me today in [place].  She also recently did not go to her Gynecology appt for consideration of IUD placement.      Unable to reach her by phone; voicemail box full.      I have discussed with Dr. Avis Epley. Unfortunately, we no longer feel safe offering her oral chemotherapy with capecitabine, given that she is not keeping her appts.  I will send pt a Mychart message to make her aware that we will get her in for routine check-ups and she should stop the chemo pills (if she is still taking them).  We cannot safely prescribe them without her following up with [place].  I have asked Aimee Mongolia, our pharmacist, to help ensure we have stopped shipment of medication to pt's home.     I have also reached out to [name] with AYA program, as she has been connected with Dewanda Fennema throughout her treatment course providing support.     Will try to set-up appt with Dr. Avis Epley or myself sometime in the next 1-2 months for routine follow-up visit.        [name], NP, AOCNP  [facility]  Breast Medical Oncology   [phone]

## 2019-04-20 NOTE — Unmapped (Signed)
Adolescent and Young Adult Cancer Program Visit   Comprehensive Cancer Support Program     MOST AYA VISITS ARE BEING CONDUCTED REMOTELY VIA PHONE OR VIDEO.  THE AYA TEAM IS OFFSITE AND REMAINS AVAILABLE VIRTUALLY Monday - Thursday from 8am - 5pm. Available in-person on Fridays 8am - 5pm.    Service date: April 20, 2019    Encounter type: Virtual - text message exchange??  ??  Clinician:??[name], LCSWA  ??  Patient identifiers:??Sheri??[name]??is a 24 y.o.??with breast cancer and a history of??depression,??panic disorder,??and PTSD.??Sheri [name]??uses she/her/hers??pronouns.??    VISIT SUMMARY     Notes: Attempted to check in via text for support and to help address barriers to care. No response. Will continue to attempt contact.        I will continue to follow Sheri [name] for AYA-appropriate support. Sheri [name] has my contact information and has been encouraged to contact me as needed.     04/20/2019     [name], LCSWA  Adolescent and Young Adult Clinical Social Worker  Pager: [phone]  Available by page to conduct virtual care due to COVID-19

## 2019-04-29 DIAGNOSIS — Z171 Estrogen receptor negative status [ER-]: Principal | ICD-10-CM

## 2019-04-29 DIAGNOSIS — C50412 Malignant neoplasm of upper-outer quadrant of left female breast: Principal | ICD-10-CM

## 2019-04-29 NOTE — Unmapped (Signed)
Seeing pt next week for f/u given new complaints she reported via MyChart.     Added orders for CBC diff, CMP, and vitamin D.     [name], NP, AOCNP  [facility]  Breast Medical Oncology   [phone]

## 2019-04-29 NOTE — Unmapped (Signed)
Patient Sheri [name] was contacted today regarding scheduling an appointment. Appointment scheduled and confirmed with patient.

## 2019-05-05 ENCOUNTER — Other Ambulatory Visit: Admit: 2019-05-05 | Discharge: 2019-05-06

## 2019-05-05 ENCOUNTER — Ambulatory Visit: Admit: 2019-05-05 | Discharge: 2019-05-06 | Attending: Adult Health | Primary: Adult Health

## 2019-05-05 DIAGNOSIS — Z171 Estrogen receptor negative status [ER-]: Principal | ICD-10-CM

## 2019-05-05 DIAGNOSIS — G893 Neoplasm related pain (acute) (chronic): Principal | ICD-10-CM

## 2019-05-05 DIAGNOSIS — C50412 Malignant neoplasm of upper-outer quadrant of left female breast: Principal | ICD-10-CM

## 2019-05-05 LAB — CBC W/ AUTO DIFF
BASOPHILS ABSOLUTE COUNT: 0 10*9/L (ref 0.0–0.1)
BASOPHILS RELATIVE PERCENT: 0.2 %
EOSINOPHILS ABSOLUTE COUNT: 0.1 10*9/L (ref 0.0–0.4)
EOSINOPHILS RELATIVE PERCENT: 1.3 %
HEMATOCRIT: 34.8 % — ABNORMAL LOW (ref 36.0–46.0)
HEMOGLOBIN: 11.4 g/dL — ABNORMAL LOW (ref 12.0–16.0)
LARGE UNSTAINED CELLS: 1 % (ref 0–4)
LYMPHOCYTES ABSOLUTE COUNT: 0.7 10*9/L — ABNORMAL LOW (ref 1.5–5.0)
MEAN CORPUSCULAR HEMOGLOBIN CONC: 32.7 g/dL (ref 31.0–37.0)
MEAN CORPUSCULAR HEMOGLOBIN: 26.1 pg (ref 26.0–34.0)
MEAN CORPUSCULAR VOLUME: 79.9 fL — ABNORMAL LOW (ref 80.0–100.0)
MEAN PLATELET VOLUME: 7.3 fL (ref 7.0–10.0)
MONOCYTES ABSOLUTE COUNT: 0.4 10*9/L (ref 0.2–0.8)
MONOCYTES RELATIVE PERCENT: 5.8 %
NEUTROPHILS ABSOLUTE COUNT: 5.3 10*9/L (ref 2.0–7.5)
NEUTROPHILS RELATIVE PERCENT: 81.7 %
PLATELET COUNT: 375 10*9/L (ref 150–440)
RED BLOOD CELL COUNT: 4.35 10*12/L (ref 4.00–5.20)
RED CELL DISTRIBUTION WIDTH: 15.7 % — ABNORMAL HIGH (ref 12.0–15.0)

## 2019-05-05 LAB — COMPREHENSIVE METABOLIC PANEL
ALBUMIN: 3.8 g/dL (ref 3.5–5.0)
ALKALINE PHOSPHATASE: 236 U/L — ABNORMAL HIGH (ref 38–126)
ALT (SGPT): 9 U/L (ref <35)
ANION GAP: 10 mmol/L (ref 7–15)
AST (SGOT): 14 U/L (ref 14–38)
BLOOD UREA NITROGEN: 10 mg/dL (ref 7–21)
BUN / CREAT RATIO: 17
CALCIUM: 9.1 mg/dL (ref 8.5–10.2)
CHLORIDE: 108 mmol/L — ABNORMAL HIGH (ref 98–107)
CO2: 23 mmol/L (ref 22.0–30.0)
CREATININE: 0.6 mg/dL (ref 0.60–1.00)
EGFR CKD-EPI AA FEMALE: >90 mL/min/{1.73_m2} (ref >=60)
EGFR CKD-EPI NON-AA FEMALE: >90 mL/min/{1.73_m2} (ref >=60)
GLUCOSE RANDOM: 90 mg/dL (ref 70–179)
POTASSIUM: 4.4 mmol/L (ref 3.5–5.0)
PROTEIN TOTAL: 7.2 g/dL (ref 6.5–8.3)
SODIUM: 141 mmol/L (ref 135–145)

## 2019-05-05 LAB — CALCIUM: Calcium:MCnc:Pt:Ser/Plas:Qn:: 9.1

## 2019-05-05 LAB — PLATELET COUNT: Platelets:NCnc:Pt:Bld:Qn:Automated count: 375

## 2019-05-05 MED ORDER — METHYLPREDNISOLONE 4 MG TABLETS IN A DOSE PACK
0 refills | 0 days | Status: CP
Start: 2019-05-05 — End: ?

## 2019-05-05 MED ORDER — IBUPROFEN 800 MG TABLET
ORAL_TABLET | Freq: Three times a day (TID) | ORAL | 2 refills | 24 days | Status: CP | PRN
Start: 2019-05-05 — End: 2020-05-04

## 2019-05-05 NOTE — Unmapped (Signed)
Labs drawn and sent for analysis. Care provided by D.Sheliah Hatch RN
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
## 2019-05-06 LAB — VITAMIN D, TOTAL (25OH): Lab: 9.1 — ABNORMAL LOW

## 2019-05-06 NOTE — Unmapped (Signed)
Adolescent and Young Adult Cancer Program Visit   Comprehensive Cancer Support Program     MOST AYA VISITS ARE BEING CONDUCTED REMOTELY VIA PHONE OR VIDEO.  THE AYA TEAM IS OFFSITE AND REMAINS AVAILABLE VIRTUALLY Monday - Thursday from 8am - 5pm. Available in-person on Fridays 8am - 5pm.    Service date: May 06, 2019    Encounter type: Virtual - text message exchange??  ??  Clinician:??[name], LCSWA  ??  Patient identifiers:??Sheri [name]??Sheri [name]??is a 24 y.o.??with breast cancer and a history of??depression,??panic disorder,??and PTSD.??Cherri Yera??uses she/her/hers??pronouns.??    VISIT SUMMARY     Notes: Received text from Korianna Washer in response to my message on 2/22. She stated that she is ok but also not doing all that well and would like to catch up. Responded and offered times today to talk. No response.      Plan: Will check in next week if no response received.    I will continue to follow Jakaria for AYA-appropriate support. Zarahi has my contact information and has been encouraged to contact me as needed.     05/06/2019     [name], LCSWA  Adolescent and Young Adult Clinical Social Worker  Pager: [phone]  Available by page to conduct virtual care due to COVID-19

## 2019-05-06 NOTE — Unmapped (Signed)
Adolescent and Young Adult Cancer Program Visit   Comprehensive Cancer Support Program     MOST AYA VISITS ARE BEING CONDUCTED REMOTELY VIA PHONE OR VIDEO.  THE AYA TEAM IS OFFSITE AND REMAINS AVAILABLE VIRTUALLY Monday - Thursday from 8am - 5pm. Available in-person on Fridays 8am - 5pm.    Service date: May 06, 2019    Encounter type: Virtual - text message exchange??  ??  Clinician:??[name], LCSWA  ??  Patient identifiers:??Sheri [name]??Sheri [name]??is a 24 y.o.??with breast cancer and a history of??depression,??panic disorder,??and PTSD.??Sheri [name]??uses she/her/hers??pronouns.??    VISIT SUMMARY     Notes: Sent message to follow up on offer for support. Received reply later in the day stating she would like to talk on 3/11. Offered times, no reply.        Plan: Will check in again.    I will continue to follow Sheri [name] for AYA-appropriate support. Sheri [name] has my contact information and has been encouraged to contact me as needed.     05/06/2019     [name], LCSWA  Adolescent and Young Adult Clinical Social Worker  Pager: [phone]  Available by page to conduct virtual care due to COVID-19

## 2019-05-06 NOTE — Unmapped (Signed)
Patient Sheri [name] was contacted today regarding her 3.15 and 3.30 appointments. Confirmed with patient.

## 2019-05-07 DIAGNOSIS — E559 Vitamin D deficiency, unspecified: Principal | ICD-10-CM

## 2019-05-07 DIAGNOSIS — C50412 Malignant neoplasm of upper-outer quadrant of left female breast: Principal | ICD-10-CM

## 2019-05-07 DIAGNOSIS — Z171 Estrogen receptor negative status [ER-]: Principal | ICD-10-CM

## 2019-05-07 MED ORDER — ERGOCALCIFEROL (VITAMIN D2) 1,250 MCG (50,000 UNIT) CAPSULE
ORAL_CAPSULE | ORAL | 3 refills | 28 days | Status: CP
Start: 2019-05-07 — End: 2020-05-06

## 2019-05-07 NOTE — Unmapped (Signed)
Vitamin D results low at 9.1.     Will start vitamin D 50,000 units weekly.  Rx sent to pt's local pharmacy and I have made her aware to pick up medication and start it (on results release in MyChart).     Can plan to recheck vitamin D level in 8-12 weeks.     [name], NP, AOCNP  [facility]  Breast Medical Oncology   [phone]

## 2019-05-10 NOTE — Unmapped (Signed)
No show  This encounter was created in error - please disregard.

## 2019-05-20 ENCOUNTER — Ambulatory Visit: Admit: 2019-05-20 | Discharge: 2019-06-02

## 2019-05-21 NOTE — Unmapped (Signed)
error 

## 2019-05-26 NOTE — Unmapped (Signed)
Noticed that Sheri [name] missed her recent bone scan that was scheduled/rescheduled, so I called her to check on her (skipping our virtual visit because I do not have scan results to share).     She did answer the phone and shared with me that she has been having a really hard time with anxiety/depression/panic attacks in recent days. The weekend was really really bad. Endorses some thoughts of suicide.     We talk about safety plan. She's feeling a bit better now, but still struggling with her mood. Notes that she reached out and is scheduled with Sheri Amos, NP with [facility] in a couple of weeks. I asked her if she felt like she could wait that long and she said maybe.  Told her I would reach out to [facility] to see if it is possible to see her sooner.  Advised Sheri [name] that she should report to her closest ER and/or give someone a call if she feels like she is suicidal in future and needs immediate assistance, as we care a lot about her and her well-being.     Will also reach out to [facility], LCSW to make her aware, as she has been very involved in this patient's care.     Will make Dr. Avis [name] and rest of team aware.     Have asked Sheri [name] to help get Sheri [name] bone scan rescheduled to sometime in the next couple of weeks and add back on a virtual visit with me after scan to follow-up.     Sheri [name] was very grateful for call.  ( I encouraged her to clean out her voicemail box and include a message with her full name so we could leave detailed messages for her; she said she would do it).         Sheri Basque, NP, AOCNP  [facility]  Breast Medical Oncology   [phone]

## 2019-05-26 NOTE — Unmapped (Signed)
Adolescent and Young Adult Cancer Program Visit   Comprehensive Cancer Support Program     THE AYA TEAM IS OFFSITE AND REMAINS AVAILABLE VIRTUALLY Monday and Wednesday from 8am - 5pm. Available in-person on Tuesdays, Thursdays, and Fridays 8am - 5pm.    Service date: May 26, 2019    Encounter type: Virtual - text message exchange??  ??  Clinician:??[name], LCSWA  ??  Patient identifiers:??Sheri [name]??Sheri [name]??is a 24 y.o.??with breast cancer and a history of??depression,??panic disorder,??and PTSD.??Sheri [name]??uses she/her/hers??pronouns.??    VISIT SUMMARY     Notes: Scheduled via text to talk with Sheri [name] for counseling on 3/31 at 1pm.         I will continue to follow Sheri [name] for AYA-appropriate support. Sheri [name] has my contact information and has been encouraged to contact me as needed.     05/26/2019     [name], Theresia Majors  Adolescent and Young Adult Clinical Social Worker  Pager: [phone]
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
## 2019-05-28 MED ORDER — SERTRALINE 100 MG TABLET
ORAL_TABLET | Freq: Every day | ORAL | 11 refills | 30 days | Status: CP
Start: 2019-05-28 — End: 2020-05-27

## 2019-05-28 NOTE — Unmapped (Signed)
It was very nice meeting with you this morning.     Here is our plan:    (1) continue sertraline (Zoloft) 200 mg daily  (2) When you are ready, we can wean off of the Zoloft and start Cymbalta. We decided today that it would be best to make this change when your life feels a little more stable.     Please let me know if you have any questions or concerns.    Best,  [name]    Comprehensive Cancer Support Program (CCSP) - Psychiatry Outpatient Clinic   After Visit Summary    It was a pleasure to see you today in the [facility]s Comprehensive Cancer Support Program (CCSP). The CCSP is a multidisciplinary program dedicated to helping patients, caregivers, and families with cancer treatment, recovery and survivorship.      To schedule, cancel, or change your appointment:  Please call the [facility] schedulers at [phone], Monday through Friday 8AM - 5PM.  Someone will return your call within 24 hours.      If you have a question about your medicines or you need to contact your provider:  Please call the CCSP program coordinator, Myrene Galas, at [phone].     For after hours urgent issues, you may call [phone] or call the I need to talk line at 1-800-273-TALK (8255) anytime 24/7.    CCSP Patient and Family Resource Center: [phone].    CCSP Website:  [email or web address]    For prescription refills, please allow at least 24 hours (during business hours, M-F) for providers to call in refills to your pharmacy. We are generally unable to accommodate same-day requests for refills.     If you are taking any controlled substances (such as anxiety or sleep medications), you must use them as the directions say to use them. We generally do not provide early refills over the phone without clear reason, and it would be inappropriate to obtain the medications from other doctors. We routinely use the [place] controlled substance database to monitor prescription drug use.
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
## 2019-06-02 NOTE — Unmapped (Signed)
Adolescent and Young Adult Cancer Program Visit   Comprehensive Cancer Support Program     THE AYA TEAM IS OFFSITE AND REMAINS AVAILABLE VIRTUALLY Monday and Wednesday from 8am - 5pm. Available in-person on Tuesdays, Thursdays, and Fridays 8am - 5pm.    Service date: June 01, 2019    Encounter type: Virtual - telephone call and text message exchange??  ??  Clinician:??[name], LCSWA  ??  Patient identifiers:??Sheri [name]??[name]??is a 24 y.o.??with breast cancer and a history of??depression,??panic disorder,??and PTSD.??[name]??uses she/her/hers??pronouns.??  ??  VISIT SUMMARY   ??  Notes: Attempted scheduled counseling session via phone. No response; mailbox full. Sent text message asking if she needs to reschedule.         Plan: Will check in later this week if not response received.    I will continue to follow Sheri [name] for AYA-appropriate support. Sheri [name] has my contact information and has been encouraged to contact me as needed.     06/01/2019     [name], Theresia Majors  Adolescent and Young Adult Clinical Social Worker  Pager: [phone]

## 2019-06-05 NOTE — Unmapped (Signed)
LDVM with my callback number. We would like to reschedule her missed appointment with Dr. Dellis Anes from 3/15. She can come any Monday or Wednesday in the next one - two weeks.

## 2019-06-10 NOTE — Unmapped (Signed)
Adolescent and Young Adult Cancer Program Visit   Comprehensive Cancer Support Program     THE AYA TEAM IS OFFSITE AND REMAINS AVAILABLE VIRTUALLY Monday and Wednesday from 8am - 5pm. Available in-person on Tuesdays, Thursdays, and Fridays 8am - 5pm.    Service date: June 10, 2019    Encounter type: Virtual - text message exchange??  ??  Clinician:??[name], LCSWA  ??  Patient identifiers:??Sheri??[name]??is a 24 y.o.??with breast cancer and a history of??depression,??panic disorder,??and PTSD.??Amanie Mcculley??uses she/her/hers??pronouns.??    VISIT SUMMARY     Notes: Sent text message to Christia Domke to check in after missed counseling appointment last week. No response yet; will continue to try to reach.      I will continue to follow [place] for AYA-appropriate support. [facility] has my contact information and has been encouraged to contact me as needed.     06/10/2019     [name], Theresia Majors  Adolescent and Young Adult Clinical Social Worker  Pager: [phone]

## 2019-06-12 ENCOUNTER — Ambulatory Visit: Admit: 2019-06-12 | Discharge: 2019-06-16

## 2019-06-16 ENCOUNTER — Telehealth: Admit: 2019-06-16 | Discharge: 2019-06-17 | Attending: Adult Health | Primary: Adult Health

## 2019-06-16 DIAGNOSIS — Z171 Estrogen receptor negative status [ER-]: Principal | ICD-10-CM

## 2019-06-16 DIAGNOSIS — M25561 Pain in right knee: Principal | ICD-10-CM

## 2019-06-16 DIAGNOSIS — M25562 Pain in left knee: Secondary | ICD-10-CM

## 2019-06-16 DIAGNOSIS — C50412 Malignant neoplasm of upper-outer quadrant of left female breast: Principal | ICD-10-CM

## 2019-06-16 MED ORDER — DICLOFENAC 1 % TOPICAL GEL
Freq: Four times a day (QID) | TOPICAL | 2 refills | 13 days | Status: CP
Start: 2019-06-16 — End: 2020-06-15
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
## 2019-06-16 NOTE — Unmapped (Signed)
Adolescent and Young Adult Cancer Program Visit   Comprehensive Cancer Support Program     THE AYA TEAM IS OFFSITE AND REMAINS AVAILABLE VIRTUALLY Monday and Wednesday from 8am - 5pm. Available in-person on Tuesdays, Thursdays, and Fridays 8am - 5pm.    Service date: June 15, 2019    Clinician: [name], LCSWA  ??  Patient identifiers:??Sheri [name]??Sheri [name]??is a 24 y.o.??with breast cancer and a history of??depression,??panic disorder,??and PTSD.??Sheri [name]??uses she/her/hers??pronouns.??    VISIT SUMMARY     Notes: Via text, scheduled telephone call today for support. Spoke with Sheri [name] on the phone at planned time for over an hour.    Sheri [name] shared that she has been experiencing near-constant anxiety with frequent panic attacks. Has been managing by taking Xanax, unprescribed, and would like to transition off of this and perhaps onto another medication that would more stably manage increasing panic symptoms. She does not report thoughts of suicide and was clear that use of Xanax is to control significant anxiety, with no desire to use this or other prescriptions recreationally. Discussed contributing stressors and barriers to care.    Connected with Sheri [name], who will schedule to see Sheri [name] later this week for supportive med management and discussion of a plan for managing anxiety.    Discussed safety plan with Sheri [name] around anxiety/panic, potentially unsafe home situation, and her concerns about reaction/withrdrawal from medication. Encouraged her to call 911 or go to the emergency room if needed in any of these situations.    Plan: Will check in tomorrow.    I will continue to follow Sheri [name] for AYA-appropriate support. Sheri [name] has my contact information and has been encouraged to contact me as needed.     06/15/2019     [name], Theresia Majors  Adolescent and Young Adult Clinical Social Worker  Pager: [phone]

## 2019-06-16 NOTE — Unmapped (Signed)
Adolescent and Young Adult Cancer Program Visit   Comprehensive Cancer Support Program     THE AYA TEAM IS OFFSITE AND REMAINS AVAILABLE VIRTUALLY Monday and Wednesday from 8am - 5pm. Available in-person on Tuesdays, Thursdays, and Fridays 8am - 5pm.    Service date: June 16, 2019    Encounter type: Telephone Call    Clinician: [name], LCSWA  ??  Patient identifiers:??Sheri [name]??Sheri [name]??is a 24 y.o.??with breast cancer and a history of??depression,??panic disorder,??and PTSD.??Sheri [name]??uses she/her/hers??pronouns.??    VISIT SUMMARY     Notes: Follow up supportive call. Sheri [name] reports improvement in mood today, attributed in part to spending the day with her friend Sheri [name]. They are considering moving in together in the near future. Offered support in realizing this plan if needed. She plans to attend video visit with Sheri [name] tomorrow for medication support.      Plan: Will check in next week.    I will continue to follow [name] for AYA-appropriate support. [name] has my contact information and has been encouraged to contact me as needed.     06/16/2019     [name], Theresia Majors  Adolescent and Young Adult Clinical Social Worker  Pager: [phone]

## 2019-06-16 NOTE — Unmapped (Signed)
Pt confirmed VIDEO visit and meds  Pt is at home and Provider is ON campus

## 2019-06-17 NOTE — Unmapped (Signed)
I spoke with patient Sheri [name] to confirm appointments on the following date(s): 06/22/2019    Sheri [name]

## 2019-06-19 ENCOUNTER — Ambulatory Visit: Admit: 2019-06-19 | Discharge: 2019-06-20

## 2019-06-19 ENCOUNTER — Ambulatory Visit: Admit: 2019-06-19 | Discharge: 2019-06-20 | Attending: Internal Medicine | Primary: Internal Medicine

## 2019-06-19 DIAGNOSIS — M222X2 Patellofemoral disorders, left knee: Principal | ICD-10-CM

## 2019-06-19 DIAGNOSIS — M1711 Unilateral primary osteoarthritis, right knee: Principal | ICD-10-CM

## 2019-06-19 DIAGNOSIS — M25562 Pain in left knee: Secondary | ICD-10-CM

## 2019-06-19 DIAGNOSIS — M222X1 Patellofemoral disorders, right knee: Secondary | ICD-10-CM

## 2019-06-19 DIAGNOSIS — M25561 Pain in right knee: Principal | ICD-10-CM

## 2019-06-19 NOTE — Unmapped (Signed)
Thank you for coming to the [facility] and our clinic today!     We aim to provide you with the highest quality care.  If you need to schedule future appointments please call [phone].  If you need more direct help feel free to call our nurse line at [phone] or use MyChart to reach out to [place].    We look forward to seeing you again in the future and appreciate you coming to [facility].    Thank you.    Saunders Glance. Enis Slipper, MD            We provide innovative and comprehensive patient centered care that is supported by evidence-based research                                                                                                    COMING SOON!    Please check out our current research studies to see if you or someone you know may qualify at:    [email or web address]    .

## 2019-06-19 NOTE — Unmapped (Signed)
Patient Name: Sheri [name]  Patient Age: 24 y.o.  Encounter Date: 06/22/2019    Referring Physician:   None Per Patient Referring  No address on file    Primary Care Provider:  No PCP Per Patient    Breast Cancer Treatment Team:  Surgical Oncology: [name], DO  Surgical Oncology NP: Lowry Bowl, NP and Pollyann Samples, NP  Radiation Oncology: Rogelio Seen, MD  Medical Oncology: Marc Morgans, MD  Plastic Surgery: Lamarr Lulas, MD  Genetics: Lesia Hausen, [facility]    Reason for Visit: Breast cancer surveillance visit    HPI:  Sheri [name] is a 24 y.o. female who presents for followup after breast cancer surgery. Below is a synopsis of past and present health information pertinent to her breast cancer diagnosis, treatment and current health status. She is accompanied today by her boyfriend, Sheri [name] who also contributes to the history. Sheri [name] was last seen in clinic on 12/15/18. Since that visit she endorses axillary tightness and tenderness with mild limited ROM but otherwise denies associated symptoms including pain, redness, fever, chills, breast mass, breast retraction, nipple inversion, breast swelling, nipple discharge, nausea, vomiting or weight loss. She shares her pain started after completion of radiation. She tells me she is in a good place today and is cheerful and smiling. She tells me everything is good right now.    Historically, Sheri [name] presented with a left breast IDC (+/-/-) discovered on self exam. She completed neoadjuvant chemotherapy, and underwent left skin-sparing??mastectomy and??left??sentinel lymph node biopsy with tissue expander placement, performed on??05/30/2018.??Upon TE exchange to implant??(7/29), a suspicious skin lesion was noticed and biopsied w/ pathology showing disease recurrence necessisating chest wall excision (10/23/18). She completed radiation treatment under the direction of Dr. Baird Lyons on 01/2019. Attempted to resume Capecitabine after radiation, but she was lost to follow up with intermittent compliance due in part to difficult social situation. After a balanced discussion with Dr. Avis Epley, it was decided that the risks of Sheri [name] taking Capecitabine without adequate follow up as well as risk for pregnancy while on Capecitabine was too great, and the decision was made to stop this specific treatment. They did discuss the option of endocrine therapy, though decided to forgo ET with tamoxifen given she has had intermittent adherence to medical appts and is teratogenic (missed appts to get IUD placement).    Diagnosis:    1. Left breast infiltrating ductal carcinoma  Cancer Staging  Malignant neoplasm of upper-outer quadrant of left breast in female, estrogen receptor negative (CMS-HCC)  Staging form: Breast, AJCC 8th Edition  - Clinical stage from 12/10/2017: Stage IIB (cT2, cN0(f), cM0, G3, ER+, PR-, HER2-) - Signed by Talbert Cage, DO on 12/10/2017  - Pathologic stage from 06/09/2018: No Stage Recommended (ypT1b, pN0(sn), cM0, G2, ER+, PR-, HER2-) - Signed by Rudie Meyer, ANP on 06/09/2018  - Pathologic stage from 11/12/2018: Stage IB (rpT1b, pN0, cM0, G3, ER-, PR-, HER2-) - Signed by Talbert Cage, DO on 11/13/2018        Procedure:   1. Left Left chest wall excision of local recurrence at biopsy site, performed on??10/23/2018.  2. Left??skin-sparing mastectomy and??left??sentinel lymph node biopsy with tissue expander placement, performed on??05/30/2018.    Clinical Trial Participant:   1. No clinical trials at this time.    Medical Oncology: Neoadjuvant Chemotherapy and Endocrine Therapy with Marc Morgans, MD    Radiation Oncology: whole breast irradiation with Rogelio Seen, MD completed 01/2019    Genetics Evaluation: Genetic testing negative  for mutation (12/2017)    Review of Systems:  10 organ systems reviewed and pertinent as noted in HPI.       Medical history reviewed as below. I have also reviewed additional records as provided.  Oncology History Overview Note   Left breast cT2 N0 IDC, G3, weakly ER+, PR-, HER-     Malignant neoplasm of upper-outer quadrant of left breast in female, estrogen receptor negative (CMS-HCC)   12/03/2017 -  Presenting Symptoms    Presented with self detected left breast mass x 3 weeks.  MMG/US: Left breast irregular 2.7 x 3.2 cm mass. There are faint microcalcifications associated with the mass and mild surrounding trabecular thickening is suggestive of edema.  There are 2 hyperdense and enlarged left axillary lymph nodes. No other suspicious findings are seen in the left breast.     12/03/2017 -  Other    Clinic exam: Left breast in the 1 to 2 o'clock position there is a 4 cm irregular mass. Subtle pink erythema without pitting. Left axilla with a palpable 2 cm lymph node.     12/03/2017 Biopsy    Left breast USG core biopsy: IDC, G3, ER+(5%), PR-(0), HER2-(0)  Left axillary USG core biopsy: fragments of LN negative for carcinoma     12/10/2017 Initial Diagnosis    Malignant neoplasm of upper-outer quadrant of left breast in female, estrogen receptor negative (CMS-HCC)     12/10/2017 Tumor Board    MDC recs: Left breast cT2 N0 IDC, G3, +/-/-. Very suspicious LN on imaging and exam. LN core biopsy negative. Discussed considering repeat core (although needle is definitively in the node) vs outback SLN. Will receive NACT. If node neg, plan for outback SLN. Node positivity would affect radiation recs if patient has mastectomy. Meeting genetics today. Will discuss fertility. Also discussed concerns for XRT in young patient in 20's - higher risk of complications from XRT long-term. Consider staging studies.     12/10/2017 -  Cancer Staged    Staging form: Breast, AJCC 8th Edition  - Clinical stage from 12/10/2017: Stage IIB (cT2, cN0(f), cM0, G3, ER+, PR-, HER2-) - Signed by Talbert Cage, DO on 12/10/2017       12/11/2017 Biopsy    Repeat left axillary core biopsy given clinical suspicion: LN negative for carcinoma 12/24/2017 - 02/17/2018 Chemotherapy    OP BREAST AC (DOSE DENSE)  DOXOrubicin 60 mg/m2, Cyclophosphamide 600 mg/m2 every 14 days     01/08/2018 Genetics    Patient has genetic testing done for breast cancer.  Negative [employer] Breast Cancer Guidelines-Based Panel.     02/25/2018 - 05/19/2018 Chemotherapy    OP BREAST PACLItaxel WEEKLY / CARBOplatin EVERY 3 WEEKS  PACLItaxel 80 mg/m2 IV weekly   CARBOplatin AUC 6 IV every 3 weeks   21-day cycle x 4 cycles      05/30/2018 Surgery    Left SSM: Residual IDC, Gr 2 +/-/- measuring 8mm.  Final margins are clear.  0/4 SLN with metastatic disease  Residual Cancer Burden   Tumor bed size: 8 mm x 7 mm  Overall tumor cellularity: 40%  Percentage in situ: 0%  Number of involved lymph nodes: 0  Diameter of largest metastasis: 0 mm  Residual Cancer Burden Score: 1.687  Residual Cancer Burden Class: RCB-II     06/09/2018 -  Cancer Staged    Staging form: Breast, AJCC 8th Edition  - Pathologic stage from 06/09/2018: No Stage Recommended (ypT1b, pN0(sn), cM0, G2, ER+, PR-, HER2-) - Signed  by Rudie Meyer, ANP on 06/09/2018       10/23/2018 Surgery    Left chest wall excision by Dr. Dellis Anes. Final Path: IDC Grade 3 ER-PR- HER2- measuring 1 cm in greatest dimension. Final Margins clear.      11/12/2018 -  Cancer Staged    Staging form: Breast, AJCC 8th Edition  - Pathologic stage from 11/12/2018: Stage IB (rpT1b, pN0, cM0, G3, ER-, PR-, HER2-) - Signed by Talbert Cage, DO on 11/13/2018         Past Medical History:   Diagnosis Date   ??? Anxiety    ??? Depression    ??? Malignant neoplasm of upper-outer quadrant of left breast in female, estrogen receptor negative (CMS-HCC) 12/10/2017   ??? Panic disorder    ??? PTSD (post-traumatic stress disorder)      Past Surgical History:   Procedure Laterality Date   ??? AUGMENTATION MAMMAPLASTY Left 08/2018    left only    ??? BREAST BIOPSY Left 2019    malignant   ??? CHEMOTHERAPY Left 11/2017    May 13 2018   ??? IR INSERT PORT AGE GREATER THAN 5 YRS  12/18/2017    IR INSERT PORT AGE GREATER THAN 5 YRS 12/18/2017 Sheri Fudge, MD IMG VIR [facility]   ??? MASTECTOMY Left 05/2018   ??? PR BX/REMV,LYMPH NODE,DEEP AXILL Left 05/30/2018    Procedure: BX/EXC LYMPH NODE; OPEN, DEEP AXILRY NODE;  Surgeon: Talbert Cage, DO;  Location: ASC OR [facility];  Service: Surgical Oncology Breast   ??? PR EXC TUMOR SOFT TISSUE NECK/ANT THORAX SUBQ 3+CM Right 10/23/2018    Procedure: Priority EXCISION, TUMOR, SOFT TISSUE OF NECK OR ANTERIOR THORAX, SUBCUTANEOUS; 3 CM OR GREATER;  Surgeon: Talbert Cage, DO;  [facility];  Service: Surgical Oncology Breast   ??? PR IMPLNT BIO IMPLNT FOR SOFT TISSUE REINFORCEMENT Left 05/30/2018    Procedure: IMPLANTATION BIOLOGIC IMPLANT(EG, ACELLULAR DERMAL MATRIX) FOR SOFT TISSUE REINFORCEMENT(EG, BREAST, TRUNK);  Surgeon: Arsenio Katz, MD;  Location: ASC OR [facility];  Service: Plastics   ??? PR INSERTION BREAST IMPLANT SAME DAY OF MASTECTOMY Left 05/30/2018    Procedure: IMMED INSRT BREAST PROSTH AFTER MASTOPEX/MASTECT;  Surgeon: Arsenio Katz, MD;  Location: ASC OR [facility];  Service: Plastics   ??? PR INTRAOPERATIVE SENTINEL LYMPH NODE ID W DYE INJECTION Left 05/30/2018    Procedure: INTRAOPERATIVE IDENTIFICATION SENTINEL LYMPH NODE(S) INCLUDE INJECTION NON-RADIOACTIVE DYE, WHEN PERFORMED;  Surgeon: Talbert Cage, DO;  Location: ASC OR [facility];  Service: Surgical Oncology Breast   ??? PR MASTECTOMY, SIMPLE, COMPLETE Left 05/30/2018    Procedure: Urgent MASTECTOMY, SIMPLE, COMPLETE;  Surgeon: Talbert Cage, DO;  Location: ASC OR [facility];  Service: Surgical Oncology Breast   ??? PR RECMPL WND TRUNK 2.6-7.5 CM Left 10/23/2018    Procedure: REPR COMPLX TRUNK; 2.6 CM TO 7.5 CM;  Surgeon: Arsenio Katz, MD;  Location: ASC OR [facility];  Service: Plastics   ??? PR REPLACEMENT TISSUE EXPANDER W/PERMANENT IMPLANT Left 09/24/2018    Procedure: REPLACEMENT OF TISSUE EXPANDER WITH PERMANENT PROSTHESIS;  Surgeon: Arsenio Katz, MD;  Location: MAIN OR [facility];  Service: Plastics   ??? PR REVISION OF RECONSTRUCTED BREAST Left 09/24/2018    Procedure: R21 - REVISION OF RECONSTRUCTED  BREAST;  Surgeon: Arsenio Katz, MD;  Location: MAIN OR [facility];  Service: Plastics   ??? PR SUSPENSION OF BREAST Right 09/24/2018    Procedure: MASTOPEXY FOR SYMMETRY PURPOSES;  Surgeon: Arsenio Katz,  MD;  Location: MAIN OR ;  Service: Plastics   ??? PR TISSUE EXPANDER PLACEMENT BREAST RECONSTRUCTION Left 05/30/2018    Procedure: BREAST RECON IMMED/DELAY W/EXPANDR W/SUBSQT EXPA;  Surgeon: Arsenio Katz, MD;  Location: ASC OR [facility];  Service: Plastics     Family History   Problem Relation Age of Onset   ??? Skin cancer Mother    ??? Breast cancer Neg Hx    ??? Colon cancer Neg Hx    ??? Endometrial cancer Neg Hx    ??? Ovarian cancer Neg Hx      Social History     Socioeconomic History   ??? Marital status: Single     Spouse name: Not on file   ??? Number of children: Not on file   ??? Years of education: Not on file   ??? Highest education level: Not on file   Occupational History   ??? Not on file   Tobacco Use   ??? Smoking status: Never Smoker   ??? Smokeless tobacco: Never Used   Vaping Use   ??? Vaping Use: Never used   Substance and Sexual Activity   ??? Alcohol use: Not Currently   ??? Drug use: Never   ??? Sexual activity: Not on file   Other Topics Concern   ??? Not on file   Social History Narrative    The patient is single.  She lives at home with her mother and brother     Social Determinants of Psychologist, prison and probation services Strain:    ??? Difficulty of Paying Living Expenses:    Food Insecurity:    ??? Worried About Programme researcher, broadcasting/film/video in the Last Year:    ??? Barista in the Last Year:    Transportation Needs:    ??? Freight forwarder (Medical):    ??? Lack of Transportation (Non-Medical):    Physical Activity:    ??? Days of Exercise per Week:    ??? Minutes of Exercise per Session:    Stress:    ??? Feeling of Stress :    Social Connections:    ??? Frequency of Communication with Friends and Family:    ??? Frequency of Social Gatherings with Friends and Family:    ??? Attends Religious Services:    ??? Database administrator or Organizations:    ??? Attends Banker Meetings:    ??? Marital Status:        Current Outpatient Medications:   ???  acetaminophen (TYLENOL) 325 MG tablet, Take 325 mg by mouth every six (6) hours as needed for pain., Disp: , Rfl:   ???  diclofenac sodium (VOLTAREN) 1 % gel, Apply 2 g topically Four (4) times a day., Disp: 100 g, Rfl: 2  ???  ergocalciferol-1,250 mcg, 50,000 unit, (VITAMIN D2-1,250 MCG, 50,000 UNIT,) 1,250 mcg (50,000 unit) capsule, Take 1 capsule (1,250 mcg total) by mouth once a week., Disp: 4 capsule, Rfl: 3  ???  hydrocortisone 2.5 % cream, Apply to affected area twice daily, Disp: 28 g, Rfl: 0  ???  ibuprofen (MOTRIN) 800 MG tablet, Take 1 tablet (800 mg total) by mouth every eight (8) hours as needed for pain., Disp: 70 tablet, Rfl: 2  ???  loperamide (IMODIUM) 2 mg capsule, Take 2 mg by mouth 4 (four) times a day as needed., Disp: , Rfl:   ???  methylPREDNISolone (MEDROL DOSEPACK) 4 mg tablet, follow package directions, Disp: 1 each, Rfl: 0  ???  mometasone (ELOCON) 0.1 % cream, Apply to affected area twice daily, Disp: 45 g, Rfl: 1  ???  pantoprazole (PROTONIX) 40 MG tablet, Take 40 mg by mouth daily., Disp: , Rfl:   ???  pentoxifylline (TRENTAL) 400 mg CR tablet, Take 1 tablet (400 mg total) by mouth Three (3) times a day with a meal., Disp: 90 tablet, Rfl: 11  ???  sertraline (ZOLOFT) 100 MG tablet, Take 2 tablets (200 mg total) by mouth daily., Disp: 60 tablet, Rfl: 11  ???  silver sulfaDIAZINE (SILVADENE, SSD) 1 % cream, Apply to affected open area underneath breast daily, Disp: 50 g, Rfl: 1  Allergies   Allergen Reactions   ??? Amoxicillin Other (See Comments)     EDEMA OF FACE       Reproductive History:  Nulliparous. Menarche reported at age 24. LMP No LMP recorded. OCPs no. HRT no.    Physical Examination:   Blood pressure 140/77, pulse 98, temperature 35.5 ??C (95.9 ??F), temperature source Temporal, resp. rate 18, height 167.6 cm (5' 6), weight (!) 140.3 kg (309 lb 3.2 oz), SpO2 100 %, not currently breastfeeding.  General Appearance:  No acute distress, well appearing and well nourished.   Head:  Normocephalic, atraumatic.   Eyes:  Conjuctiva and lids appear normal. Pupils equal and round,   sclera anicteric.   Ears:  Overall appearance normal with no scars, lesions or               masses.  Hearing is grossly normal.   Nose: Not evaluated today. Patient was wearing a mask during the visit due to COVID19 recommendations.   Throat: Not evaluated today. Patient was wearing a mask during the visit due to COVID19 recommendations.   Neck: Supple, symmetrical, trachea midline, no adenopathy;   thyroid: no enlargement/tenderness/nodules.   Pulmonary:    Normal respiratory effort. Lungs were clear to auscultation  bilaterally.   Cardiovascular:  Regular rate and rhythm, no murmur noted. No thrill noted.   Abdomen:   Soft, non-tender, without masses. No                                      hepatosplenomegaly. No hernias appreciated. Normoactive bowel sounds.   Musculoskeletal: Normal gait. Extremities without clubbing, cyanosis, or           edema.   Skin: Skin color, texture, turgor normal, no rashes or lesions.   Neurologic: No motor abnormalities noted. Sensation grossly intact.   Lymphatic:  Breast: No cervical or supraclavicular LAD noted.   A comprehensive examination of the breasts and chest wall was performed with the patient upright and supine with arms at her sides and above her head.   Left breast well healed scar at the left breast inframmamary fold. Nipple everted. normal in size, normal symmetry, normal contour, no dimpling, no skin changes, nipple everted, nipple exhibits no crusting or excoriation, no masses or nodules, no palpable axillary adenopathy, infraclavicular and supraclavicular nodal examinations are negative. Cosmetic outcome is good.  Right breast normal in size, normal symmetry, normal contour, no dimpling, no skin changes, nipple everted, nipple exhibits no crusting or excoriation, no masses or nodules, no palpable axillary adenopathy.   Psychiatric: Judgement and insight appropriate. Oriented to person,         place, and time.       Imaging Review:  Not indicated s/p mastectomy. Due for right MMG  in 09/2019    Impression:  Sheri [name] is a 24 y.o. female with a history of left breast infiltrating ductal carcinoma, estrogen receptor negative, progesterone receptor negative and HER2 negative. See treatment summary above.   Staging  Cancer Staging  Malignant neoplasm of upper-outer quadrant of left breast in female, estrogen receptor negative (CMS-HCC)  Staging form: Breast, AJCC 8th Edition  - Clinical stage from 12/10/2017: Stage IIB (cT2, cN0(f), cM0, G3, ER+, PR-, HER2-) - Signed by Talbert Cage, DO on 12/10/2017  - Pathologic stage from 06/09/2018: No Stage Recommended (ypT1b, pN0(sn), cM0, G2, ER+, PR-, HER2-) - Signed by Rudie Meyer, ANP on 06/09/2018  - Pathologic stage from 11/12/2018: Stage IB (rpT1b, pN0, cM0, G3, ER-, PR-, HER2-) - Signed by Talbert Cage, DO on 11/13/2018    There is no evidence of disease at 8 months.    Recommendations:  1. Continue surveillance as outlined. NCCN recommendations include having a primary care provider for all general health care, including cancer screening tests. Visits with medical oncology should occur every 3 to 6 months for the first 3 years, every 6 to 12 months for years 4 and 5, and annually thereafter. Visits with surgical oncology and radiation oncology should be rotated with medical oncology initially until the patient is released to medical oncology or a survivorship clinic. The surgery visit should typically be paired with the imaging appointment so that we can review mammogram findings and recommendations.  2. Return to clinic in 4 months with diagnostic imaging as recommended above.   3. Discussed option to try Trental for radiation-related chest wall pain. I recommend taking Vitamin E, Vitamin B6, and Trental for possible pain relief. I provided a prescription for Trental today.  4. She is relieved that there isn't anything concerning on exam and was worried about the pain. She is due for right mammogram in 4 months and I will reassess her left exam and pain at that time for short interval follow up.    All of the patient's and family's questions and concerns were addressed during the visit and they are in agreement with the plan of care. Please do not hesitate to contact me with questions or concerns.    Cancer Staging  Malignant neoplasm of upper-outer quadrant of left breast in female, estrogen receptor negative (CMS-HCC)  Staging form: Breast, AJCC 8th Edition  - Clinical stage from 12/10/2017: Stage IIB (cT2, cN0(f), cM0, G3, ER+, PR-, HER2-) - Signed by Talbert Cage, DO on 12/10/2017  - Pathologic stage from 06/09/2018: No Stage Recommended (ypT1b, pN0(sn), cM0, G2, ER+, PR-, HER2-) - Signed by Rudie Meyer, ANP on 06/09/2018  Staging comments: Residual Cancer Burden   Tumor bed size: 8 mm x 7 mm  Overall tumor cellularity: 40%  Percentage in situ: 0%  Number of involved lymph nodes: 0  Diameter of largest metastasis: 0 mm  Residual Cancer Burden Score: 1.687  Residual Cancer Burden Class: RCB-II  - Pathologic stage from 11/12/2018: Stage IB (rpT1b, pN0, cM0, G3, ER-, PR-, HER2-) - Signed by Talbert Cage, DO on 11/13/2018    Scribe's Attestation: [name], DO obtained and performed the history, physical exam and medical decision making elements that were entered into the chart.  Signed by Gretchen Short, Scribe, on June 22, 2019 9:04 AM    ----------------------------------------------------------------------------------------------------------------------  Jun 29, 2019 9:42 AM. Documentation assistance provided by the Scribe. I was present during the time the encounter was recorded. The information recorded  by the Scribe was done at my direction and has been reviewed and validated by me.  ----------------------------------------------------------------------------------------------------------------------       Royann Shivers, DO  Surgical Breast Oncology and Oncoplastics  06/22/2019  1:07 PM

## 2019-06-19 NOTE — Unmapped (Signed)
Patient was scheduled to meet with me today at 1:30. She did not connect for our visit, so I called her and was unable to leave a voice mail message because her mailbox was full. Called back again 15 minutes later and was able to get her to answer the phone. She told me that she is currently in [place] in a public place so we weren't able to do the visit. Rescheduling her for 5/17 at 3:30.

## 2019-06-19 NOTE — Unmapped (Signed)
Sheri [name] is here for evaluation of bilateral anteromedial, and anterolateral Knee. Pain has been present for 3-4 months.  Mechanism of injury: no known reportable specific injury per the patient. Pain is 8/10. Pain is described as constant dull and achy and has not changed. Aggravating factors: rising after sitting and weather changes. Relieving factors: rest and OTC pain relievers. Patient reports: no mechanical symptoms. Previous history: left meniscal tear (nine years ago).  Patient has been undergoing Chemotherapy for breast cancer. She recently had bone scans done of her knee that showed degenerative changes.    Entered by Jerrol Banana, ATC acting as scribe for Bonnye Fava, MD  Signature: Jerrol Banana  Date: 06/19/2019  Time: 10:33 AM

## 2019-06-19 NOTE — Unmapped (Signed)
ORTHOPAEDIC NEW CLINIC NOTE     Sheri [name] is seen in consultation at the request of Valentina Lucks, [address]  [address]  [place],  [place] [address] For evaluation of Knee Pain       ASSESSMENT:  Sheri [name] is a 24 y.o. female with:  1. Patellofemoral syndrome of both knees    2. Pain in both knees, unspecified chronicity    3. Primary osteoarthritis of right knee         PLAN:  Her symptoms are most consistent with patellofemoral syndrome.  There is slight arthritis in her right knee on plain films.  She also history of meniscal tear on the right, which could be contributing.  However, I let her know that I typically would try to avoid steroid injections in young patients, due to the risks later in life with exposure to high levels of steroids.  I recommended a very conservative approach in the beginning, and referred her to physical therapy, recommended Voltaren which he is already been prescribed, and recommended more supportive footwear.  She should also worked to lose weight.    No Procedures Performed       Follow Up: Return in about 6 weeks (around 07/31/2019) for video visit, F/U PT.       MEDICAL DECISION MAKING (level of service defined by 2/3 elements)     Number/Complexity of Problems Addressed 1 undiagnosed new problem with uncertain prognosis (99204/99214)   Amount/Complexity of Data to be Reviewed/Analyzed 2 points: Review prior notes (1 point per unique source); Review test results (1 point per unique test); Order tests (1 point per unique test) (99203/99213)   Risk of Complications/Morbidity/Mortality of Management Physical Therapy/Occupational Therapy (99203/99213)         SUBJECTIVE:  Chief Complaint: Knee Pain       History of Present Illness:   24 y.o. female who presents with:    Bilateral knee pain.  Hx of breast cancer, now s/p chemo and BL mastectomy.  Pain in knees worse over 3-4 months  Bone scan showed degeneration, no mets.  10+ pain.  Worse with cold, movement.  Hurts daily.  Walking feels better, hurts after.  Knees + swelling.  Denies warmth and redness.  Gained 70 lbs during chemo.  Pain anterior and medial        All relevant orthopedic medical chart documents reviewed.    Medical History  Past Medical History:   Diagnosis Date   ??? Anxiety    ??? Depression    ??? Malignant neoplasm of upper-outer quadrant of left breast in female, estrogen receptor negative (CMS-HCC) 12/10/2017   ??? Panic disorder    ??? PTSD (post-traumatic stress disorder)     Surgical History  Past Surgical History:   Procedure Laterality Date   ??? AUGMENTATION MAMMAPLASTY Left 08/2018    left only    ??? BREAST BIOPSY Left 2019    malignant   ??? CHEMOTHERAPY Left 11/2017    May 13 2018   ??? IR INSERT PORT AGE GREATER THAN 5 YRS  12/18/2017    IR INSERT PORT AGE GREATER THAN 5 YRS 12/18/2017 [name], MD IMG [facility]   ??? MASTECTOMY Left 05/2018   ??? PR BX/REMV,LYMPH NODE,DEEP AXILL Left 05/30/2018    Procedure: BX/EXC LYMPH NODE; OPEN, DEEP AXILRY NODE;  Surgeon: Talbert Cage, DO;  Location: ASC OR [facility];  Service: Surgical Oncology Breast   ??? PR EXC TUMOR SOFT TISSUE NECK/ANT THORAX SUBQ  3+CM Right 10/23/2018    Procedure: Priority EXCISION, TUMOR, SOFT TISSUE OF NECK OR ANTERIOR THORAX, SUBCUTANEOUS; 3 CM OR GREATER;  Surgeon: Talbert Cage, DO;  Location: ASC OR [facility];  Service: Surgical Oncology Breast   ??? PR IMPLNT BIO IMPLNT FOR SOFT TISSUE REINFORCEMENT Left 05/30/2018    Procedure: IMPLANTATION BIOLOGIC IMPLANT(EG, ACELLULAR DERMAL MATRIX) FOR SOFT TISSUE REINFORCEMENT(EG, BREAST, TRUNK);  Surgeon: Arsenio Katz, MD;  Location: ASC OR [facility];  Service: Plastics   ??? PR INSERTION BREAST IMPLANT SAME DAY OF MASTECTOMY Left 05/30/2018    Procedure: IMMED INSRT BREAST PROSTH AFTER MASTOPEX/MASTECT;  Surgeon: Arsenio Katz, MD;  Location: ASC OR [facility];  Service: Plastics   ??? PR INTRAOPERATIVE SENTINEL LYMPH NODE ID W DYE INJECTION Left 05/30/2018    Procedure: INTRAOPERATIVE IDENTIFICATION SENTINEL LYMPH NODE(S) INCLUDE INJECTION NON-RADIOACTIVE DYE, WHEN PERFORMED;  Surgeon: Talbert Cage, DO;  Location: ASC OR [facility];  Service: Surgical Oncology Breast   ??? PR MASTECTOMY, SIMPLE, COMPLETE Left 05/30/2018    Procedure: Urgent MASTECTOMY, SIMPLE, COMPLETE;  Surgeon: Talbert Cage, DO;  Location: ASC OR [facility];  Service: Surgical Oncology Breast   ??? PR RECMPL WND TRUNK 2.6-7.5 CM Left 10/23/2018    Procedure: REPR COMPLX TRUNK; 2.6 CM TO 7.5 CM;  Surgeon: Arsenio Katz, MD;  Location: ASC OR [facility];  Service: Plastics   ??? PR REPLACEMENT TISSUE EXPANDER W/PERMANENT IMPLANT Left 09/24/2018    Procedure: REPLACEMENT OF TISSUE EXPANDER WITH PERMANENT PROSTHESIS;  Surgeon: Arsenio Katz, MD;  Location: MAIN OR [facility]  Service: Plastics   ??? PR REVISION OF RECONSTRUCTED BREAST Left 09/24/2018    Procedure: R21 - REVISION OF RECONSTRUCTED  BREAST;  Surgeon: Arsenio Katz, MD;  Location: MAIN OR [facility];  Service: Plastics   ??? PR SUSPENSION OF BREAST Right 09/24/2018    Procedure: MASTOPEXY FOR SYMMETRY PURPOSES;  Surgeon: Arsenio Katz, MD;  Location: MAIN OR [facility];  Service: Plastics   ??? PR TISSUE EXPANDER PLACEMENT BREAST RECONSTRUCTION Left 05/30/2018    Procedure: BREAST RECON IMMED/DELAY W/EXPANDR W/SUBSQT EXPA;  Surgeon: Arsenio Katz, MD;  Location: ASC OR [facility];  Service: Plastics    Medications  has a current medication list which includes the following prescription(s): acetaminophen, diclofenac sodium, ergocalciferol-1,250 mcg (50,000 unit), hydrocortisone, ibuprofen, loperamide, methylprednisolone, mometasone, pantoprazole, sertraline, and silver sulfadiazine.   Tobacco/Alcohol History  Social History     Tobacco Use   Smoking Status Never Smoker   Smokeless Tobacco Never Used     Social History     Substance and Sexual Activity   Alcohol Use Not Currently    Social History        Occupational History   ??? Not on file       Home Address:  [address] [place] [address] Family History  Family History   Problem Relation Age of Onset   ??? Skin cancer Mother    ??? Breast cancer Neg Hx    ??? Colon cancer Neg Hx    ??? Endometrial cancer Neg Hx    ??? Ovarian cancer Neg Hx         Allergies   Amoxicillin       Review of Systems  .   Marland Kitchen       OBJECTIVE:  DETAILED PHYSICAL EXAM  General Appearance ?? well-nourished and no acute distress   Mood and Affect ?? alert, cooperative and pleasant   Gait and Station ?? Smooth, heel-toe, non-antalgic gait  Cardiovascular ?? well-perfused distally and no swelling   Skin ?? Normal.   lymph ?? No lymphedema or adenopathy noted   resp ?? Non labored breathing   Sensation ?? Sensation intact to light touch distally     RIGHT Knee    ?? Inspection/palpation: No swelling, erythema, deformity, atrophy or hypertrophy noted.   ?? Effusion: none  ?? Alignment: normal  ?? Tenderness: medial joint line and patellofemoral joint  ?? Range of Motion: Flexion 120, Extension neutral  ?? Strength: normal  ?? Provocative Tests:         Lachman: negative        Anterior drawer: negative        Posterior drawer: negative        McMurray:  Positive Medial        Patellar Grind: negative        Valgus Stress at 30 Degrees: negative        Varus Stress at 30 Degrees: negative     LEFT Knee    ?? Inspection/palpation: No swelling, erythema, deformity, atrophy or hypertrophy noted.   ?? Effusion: none  ?? Alignment: normal  ?? Tenderness: non-tender  ?? Range of Motion: Flexion 120, Extension neutral  ?? Strength: normal  ?? Provocative Tests:         Lachman: negative        Anterior drawer: negative        Posterior drawer: negative        McMurray:  Negative        Patellar Grind: negative        Valgus Stress at 30 Degrees: negative        Varus Stress at 30 Degrees: negative         Test Results  Imaging  I personally reviewed Xray reviewing the images presonally as well as the relevant available reports      This note was dictated using Scientist, clinical (histocompatibility and immunogenetics).  Please excuse any spelling or grammatical errors.

## 2019-06-22 ENCOUNTER — Ambulatory Visit: Admit: 2019-06-22 | Discharge: 2019-06-23

## 2019-06-22 MED ORDER — PENTOXIFYLLINE ER 400 MG TABLET,EXTENDED RELEASE
ORAL_TABLET | Freq: Three times a day (TID) | ORAL | 11 refills | 30.00000 days | Status: CP
Start: 2019-06-22 — End: 2020-06-21

## 2019-06-22 NOTE — Unmapped (Addendum)
It was a pleasure to see you today in the Breast Surgical Oncology Clinic. Please call my nurse navigator, Lindwood Qua, if you have any interval questions or concerns.     1. Follow up in August with a right Mammogram  2. We discussed lifestyle modifications for breast pain. I recommend taking Vitamin E, Vitamin B6, and Trental. A prescription for Trental was provided today. Below you will find more information.  3. Continue surveillance as outlined. NCCN recommends that you continue to see your primary care provider for all general health care recommended for a patient your age, including cancer screening tests.  Any symptoms should be brought to the attention of your provider.  You should see your medical oncologist every 3 to 6 months for the first 3 years, every 6 to 12 months for years 4 and 5, and annually thereafter. Visits with your surgeon and radiation oncologist should be rotated with your medical oncology team initially until you are released to medical oncology or a survivorship clinic. The surgery visit should typically be paired with the imaging appointment so that we can review mammogram findings and recommendations.     BREAST PAIN OR DISCHARGE     1. 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     2.  Eliminate Caffeine. You will be provided with a list of beverages, food products,      3.  Heat: Warm showers and heating pads     4.  A well-fitted bra      Surgical oncology contact information:  For appointments, please call [phone].     Nurse Navigator available Monday through Friday 8:30 am to 4:30 pm:   Lindwood Qua, RN:    Phone: [phone]   Fax: [phone] attention Vivia Budge    For emergencies, evenings or weekends, please call [phone] and ask for the surgical oncology resident on call. Please be aware that this person is also responding to in-hospital emergencies and patient issues and may not answer your phone call immediately, but will return your call as soon as possible.

## 2019-06-24 NOTE — Unmapped (Signed)
Adolescent and Young Adult Cancer Program Visit   Comprehensive Cancer Support Program     THE AYA TEAM IS OFFSITE AND REMAINS AVAILABLE VIRTUALLY Monday and Wednesday from 8am - 5pm. Available in-person on Tuesdays, Thursdays, and Fridays 8am - 5pm.    Service date: June 24, 2019    Encounter type: Virtual - text message exchange??  ??  Clinician:??[name], LCSWA  ??  Patient identifiers:??Sheri??[name]??is a 24 y.o.??with breast cancer and a history of??depression,??panic disorder,??and PTSD.??Sheri [name]??uses she/her/hers??pronouns.??    VISIT SUMMARY     Notes: Sent text message offering time to check in via phone for counseling support this week. No response; will attempt to contact next week.    I will continue to follow Sheri [name] for AYA-appropriate support. [name] has my contact information and has been encouraged to contact me as needed.     06/24/2019     [name], Theresia Majors  Adolescent and Young Adult Clinical Social Worker  Pager: [phone]

## 2019-07-13 ENCOUNTER — Telehealth: Admit: 2019-07-13 | Discharge: 2019-07-14

## 2019-07-13 MED ORDER — BUPROPION HCL XL 150 MG 24 HR TABLET, EXTENDED RELEASE
ORAL_TABLET | Freq: Every day | ORAL | 11 refills | 30.00000 days | Status: CP
Start: 2019-07-13 — End: 2020-07-12

## 2019-07-13 MED ORDER — BUPROPION HCL XL 150 MG 24 HR TABLET, EXTENDED RELEASE: 150 mg | tablet | Freq: Every day | 3 refills | 90 days | Status: AC
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
## 2019-07-14 NOTE — Unmapped (Signed)
It was very nice talking with you today. I'm glad that you are feeling better. Here is our plan:    ?? Continue taking sertraline 200 mg daily  ?? Start bupropion XL (Wellbutrin) 150 mg in the mornings.    Please let me know if you have any questions or concerns.    Best,  [name]      Comprehensive Cancer Support Program (CCSP) - Psychiatry Outpatient Clinic   After Visit Summary    It was a pleasure to see you today in the [facility]???s Comprehensive Cancer Support Program (CCSP). The CCSP is a multidisciplinary program dedicated to helping patients, caregivers, and families with cancer treatment, recovery and survivorship.      To schedule, cancel, or change your appointment:  Please call the [facility] schedulers at [phone], Monday through Friday 8AM - 5PM.  Someone will return your call within 24 hours.      If you have a question about your medicines or you need to contact your provider:  Please call the CCSP program coordinator, Myrene Galas, at [phone].     For after hours urgent issues, you may call [phone] or call the I need to talk line at [phone]-TALK ([phone] anytime 24/7.    CCSP Patient and Family Resource Center: [phone].    CCSP Website:  [email or web address]    For prescription refills, please allow at least 24 hours (during business hours, M-F) for providers to call in refills to your pharmacy. We are generally unable to accommodate same-day requests for refills.     If you are taking any controlled substances (such as anxiety or sleep medications), you must use them as the directions say to use them. We generally do not provide early refills over the phone without clear reason, and it would be inappropriate to obtain the medications from other doctors. We routinely use the [place] controlled substance database to monitor prescription drug use.       Patient Education        bupropion  Pronunciation:  byoo PRO pee on  Brand: Aplenzin, Forfivo XL, Wellbutrin SR, Wellbutrin XL, Zyban Advantage Pack  What is the most important information I should know about bupropion?  You should not take bupropion if you have seizures or an eating disorder, or if you have suddenly stopped using alcohol, seizure medication, or sedatives. If you take Wellbutrin for depression, do not also take Zyban to quit smoking.   Do not use bupropion within 14 days before or 14 days after you have used an MAO inhibitor, such as isocarboxazid, linezolid, methylene blue injection, phenelzine, rasagiline, selegiline, or tranylcypromine.  Some young people have thoughts about suicide when first taking an antidepressant. Stay alert to changes in your mood or symptoms. Report any new or worsening symptoms to your doctor.  What is bupropion?  Bupropion is an antidepressant used to treat major depressive disorder and seasonal affective disorder. The Zyban brand of bupropion is used to help people stop smoking by reducing cravings and other withdrawal effects.  Bupropion may also be used for purposes not listed in this medication guide.  What should I discuss with my healthcare provider before taking bupropion?  You should not take bupropion if you are allergic to it, or if you have:  ?? a seizure disorder;  ?? an eating disorder such as anorexia or bulimia; or  ?? if you have suddenly stopped using alcohol, seizure medication, or a sedative (such as Xanax, Valium, Fiorinal, Klonopin, and others).  Do not use an MAO inhibitor within 14 days before or 14 days after you take bupropion. A dangerous drug interaction could occur. MAO inhibitors include isocarboxazid, linezolid, phenelzine, rasagiline, selegiline, and tranylcypromine.  Do not take bupropion to treat more than one condition at a time. If you take bupropion for depression, do not also take this medicine to quit smoking.   Bupropion may cause seizures, especially if you have certain medical conditions or use certain drugs. Tell your doctor about all of your medical conditions and the drugs you use.  Tell your doctor if you have ever had:  ?? a head injury, seizures, or brain or spinal cord tumor;  ?? narrow-angle glaucoma;  ?? heart disease, high blood pressure, or a heart attack;  ?? diabetes;  ?? kidney or liver disease (especially cirrhosis);  ?? depression, bipolar disorder, or other mental illness; or  ?? if you drink alcohol.  Some young people have thoughts about suicide when first taking an antidepressant. Your doctor will need to check your progress at regular visits. Your family or other caregivers should also be alert to changes in your mood or symptoms.  Ask your doctor about taking this medicine if you are pregnant. It is not known whether bupropion will harm an unborn baby. However, you may have a relapse of depression if you stop taking your antidepressant. Tell your doctor right away if you become pregnant. Do not start or stop taking bupropion without your doctor's advice.  If you are pregnant, your name may be listed on a pregnancy registry to track the effects of bupropion on the baby.  It may not be safe to breastfeed while using this medicine. Ask your doctor about any risk.  Bupropion is not approved for use by anyone younger than 25 years old.  How should I take bupropion?  Follow all directions on your prescription label and read all medication guides or instruction sheets. Your doctor may occasionally change your dose. Use the medicine exactly as directed. Too much of this medicine can increase your risk of a seizure.  You may take bupropion with or without food.  Swallow the extended-release tablet whole and do not crush, chew, or break it.  You should not change your dose or stop using bupropion suddenly, unless you have a seizure while taking this medicine. Stopping suddenly can cause unpleasant withdrawal symptoms. Ask your doctor how to safely stop using bupropion.  If you take Zyban to help you stop smoking, you may continue to smoke for about 1 week after you start the medicine. Set a date to quit smoking during the first 2 weeks of treatment. Talk to your doctor if you have trouble quitting after taking Zyban for 7 to 12 weeks.  Your doctor may prescribe a nicotine replacement product (such as patches or gum) to help you stop smoking. Start using the nicotine replacement product on the same day you stop (quit) smoking or using tobacco products.  Some people taking bupropion (Wellbutrin or Zyban) have had high blood pressure that is severe, especially when also using a nicotine replacement product (patch or gum). Your blood pressure may need to be checked before and during treatment with bupropion.  Read and carefully follow any Instructions for Use provided with your medicine. Ask your doctor or pharmacist if you do not understand these instructions.  You may have nicotine withdrawal symptoms when you stop smoking, including: increased appetite, weight gain, trouble sleeping, trouble concentrating, slower heart rate, having the urge to smoke,  and feeling anxious, restless, depressed, angry, frustrated, or irritated. These symptoms may occur with or without using medication such as Zyban.   Smoking cessation may also cause new or worsening mental health problems, such as depression.   This medicine may affect a drug-screening urine test and you may have false results. Tell the laboratory staff that you use bupropion.  Store at room temperature away from moisture, heat, and light.  What happens if I miss a dose?  Skip the missed dose and use your next dose at the regular time. Do not use two doses at one time.  What happens if I overdose?  Seek emergency medical attention or call the Poison Help line at 250-017-7848. An overdose of bupropion can be fatal.  Overdose symptoms may include muscle stiffness, hallucinations, fast or uneven heartbeat, shallow breathing, or fainting.  What should I avoid while taking bupropion?  Drinking alcohol with bupropion may increase your risk of seizures. If you drink alcohol regularly, talk with your doctor before changing the amount you drink. Bupropion can also cause seizures in a regular drinker who suddenly stops drinking at the start of treatment with bupropion.  Avoid driving or hazardous activity until you know how this medicine will affect you. Your reactions could be impaired.  What are the possible side effects of bupropion?  Get emergency medical help if you have signs of an allergic reaction (hives, itching, fever, swollen glands, difficult breathing, swelling in your face or throat) or a severe skin reaction (fever, sore throat, burning eyes, skin pain, red or purple skin rash with blistering and peeling).  Report any new or worsening symptoms to your doctor, such as: mood or behavior changes, anxiety, depression, panic attacks, trouble sleeping, or if you feel impulsive, irritable, agitated, hostile, aggressive, restless, hyperactive (mentally or physically), more depressed, or have thoughts about suicide or hurting yourself.  Call your doctor at once if you have:  ?? a seizure (convulsions);  ?? confusion, unusual changes in mood or behavior;  ?? blurred vision, tunnel vision, eye pain or swelling, or seeing halos around lights;  ?? fast or irregular heartbeats; or  ?? a manic episode --racing thoughts, increased energy, reckless behavior, feeling extremely happy or irritable, talking more than usual, severe problems with sleep.  Common side effects may include:  ?? dry mouth, sore throat, stuffy nose;  ?? ringing in the ears;  ?? blurred vision;  ?? nausea, vomiting, stomach pain, loss of appetite, constipation;  ?? sleep problems (insomnia);  ?? tremors, sweating, feeling anxious or nervous;  ?? fast heartbeats;  ?? confusion, agitation, hostility;  ?? rash;  ?? weight loss;  ?? increased urination;  ?? headache, dizziness; or  ?? muscle or joint pain.  This is not a complete list of side effects and others may occur. Call your doctor for medical advice about side effects. You may report side effects to FDA at 1-800-FDA-1088.  What other drugs will affect bupropion?  You may have a higher risk of seizures if you use certain other medicines while taking bupropion.    Many drugs can affect bupropion. This includes prescription and over-the-counter medicines, vitamins, and herbal products. Not all possible interactions are listed here. Tell your doctor about all your current medicines and any medicine you start or stop using.  Where can I get more information?  Your pharmacist can provide more information about bupropion.  Remember, keep this and all other medicines out of the reach of children, never share your medicines with  others, and use this medication only for the indication prescribed.   Every effort has been made to ensure that the information provided by [employer] ('[employer]') is accurate, up-to-date, and complete, but no guarantee is made to that effect. Drug information contained herein may be time sensitive. [employer] information has been compiled for use by healthcare practitioners and consumers in the [place] and therefore [employer] does not warrant that uses outside of the [place] are appropriate, unless specifically indicated otherwise. [employer]'s drug information does not endorse drugs, diagnose patients or recommend therapy. [employer]'s drug information is an [employer], corporate to assist licensed healthcare practitioners in caring for their patients and/or to serve consumers viewing this service as a supplement to, and not a substitute for, the expertise, skill, knowledge and judgment of healthcare practitioners. The absence of a warning for a given drug or drug combination in no way should be construed to indicate that the drug or drug combination is safe, effective or appropriate for any given patient. [employer] does not assume any responsibility for any aspect of healthcare administered with the aid of information [employer] provides. The information contained herein is not intended to cover all possible uses, directions, precautions, warnings, drug interactions, allergic reactions, or adverse effects. If you have questions about the drugs you are taking, check with your doctor, nurse or pharmacist.  Copyright [id number] [employer] Version: 24.01. Revision date: 03/24/2018.  Care instructions adapted under license by [facility]. If you have questions about a medical condition or this instruction, always ask your healthcare professional. [employer] disclaims any warranty or liability for your use of this information.

## 2019-07-24 NOTE — Unmapped (Signed)
This patient has been disenrolled from the [facility] specialty pharmacy services due to md canceled prescriptions because pt never came to follow up appointments.    Rollen Sox  [facility] Specialty Pharmacist

## 2019-07-31 ENCOUNTER — Ambulatory Visit: Admit: 2019-07-31 | Attending: Internal Medicine | Primary: Internal Medicine

## 2019-08-10 ENCOUNTER — Telehealth: Admit: 2019-08-10 | Discharge: 2019-08-11

## 2019-08-10 DIAGNOSIS — C50412 Malignant neoplasm of upper-outer quadrant of left female breast: Principal | ICD-10-CM

## 2019-08-10 DIAGNOSIS — E559 Vitamin D deficiency, unspecified: Principal | ICD-10-CM

## 2019-08-10 DIAGNOSIS — Z171 Estrogen receptor negative status [ER-]: Principal | ICD-10-CM

## 2019-08-10 MED ORDER — BUPROPION HCL XL 150 MG 24 HR TABLET, EXTENDED RELEASE
ORAL_TABLET | Freq: Every day | ORAL | 11 refills | 30 days | Status: CP
Start: 2019-08-10 — End: 2020-08-09
  Filled 2019-08-25: qty 30, 30d supply, fill #0

## 2019-08-10 MED ORDER — SERTRALINE 100 MG TABLET
ORAL_TABLET | Freq: Every day | ORAL | 11 refills | 30.00000 days | Status: CP
Start: 2019-08-10 — End: 2020-08-09
  Filled 2019-08-25: qty 60, 30d supply, fill #0

## 2019-08-10 MED ORDER — ERGOCALCIFEROL (VITAMIN D2) 1,250 MCG (50,000 UNIT) CAPSULE
ORAL_CAPSULE | ORAL | 3 refills | 28 days | Status: CP
Start: 2019-08-10 — End: 2020-08-09
  Filled 2019-08-25: qty 4, 28d supply, fill #0

## 2019-08-25 MED FILL — BUPROPION HCL XL 150 MG 24 HR TABLET, EXTENDED RELEASE: 30 days supply | Qty: 30 | Fill #0 | Status: AC

## 2019-08-25 MED FILL — ERGOCALCIFEROL (VITAMIN D2) 1,250 MCG (50,000 UNIT) CAPSULE: 28 days supply | Qty: 4 | Fill #0 | Status: AC

## 2019-08-25 MED FILL — SERTRALINE 100 MG TABLET: 30 days supply | Qty: 60 | Fill #0 | Status: AC

## 2019-08-30 ENCOUNTER — Emergency Department (HOSPITAL_COMMUNITY)
Admission: EM | Admit: 2019-08-30 | Discharge: 2019-09-02 | Disposition: A | Discharge status: Home / Self Care | Payer: Self-pay | Attending: Emergency Medicine | Admitting: Emergency Medicine

## 2019-08-30 DIAGNOSIS — F332 Major depressive disorder, recurrent severe without psychotic features: Secondary | ICD-10-CM | POA: Insufficient documentation

## 2019-08-30 DIAGNOSIS — F1310 Sedative, hypnotic or anxiolytic abuse, uncomplicated: Secondary | ICD-10-CM | POA: Insufficient documentation

## 2019-08-30 DIAGNOSIS — Z23 Encounter for immunization: Secondary | ICD-10-CM | POA: Insufficient documentation

## 2019-08-30 DIAGNOSIS — U071 COVID-19: Secondary | ICD-10-CM | POA: Insufficient documentation

## 2019-08-30 DIAGNOSIS — S51812A Laceration without foreign body of left forearm, initial encounter: Secondary | ICD-10-CM | POA: Insufficient documentation

## 2019-08-30 DIAGNOSIS — F32A Depression, unspecified: Secondary | ICD-10-CM

## 2019-08-30 DIAGNOSIS — Y998 Other external cause status: Secondary | ICD-10-CM | POA: Insufficient documentation

## 2019-08-30 DIAGNOSIS — X789XXA Intentional self-harm by unspecified sharp object, initial encounter: Secondary | ICD-10-CM | POA: Insufficient documentation

## 2019-08-30 DIAGNOSIS — IMO0002 Reserved for concepts with insufficient information to code with codable children: Secondary | ICD-10-CM

## 2019-08-30 DIAGNOSIS — Y9389 Activity, other specified: Secondary | ICD-10-CM | POA: Insufficient documentation

## 2019-08-30 DIAGNOSIS — Y929 Unspecified place or not applicable: Secondary | ICD-10-CM | POA: Insufficient documentation

## 2019-08-30 LAB — CBC WITH DIFFERENTIAL/PLATELET
Abs Immature Granulocytes: 0.03 10*3/uL (ref 0.00–0.07)
Basophils Absolute: 0 10*3/uL (ref 0.0–0.1)
Basophils Relative: 0 %
Eosinophils Absolute: 0 10*3/uL (ref 0.0–0.5)
Eosinophils Relative: 1 %
HCT: 32.5 % — ABNORMAL LOW (ref 36.0–46.0)
Hemoglobin: 10.1 g/dL — ABNORMAL LOW (ref 12.0–15.0)
Immature Granulocytes: 0 %
Lymphocytes Relative: 11 %
Lymphs Abs: 0.7 10*3/uL (ref 0.7–4.0)
MCH: 24 pg — ABNORMAL LOW (ref 26.0–34.0)
MCHC: 31.1 g/dL (ref 30.0–36.0)
MCV: 77.2 fL — ABNORMAL LOW (ref 80.0–100.0)
Monocytes Absolute: 0.5 10*3/uL (ref 0.1–1.0)
Monocytes Relative: 7 %
Neutro Abs: 5.7 10*3/uL (ref 1.7–7.7)
Neutrophils Relative %: 81 %
Platelets: 433 10*3/uL — ABNORMAL HIGH (ref 150–400)
RBC: 4.21 MIL/uL (ref 3.87–5.11)
RDW: 15.5 % (ref 11.5–15.5)
WBC: 7 10*3/uL (ref 4.0–10.5)
nRBC: 0 % (ref 0.0–0.2)

## 2019-08-30 LAB — SARS CORONAVIRUS 2 BY RT PCR (HOSPITAL ORDER, PERFORMED IN ~~LOC~~ HOSPITAL LAB): SARS Coronavirus 2: POSITIVE — AB

## 2019-08-30 LAB — RAPID URINE DRUG SCREEN, HOSP PERFORMED
Amphetamines: NOT DETECTED
Barbiturates: NOT DETECTED
Benzodiazepines: POSITIVE — AB
Cocaine: NOT DETECTED
Opiates: NOT DETECTED
Tetrahydrocannabinol: NOT DETECTED

## 2019-08-30 LAB — ACETAMINOPHEN LEVEL: Acetaminophen (Tylenol), Serum: <10 ug/mL — ABNORMAL LOW (ref 10–30)

## 2019-08-30 LAB — BASIC METABOLIC PANEL
Anion gap: 11 (ref 5–15)
BUN: 9 mg/dL (ref 6–20)
CO2: 21 mmol/L — ABNORMAL LOW (ref 22–32)
Calcium: 9.2 mg/dL (ref 8.9–10.3)
Chloride: 108 mmol/L (ref 98–111)
Creatinine, Ser: 0.6 mg/dL (ref 0.44–1.00)
GFR calc Af Amer: >60 mL/min (ref >60)
GFR calc non Af Amer: >60 mL/min (ref >60)
Glucose, Bld: 103 mg/dL — ABNORMAL HIGH (ref 70–99)
Potassium: 3.3 mmol/L — ABNORMAL LOW (ref 3.5–5.1)
Sodium: 140 mmol/L (ref 135–145)

## 2019-08-30 LAB — I-STAT BETA HCG BLOOD, ED (MC, WL, AP ONLY): I-stat hCG, quantitative: <5 m[IU]/mL (ref <5)

## 2019-08-30 LAB — ETHANOL: Alcohol, Ethyl (B): <10 mg/dL (ref <10)

## 2019-08-30 LAB — SALICYLATE LEVEL: Salicylate Lvl: <7 mg/dL — ABNORMAL LOW (ref 7.0–30.0)

## 2019-08-30 MED ORDER — METHOCARBAMOL 500 MG PO TABS
500.0000 mg | ORAL_TABLET | Freq: Once | ORAL | Status: AC
  Administered 2019-08-30: 500 mg via ORAL
  Filled 2019-08-30: qty 1

## 2019-08-30 MED ORDER — TETANUS-DIPHTH-ACELL PERTUSSIS 5-2.5-18.5 LF-MCG/0.5 IM SUSP
0.5000 mL | Freq: Once | INTRAMUSCULAR | Status: AC
  Administered 2019-08-30: 0.5 mL via INTRAMUSCULAR
  Filled 2019-08-30: qty 0.5

## 2019-08-30 MED ORDER — ACETAMINOPHEN 325 MG PO TABS
650.0000 mg | ORAL_TABLET | Freq: Once | ORAL | Status: AC
  Administered 2019-08-30: 650 mg via ORAL
  Filled 2019-08-30: qty 2

## 2019-08-30 MED ORDER — SERTRALINE HCL 100 MG PO TABS
200.0000 mg | ORAL_TABLET | Freq: Every evening | ORAL | Status: DC
  Administered 2019-08-30 – 2019-09-01 (×3): 200 mg via ORAL
  Filled 2019-08-30 (×5): qty 2

## 2019-08-30 MED ORDER — ACETAMINOPHEN 500 MG PO TABS
1000.0000 mg | ORAL_TABLET | Freq: Once | ORAL | Status: AC
  Administered 2019-08-30: 1000 mg via ORAL
  Filled 2019-08-30: qty 2

## 2019-08-30 MED ORDER — IBUPROFEN 800 MG PO TABS
800.0000 mg | ORAL_TABLET | Freq: Once | ORAL | Status: AC
  Administered 2019-08-30: 800 mg via ORAL
  Filled 2019-08-30: qty 1
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
## 2019-08-30 NOTE — ED Notes (Signed)
Rn spoke w/ pt regarding placement and covid status. Pt states she felt sick 2 weeks ago but didn't realize she had COVID. Pt has been asymptomatic for 2 weeks. Rn explained that right now pt is here voluntarily as we are awaiting inpatient placement, but if she tried to leave, she would more than likely be involuntarily committed. Pt was agreeable, states she didn't like waiting in her room for days. RN gave pt coloring pages and crayons, will continue to monitor.

## 2019-08-30 NOTE — ED Notes (Signed)
TTS being done at bedside

## 2019-08-30 NOTE — ED Notes (Signed)
Called staffing for patient sitter, will send one when possible
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
## 2019-08-30 NOTE — ED Notes (Signed)
Placed order for 1:1 sitter. Called staffing office to check on sitter for pt, no answer.

## 2019-08-30 NOTE — ED Notes (Signed)
RN changed dressing on pt's laceration on L forearm. Bacitracin, non-adherent pad, gauze and ace bandage applied.

## 2019-08-30 NOTE — ED Notes (Signed)
Dinner Tray Ordered @ [phone].

## 2019-08-30 NOTE — ED Notes (Signed)
Lunch Tray Ordered @ 1040. 

## 2019-08-30 NOTE — ED Notes (Signed)
Melissa [name], mother, [phone] would like an update when available

## 2019-08-30 NOTE — ED Notes (Signed)
Pt wanded by security. 

## 2019-08-30 NOTE — Progress Notes (Signed)
Patient meets criteria for inpatient treatment. No appropriate beds available at [facility]. CSW faxed referrals to the following facilities for review:  [place]  TTS will continue to seek bed placement. (PT IS COVID POSITIVE, WHICH WILL LIKELY AFFECT PLACEMENT OPPORTUNITY AT THIS TIME).    Maxie Better, MSW, LCSW Clinical Social Worker 08/30/2019 11:13 AM

## 2019-08-30 NOTE — ED Notes (Signed)
Patient belongings in locker #8

## 2019-08-31 MED ORDER — LIP MEDEX EX OINT
1.0000 "application " | TOPICAL_OINTMENT | CUTANEOUS | Status: DC | PRN
  Filled 2019-08-31: qty 7

## 2019-08-31 MED ORDER — ACETAMINOPHEN 500 MG PO TABS
1000.0000 mg | ORAL_TABLET | Freq: Four times a day (QID) | ORAL | Status: DC | PRN
  Administered 2019-08-31 – 2019-09-02 (×4): 1000 mg via ORAL
  Filled 2019-08-31 (×4): qty 2

## 2019-08-31 MED ORDER — MELATONIN 3 MG PO TABS
3.0000 mg | ORAL_TABLET | Freq: Every day | ORAL | Status: DC
  Administered 2019-09-01 (×2): 3 mg via ORAL
  Filled 2019-08-31 (×2): qty 1

## 2019-08-31 MED ORDER — HYDROXYZINE HCL 25 MG PO TABS
25.0000 mg | ORAL_TABLET | Freq: Once | ORAL | Status: AC
  Administered 2019-08-31: 25 mg via ORAL
  Filled 2019-08-31: qty 1

## 2019-08-31 MED ORDER — IBUPROFEN 800 MG PO TABS
800.0000 mg | ORAL_TABLET | Freq: Four times a day (QID) | ORAL | Status: DC | PRN
  Administered 2019-08-31 – 2019-09-02 (×6): 800 mg via ORAL
  Filled 2019-08-31 (×6): qty 1
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
## 2019-08-31 NOTE — ED Notes (Signed)
Pt tearful after TTS. Pt asking for "something to calm her nerves". Informed Luellen Pucker - RN.

## 2019-08-31 NOTE — ED Notes (Signed)
Lunch Tray Ordered @ 4035.

## 2019-08-31 NOTE — Progress Notes (Signed)
      I was called by ER physician who reviewed this patient's case thoroughly.  She had an illness that was highly consistent with COVID-19 infection more than 3 weeks ago.  She has had no symptoms for at least a week.  Multiple family members tested positive for Covid.  She herself had a Covid test performed today and this came back positive on the PCR.  Cycle threshold number was 39.2  I think her picture clearly is c/w a person who has recovered from [place] 19 and does not require isoolation as she should NOT be infectious to others.  If I did not have the clinical hx the cycle number could mean early infection but her hx and epi is c/w past infection

## 2019-08-31 NOTE — ED Notes (Signed)
Pt requesting something to help her sleep. Will text the md.

## 2019-08-31 NOTE — ED Provider Notes (Signed)
Emergency Medicine Observation Re-evaluation Note  Matteson Blue is a 24 y.o. female, seen on rounds today.  Pt initially presented to the ED for complaints of Laceration and Suicidal Currently, the patient is stable and recommended for ip treatment by psychiatry.  Physical Exam  BP 139/75 (BP Location: Right Arm)   Pulse (!) 105   Temp 99.2 F (37.3 C) (Oral)   Resp 18   Ht 1.676 m (5\' 6" )   Wt 133.8 kg   SpO2 99%   BMI 47.61 kg/m  Physical Exam WDWN female nad  ED Course / MDM  EKG:    I have reviewed the labs performed to date as well as medications administered while in observation.  Recent changes in the last 24 hours include none. Plan  Current plan is for inpatient psychiatry treatment . Patient is not under full IVC at this time. Patient covid positive- Patient states she had some symptoms with nasal congestion about June 8, her mother became sick 2 days later and then tested positive for Covid.  Patient symptoms lasted for 2 weeks until approximately June 22.  She has now been asymptomatic since then.  Test that she received here is the first time that she has been tested for [facility] consult with IP to see if patient can be cleared to go to ip psych Discussed with Dr. Drucilla Schmidt.  He is going to call the lab and see if he can check the cycle number Please see Dr. Arlyss Queen note.  Patient does not appear to have active infection and is cleared from infectious disease to be placed in a psychiatric facility   Pattricia Boss, MD 08/31/19 1246

## 2019-08-31 NOTE — ED Notes (Signed)
Pt on the phone. After phone conversation, pt tearful and asked to speak to a counselor.

## 2019-08-31 NOTE — ED Notes (Signed)
Dr. Jeanell Sparrow spoke with ID and patient's positive covid is believed to be from  Past infection , Melissa [name] called and aware.

## 2019-08-31 NOTE — ED Notes (Signed)
PT talking on phone with Mother

## 2019-08-31 NOTE — ED Notes (Signed)
Pt's dinner ordered °

## 2019-08-31 NOTE — ED Notes (Signed)
TTS at bedside. 

## 2019-09-01 ENCOUNTER — Other Ambulatory Visit: Payer: Self-pay

## 2019-09-01 MED ORDER — BACITRACIN ZINC 500 UNIT/GM EX OINT: TOPICAL_OINTMENT | Freq: Two times a day (BID) | CUTANEOUS | Status: DC

## 2019-09-01 NOTE — BH Assessment (Signed)
Reassessment Note: Pt presents sitting on her bed, dressed in scrubs. Pt states she is feeling lonely in the ED. She denies SI, HI & AVH. Pt reports she did not intend to cut herself so badly- it happened when she stood in the car & she immediately knew the cut went too far. Pt states "its funny" that she has past panic disorder and anxiety r/t to dying and her having cancer and now she is hospitalized for SI. She is eager to return home where she has support and her dog. Pt states she would "love" to follow up with outpt counseling after this experience. Inpt tx continues to be recommended due to severity of self-injury.

## 2019-09-01 NOTE — ED Notes (Signed)
Breakfast ordered--Melissa [name]  

## 2019-09-01 NOTE — ED Provider Notes (Signed)
Emergency Medicine Observation Re-evaluation Note  Melissa [name] is a 24 y.o. female, seen on rounds today.  Pt initially presented to the ED for complaints of Laceration and Suicidal Currently, the patient is sitting in bed working on a word search puzzle.  Physical Exam  BP (!) 154/96 (BP Location: Right Arm)   Pulse (!) 122   Temp 98.5 F (36.9 C) (Oral)   Resp 20   Ht 5\' 6"  (1.676 m)   Wt 133.8 kg   SpO2 98%   BMI 47.61 kg/m  Physical Exam Pleasant, awake, alert. Wound to left arm dressing removed, healing well, staples in place, no signs of infection. ED Course / MDM  EKG:    I have reviewed the labs performed to date as well as medications administered while in observation.  Recent changes in the last 24 hours include cleared by ID for psych placement. Plan  Current plan is for IP placement. Patient is not under full IVC at this time. Patient had requested something to help with sleep, Melatonin is ordered, patient states this did help.    Tacy Learn, PA-C 09/01/19 1151    Davonna Belling, MD 09/01/19 [phone]
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
## 2019-09-02 NOTE — ED Notes (Signed)
Dressing changed and instructions given for staple removal.

## 2019-09-02 NOTE — ED Provider Notes (Signed)
Emergency Medicine Observation Re-evaluation Note  Melissa Melissa [name] is Melissa [name] 24 y.o. female, seen on rounds today.  Pt initially presented to the ED for complaints of Laceration and Suicidal Currently, the patient is pleasant, waiting for dc.  Denies SI, HI, AVH.  Physical Exam  BP 139/78 (BP Location: Right Arm)   Pulse (!) 102   Temp 97.8 F (36.6 C) (Oral)   Resp 18   Ht 5\' 6"  (1.676 m)   Wt 133.8 kg   SpO2 97%   BMI 47.61 kg/m  Physical Exam Vitals and nursing note reviewed.  Constitutional:      General: She is not in acute distress.    Appearance: She is well-developed. She is not ill-appearing, toxic-appearing or diaphoretic.  HENT:     Head: Atraumatic.  Eyes:     Pupils: Pupils are equal, round, and reactive to light.  Cardiovascular:     Rate and Rhythm: Normal rate and regular rhythm.  Pulmonary:     Effort: Pulmonary effort is normal. No respiratory distress.  Abdominal:     General: There is no distension.     Palpations: Abdomen is soft.  Musculoskeletal:        General: Normal range of motion.     Cervical back: Normal range of motion and neck supple.  Skin:    General: Skin is warm and dry.     Comments: Recent laceration with closure to left arm.  No active bleeding or drainage  Neurological:     Mental Status: She is alert.     Comments: Ambulatory in room without difficulty    ED Course / MDM  EKG:    I have reviewed the labs performed to date as well as medications administered while in observation.  Recent changes in the last 24 hours include none. Pleasant. Denies SI, HGI, AVH.  Would like to go home Plan  Current plan is for discharge.  Patient evaluated by psychiatry this morning.  The states she is psychiatrically cleared.  10 used to deny SI, HI, AVH she has outpatient follow-up.  Patient will be discharged home to remain.  Patient is not under full IVC at this time.   Melissa Melissa [name] A, PA-C 09/02/19 1036    Veryl Speak, MD 09/02/19  1540

## 2019-09-07 ENCOUNTER — Ambulatory Visit
Admission: EM | Admit: 2019-09-07 | Discharge: 2019-09-07 | Disposition: A | Discharge status: Home / Self Care | Payer: Self-pay | Attending: Family Medicine | Admitting: Family Medicine

## 2019-09-07 ENCOUNTER — Other Ambulatory Visit: Payer: Self-pay

## 2019-09-07 DIAGNOSIS — Z4802 Encounter for removal of sutures: Secondary | ICD-10-CM

## 2019-09-07 NOTE — ED Notes (Signed)
16 staples removed from left forearm. Bacitracin and telfa applied and wrapped in kerlix

## 2019-09-07 NOTE — ED Triage Notes (Signed)
Pt with 16 staples to left forearm on 4th of July at [facility] ED. Well-approximated and no sx infection. Minimal pain.

## 2019-09-07 NOTE — ED Provider Notes (Signed)
[facility]    CSN: [id number] Arrival date & time: 09/07/19  1741      History   Chief Complaint Chief Complaint  Patient presents with  . Suture / Staple Removal    HPI Melissa [name] is a 24 y.o. female.   25 yo female here for staple removal off left forearm. Patient seen in ED on 08/30/19 for forearm laceration. States doing well. No complaints.    Suture / Staple Removal    Past Medical History:  Diagnosis Date  . Breast cancer ([place])   . Panic disorder   . Suicide attempt ([place])     There are no problems to display for this patient.   Past Surgical History:  Procedure Laterality Date  . MASTECTOMY      OB History   No obstetric history on file.      Home Medications    Prior to Admission medications   Medication Sig Start Date End Date Taking? Authorizing Provider  acetaminophen (TYLENOL) 500 MG tablet Take 1,000 mg by mouth every 6 (six) hours as needed for headache (pain).    [provider]  buPROPion (WELLBUTRIN XL) 150 MG 24 hr tablet Take 150 mg by mouth daily. Patient not taking: Reported on 08/30/2019 07/13/19   [provider]  ibuprofen (ADVIL) 200 MG tablet Take 800 mg by mouth every 6 (six) hours as needed for headache (pain).    [provider]  sertraline (ZOLOFT) 100 MG tablet Take 200 mg by mouth every evening.  07/13/19   [provider]  Vitamin D, Ergocalciferol, (DRISDOL) 1.25 MG (50000 UNIT) CAPS capsule Take 50,000 Units by mouth every Friday.    [provider]    Family History Family History  Problem Relation Age of Onset  . Healthy Mother   . Healthy Father     Social History Social History   Tobacco Use  . Smoking status: Never Smoker  . Smokeless tobacco: Never Used  Vaping Use  . Vaping Use: Never used  Substance Use Topics  . Alcohol use: No  . Drug use: Never     Allergies   Amoxicillin and Shellfish allergy   Review of Systems Review of  Systems   Physical Exam Triage Vital Signs ED Triage Vitals  Enc Vitals Group     BP 09/07/19 1805 (!) 158/54     Pulse Rate 09/07/19 1805 100     Resp 09/07/19 1805 17     Temp 09/07/19 1805 97.8 F (36.6 C)     Temp Source 09/07/19 1805 Oral     SpO2 09/07/19 1805 100 %     Weight 09/07/19 1804 294 lb 15.6 oz (133.8 kg)     Height 09/07/19 1804 5\' 6"  (1.676 m)     Head Circumference --      Peak Flow --      Pain Score 09/07/19 1804 1     Pain Loc --      Pain Edu? --      Excl. in [place]? --    No data found.  Updated Vital Signs BP (!) 158/54 (BP Location: Right Arm)   Pulse 100   Temp 97.8 F (36.6 C) (Oral)   Resp 17   Ht 5\' 6"  (1.676 m)   Wt 133.8 kg   LMP 08/17/2019   SpO2 100%   BMI 47.61 kg/m   Visual Acuity Right Eye Distance:   Left Eye Distance:   Bilateral Distance:  Right Eye Near:   Left Eye Near:    Bilateral Near:     Physical Exam Vitals and nursing note reviewed.  Constitutional:      General: She is not in acute distress.    Appearance: She is not toxic-appearing or diaphoretic.  Skin:    Comments: Left forearm skin laceration healing well; no drainage or other signs of infection  Neurological:     Mental Status: She is alert.      UC Treatments / Results  Labs (all labs ordered are listed, but only abnormal results are displayed) Labs Reviewed - No data to display  EKG   Radiology No results found.  Procedures Procedures (including critical care time)  Medications Ordered in UC Medications - No data to display  Initial Impression / Assessment and Plan / UC Course  I have reviewed the triage vital signs and the nursing notes.  Pertinent labs & imaging results that were available during my care of the patient were reviewed by me and considered in my medical decision making (see chart for details).      Final Clinical Impressions(s) / UC Diagnoses   Final diagnoses:  Encounter for staple removal    ED  Prescriptions    None     1. Staples removed by RN without complications; wound bandaged.  2. F/u prn   PDMP not reviewed this encounter.   Norval Gable, MD 09/07/19 [phone]

## 2019-09-28 ENCOUNTER — Ambulatory Visit: Admit: 2019-09-28

## 2019-10-05 DIAGNOSIS — Z171 Estrogen receptor negative status [ER-]: Principal | ICD-10-CM

## 2019-10-05 DIAGNOSIS — C50412 Malignant neoplasm of upper-outer quadrant of left female breast: Principal | ICD-10-CM

## 2019-10-05 DIAGNOSIS — R05 Cough: Principal | ICD-10-CM

## 2019-10-05 DIAGNOSIS — R042 Hemoptysis: Principal | ICD-10-CM

## 2019-10-09 ENCOUNTER — Ambulatory Visit: Admit: 2019-10-09 | Discharge: 2019-10-10

## 2019-10-09 ENCOUNTER — Institutional Professional Consult (permissible substitution): Admit: 2019-10-09 | Discharge: 2019-10-10

## 2019-10-09 ENCOUNTER — Ambulatory Visit: Admit: 2019-10-09 | Discharge: 2019-10-10 | Attending: Adult Health | Primary: Adult Health

## 2019-10-09 DIAGNOSIS — C50919 Malignant neoplasm of unspecified site of unspecified female breast: Secondary | ICD-10-CM

## 2019-10-09 DIAGNOSIS — C50412 Malignant neoplasm of upper-outer quadrant of left female breast: Principal | ICD-10-CM

## 2019-10-09 DIAGNOSIS — Z171 Estrogen receptor negative status [ER-]: Principal | ICD-10-CM

## 2019-10-09 DIAGNOSIS — R05 Cough: Principal | ICD-10-CM

## 2019-10-09 DIAGNOSIS — Z9889 Other specified postprocedural states: Principal | ICD-10-CM

## 2019-10-09 DIAGNOSIS — D649 Anemia, unspecified: Principal | ICD-10-CM

## 2019-10-09 DIAGNOSIS — C7800 Secondary malignant neoplasm of unspecified lung: Secondary | ICD-10-CM

## 2019-10-09 DIAGNOSIS — R042 Hemoptysis: Principal | ICD-10-CM

## 2019-10-09 DIAGNOSIS — F3341 Major depressive disorder, recurrent, in partial remission: Principal | ICD-10-CM

## 2019-10-09 DIAGNOSIS — F4310 Post-traumatic stress disorder, unspecified: Principal | ICD-10-CM

## 2019-10-09 MED ORDER — SERTRALINE 100 MG TABLET
ORAL_TABLET | Freq: Every day | ORAL | 2 refills | 30.00000 days | Status: CP
Start: 2019-10-09 — End: 2020-10-08
  Filled 2019-10-09: qty 60, 30d supply, fill #0

## 2019-10-09 MED ORDER — BENZONATATE 100 MG CAPSULE
ORAL_CAPSULE | Freq: Three times a day (TID) | ORAL | 1 refills | 20 days | Status: CP | PRN
Start: 2019-10-09 — End: 2020-10-08
  Filled 2019-10-09: qty 60, 20d supply, fill #0

## 2019-10-09 MED FILL — BENZONATATE 100 MG CAPSULE: 20 days supply | Qty: 60 | Fill #0 | Status: AC

## 2019-10-09 MED FILL — SERTRALINE 100 MG TABLET: 30 days supply | Qty: 60 | Fill #0 | Status: AC

## 2019-10-16 ENCOUNTER — Ambulatory Visit: Admit: 2019-10-16 | Discharge: 2019-10-18

## 2019-10-19 ENCOUNTER — Ambulatory Visit: Admit: 2019-10-19 | Discharge: 2019-11-17

## 2019-10-20 ENCOUNTER — Ambulatory Visit: Admit: 2019-10-20 | Discharge: 2019-10-21

## 2019-10-23 ENCOUNTER — Ambulatory Visit: Admit: 2019-10-23 | Discharge: 2019-10-24

## 2019-10-23 ENCOUNTER — Encounter: Admit: 2019-10-23 | Discharge: 2019-10-24

## 2019-10-30 ENCOUNTER — Ambulatory Visit: Admit: 2019-10-30 | Discharge: 2019-10-31 | Attending: Medical Oncology | Primary: Medical Oncology

## 2019-10-30 DIAGNOSIS — C50412 Malignant neoplasm of upper-outer quadrant of left female breast: Principal | ICD-10-CM

## 2019-10-30 DIAGNOSIS — Z171 Estrogen receptor negative status [ER-]: Principal | ICD-10-CM

## 2019-11-03 DIAGNOSIS — C50412 Malignant neoplasm of upper-outer quadrant of left female breast: Principal | ICD-10-CM

## 2019-11-03 DIAGNOSIS — Z171 Estrogen receptor negative status [ER-]: Principal | ICD-10-CM

## 2019-11-10 ENCOUNTER — Other Ambulatory Visit: Admit: 2019-11-10 | Discharge: 2019-11-11

## 2019-11-10 ENCOUNTER — Ambulatory Visit: Admit: 2019-11-10 | Discharge: 2019-11-11

## 2019-11-10 ENCOUNTER — Ambulatory Visit: Admit: 2019-11-10 | Discharge: 2019-11-11 | Attending: Adult Health | Primary: Adult Health

## 2019-11-10 DIAGNOSIS — C50412 Malignant neoplasm of upper-outer quadrant of left female breast: Principal | ICD-10-CM

## 2019-11-10 DIAGNOSIS — Z171 Estrogen receptor negative status [ER-]: Principal | ICD-10-CM

## 2019-11-10 DIAGNOSIS — D509 Iron deficiency anemia, unspecified: Principal | ICD-10-CM

## 2019-11-10 DIAGNOSIS — M2550 Pain in unspecified joint: Principal | ICD-10-CM

## 2019-11-10 MED ORDER — MELOXICAM 7.5 MG TABLET
ORAL_TABLET | Freq: Every day | ORAL | 2 refills | 30 days | Status: CP
Start: 2019-11-10 — End: 2020-11-09
  Filled 2019-11-10: qty 30, 30d supply, fill #0

## 2019-11-10 MED FILL — MELOXICAM 7.5 MG TABLET: 30 days supply | Qty: 30 | Fill #0 | Status: AC

## 2019-11-12 DIAGNOSIS — Z171 Estrogen receptor negative status [ER-]: Principal | ICD-10-CM

## 2019-11-12 DIAGNOSIS — C50412 Malignant neoplasm of upper-outer quadrant of left female breast: Principal | ICD-10-CM

## 2019-11-12 DIAGNOSIS — E559 Vitamin D deficiency, unspecified: Principal | ICD-10-CM

## 2019-11-16 ENCOUNTER — Ambulatory Visit: Admit: 2019-11-16 | Discharge: 2019-11-17

## 2019-11-16 DIAGNOSIS — C50412 Malignant neoplasm of upper-outer quadrant of left female breast: Principal | ICD-10-CM

## 2019-11-16 DIAGNOSIS — D509 Iron deficiency anemia, unspecified: Principal | ICD-10-CM

## 2019-11-16 DIAGNOSIS — Z171 Estrogen receptor negative status [ER-]: Secondary | ICD-10-CM

## 2019-11-18 DIAGNOSIS — Z171 Estrogen receptor negative status [ER-]: Principal | ICD-10-CM

## 2019-11-18 DIAGNOSIS — C50412 Malignant neoplasm of upper-outer quadrant of left female breast: Principal | ICD-10-CM

## 2019-12-01 ENCOUNTER — Other Ambulatory Visit: Admit: 2019-12-01 | Discharge: 2019-12-02

## 2019-12-01 ENCOUNTER — Ambulatory Visit: Admit: 2019-12-01 | Discharge: 2019-12-02 | Attending: Adult Health | Primary: Adult Health

## 2019-12-01 ENCOUNTER — Ambulatory Visit: Admit: 2019-12-01 | Discharge: 2019-12-02

## 2019-12-01 DIAGNOSIS — C50412 Malignant neoplasm of upper-outer quadrant of left female breast: Principal | ICD-10-CM

## 2019-12-01 DIAGNOSIS — Z171 Estrogen receptor negative status [ER-]: Principal | ICD-10-CM

## 2019-12-01 DIAGNOSIS — E559 Vitamin D deficiency, unspecified: Principal | ICD-10-CM

## 2019-12-01 DIAGNOSIS — D509 Iron deficiency anemia, unspecified: Principal | ICD-10-CM

## 2019-12-01 DIAGNOSIS — M2550 Pain in unspecified joint: Principal | ICD-10-CM

## 2019-12-01 MED ORDER — FERROUS SULFATE 325 MG (65 MG IRON) TABLET
ORAL_TABLET | Freq: Every day | ORAL | 3 refills | 30.00000 days | Status: CP
Start: 2019-12-01 — End: 2019-12-01
  Filled 2019-12-01: qty 30, 30d supply, fill #0

## 2019-12-01 MED ORDER — ERGOCALCIFEROL (VITAMIN D2) 1,250 MCG (50,000 UNIT) CAPSULE
ORAL_CAPSULE | ORAL | 3 refills | 28.00000 days | Status: CP
Start: 2019-12-01 — End: 2020-11-30
  Filled 2019-12-01: qty 4, 28d supply, fill #0

## 2019-12-01 MED ORDER — NAPROXEN 500 MG TABLET: 500 mg | tablet | Freq: Two times a day (BID) | 2 refills | 30 days | Status: AC

## 2019-12-01 MED ORDER — FERROUS SULFATE 325 MG (65 MG IRON) TABLET: 325 mg | tablet | Freq: Every day | 3 refills | 30 days | Status: AC

## 2019-12-01 MED ORDER — ERGOCALCIFEROL (VITAMIN D2) 1,250 MCG (50,000 UNIT) CAPSULE: 1250 ug | capsule | 3 refills | 28 days | Status: AC

## 2019-12-01 MED ORDER — NAPROXEN 500 MG TABLET
ORAL_TABLET | Freq: Two times a day (BID) | ORAL | 2 refills | 30.00000 days | Status: CP
Start: 2019-12-01 — End: 2020-11-30
  Filled 2019-12-01: qty 60, 30d supply, fill #0

## 2019-12-01 MED FILL — NAPROXEN 500 MG TABLET: 30 days supply | Qty: 60 | Fill #0 | Status: AC

## 2019-12-01 MED FILL — ERGOCALCIFEROL (VITAMIN D2) 1,250 MCG (50,000 UNIT) CAPSULE: 28 days supply | Qty: 4 | Fill #0 | Status: AC

## 2019-12-01 MED FILL — FEROSUL 325 MG (65 MG IRON) TABLET: 30 days supply | Qty: 30 | Fill #0 | Status: AC

## 2019-12-08 ENCOUNTER — Telehealth: Admit: 2019-12-08 | Discharge: 2019-12-09

## 2019-12-08 DIAGNOSIS — Z171 Estrogen receptor negative status [ER-]: Secondary | ICD-10-CM

## 2019-12-08 DIAGNOSIS — C50412 Malignant neoplasm of upper-outer quadrant of left female breast: Secondary | ICD-10-CM

## 2019-12-08 DIAGNOSIS — M2550 Pain in unspecified joint: Principal | ICD-10-CM

## 2019-12-08 DIAGNOSIS — F419 Anxiety disorder, unspecified: Principal | ICD-10-CM

## 2019-12-08 MED ORDER — GABAPENTIN 300 MG CAPSULE
ORAL_CAPSULE | Freq: Every evening | ORAL | 1 refills | 30.00000 days | Status: CP
Start: 2019-12-08 — End: ?

## 2019-12-15 ENCOUNTER — Telehealth: Admit: 2019-12-15 | Discharge: 2019-12-16

## 2019-12-15 DIAGNOSIS — R52 Pain, unspecified: Principal | ICD-10-CM

## 2019-12-21 DIAGNOSIS — M2550 Pain in unspecified joint: Principal | ICD-10-CM

## 2019-12-21 DIAGNOSIS — C50412 Malignant neoplasm of upper-outer quadrant of left female breast: Principal | ICD-10-CM

## 2019-12-21 DIAGNOSIS — Z171 Estrogen receptor negative status [ER-]: Principal | ICD-10-CM

## 2019-12-22 ENCOUNTER — Other Ambulatory Visit: Admit: 2019-12-22 | Discharge: 2019-12-23

## 2019-12-22 ENCOUNTER — Ambulatory Visit: Admit: 2019-12-22 | Discharge: 2019-12-23

## 2019-12-22 DIAGNOSIS — Z171 Estrogen receptor negative status [ER-]: Principal | ICD-10-CM

## 2019-12-22 DIAGNOSIS — C50412 Malignant neoplasm of upper-outer quadrant of left female breast: Principal | ICD-10-CM

## 2019-12-22 DIAGNOSIS — M2550 Pain in unspecified joint: Principal | ICD-10-CM

## 2019-12-22 MED ORDER — ONDANSETRON 4 MG DISINTEGRATING TABLET
ORAL_TABLET | Freq: Three times a day (TID) | ORAL | 1 refills | 10 days | Status: CP | PRN
Start: 2019-12-22 — End: ?
  Filled 2019-12-22: qty 30, 10d supply, fill #0

## 2019-12-22 MED FILL — ONDANSETRON 4 MG DISINTEGRATING TABLET: 10 days supply | Qty: 30 | Fill #0 | Status: AC

## 2020-01-12 ENCOUNTER — Ambulatory Visit: Admit: 2020-01-12 | Discharge: 2020-01-13 | Payer: MEDICAID

## 2020-01-12 ENCOUNTER — Other Ambulatory Visit: Admit: 2020-01-12 | Discharge: 2020-01-13 | Payer: MEDICAID

## 2020-01-12 DIAGNOSIS — Z554 Educational maladjustment and discord with teachers and classmates: Principal | ICD-10-CM

## 2020-01-12 DIAGNOSIS — C50412 Malignant neoplasm of upper-outer quadrant of left female breast: Principal | ICD-10-CM

## 2020-01-12 DIAGNOSIS — K625 Hemorrhage of anus and rectum: Principal | ICD-10-CM

## 2020-01-12 DIAGNOSIS — Z5111 Encounter for antineoplastic chemotherapy: Principal | ICD-10-CM

## 2020-01-12 DIAGNOSIS — C7981 Secondary malignant neoplasm of breast: Principal | ICD-10-CM

## 2020-01-12 DIAGNOSIS — Z791 Long term (current) use of non-steroidal anti-inflammatories (NSAID): Principal | ICD-10-CM

## 2020-01-12 DIAGNOSIS — J984 Other disorders of lung: Principal | ICD-10-CM

## 2020-01-12 DIAGNOSIS — F419 Anxiety disorder, unspecified: Principal | ICD-10-CM

## 2020-01-12 DIAGNOSIS — Z171 Estrogen receptor negative status [ER-]: Principal | ICD-10-CM

## 2020-01-12 DIAGNOSIS — Z9289 Personal history of other medical treatment: Principal | ICD-10-CM

## 2020-01-12 DIAGNOSIS — Z923 Personal history of irradiation: Principal | ICD-10-CM

## 2020-01-12 DIAGNOSIS — D509 Iron deficiency anemia, unspecified: Principal | ICD-10-CM

## 2020-01-12 DIAGNOSIS — R5383 Other fatigue: Principal | ICD-10-CM

## 2020-01-12 DIAGNOSIS — Z808 Family history of malignant neoplasm of other organs or systems: Principal | ICD-10-CM

## 2020-01-12 DIAGNOSIS — M2550 Pain in unspecified joint: Principal | ICD-10-CM

## 2020-01-12 DIAGNOSIS — R600 Localized edema: Principal | ICD-10-CM

## 2020-01-12 DIAGNOSIS — C7800 Secondary malignant neoplasm of unspecified lung: Principal | ICD-10-CM

## 2020-01-12 DIAGNOSIS — Z597 Insufficient social insurance and welfare support: Principal | ICD-10-CM

## 2020-01-12 DIAGNOSIS — Z9012 Acquired absence of left breast and nipple: Principal | ICD-10-CM

## 2020-01-12 DIAGNOSIS — R590 Localized enlarged lymph nodes: Principal | ICD-10-CM

## 2020-01-12 DIAGNOSIS — Z803 Family history of malignant neoplasm of breast: Principal | ICD-10-CM

## 2020-01-13 DIAGNOSIS — R52 Pain, unspecified: Principal | ICD-10-CM

## 2020-01-14 ENCOUNTER — Encounter
Admit: 2020-01-14 | Discharge: 2020-01-14 | Discharge status: Against Medical Advice | Payer: MEDICAID | Attending: Family

## 2020-01-14 DIAGNOSIS — Z5321 Procedure and treatment not carried out due to patient leaving prior to being seen by health care provider: Principal | ICD-10-CM

## 2020-01-14 DIAGNOSIS — R509 Fever, unspecified: Principal | ICD-10-CM

## 2020-01-28 ENCOUNTER — Ambulatory Visit: Admit: 2020-01-28 | Discharge: 2020-02-10

## 2020-01-28 ENCOUNTER — Ambulatory Visit: Admit: 2020-01-28 | Discharge: 2020-01-29 | Payer: MEDICAID

## 2020-01-28 DIAGNOSIS — Z171 Estrogen receptor negative status [ER-]: Principal | ICD-10-CM

## 2020-01-28 DIAGNOSIS — C50412 Malignant neoplasm of upper-outer quadrant of left female breast: Principal | ICD-10-CM

## 2020-01-28 DIAGNOSIS — C7800 Secondary malignant neoplasm of unspecified lung: Principal | ICD-10-CM

## 2020-01-28 DIAGNOSIS — C50919 Malignant neoplasm of unspecified site of unspecified female breast: Principal | ICD-10-CM

## 2020-01-28 DIAGNOSIS — C799 Secondary malignant neoplasm of unspecified site: Principal | ICD-10-CM

## 2020-02-08 DIAGNOSIS — C50412 Malignant neoplasm of upper-outer quadrant of left female breast: Principal | ICD-10-CM

## 2020-02-08 DIAGNOSIS — Z171 Estrogen receptor negative status [ER-]: Principal | ICD-10-CM

## 2020-02-10 ENCOUNTER — Ambulatory Visit: Admit: 2020-02-10 | Discharge: 2020-02-11 | Payer: MEDICAID

## 2020-02-10 ENCOUNTER — Other Ambulatory Visit: Admit: 2020-02-10 | Discharge: 2020-02-11 | Payer: MEDICAID

## 2020-02-10 DIAGNOSIS — R042 Hemoptysis: Principal | ICD-10-CM

## 2020-02-10 DIAGNOSIS — R112 Nausea with vomiting, unspecified: Principal | ICD-10-CM

## 2020-02-10 DIAGNOSIS — Z597 Insufficient social insurance and welfare support: Principal | ICD-10-CM

## 2020-02-10 DIAGNOSIS — C7981 Secondary malignant neoplasm of breast: Principal | ICD-10-CM

## 2020-02-10 DIAGNOSIS — Z554 Educational maladjustment and discord with teachers and classmates: Principal | ICD-10-CM

## 2020-02-10 DIAGNOSIS — R5383 Other fatigue: Principal | ICD-10-CM

## 2020-02-10 DIAGNOSIS — Z5111 Encounter for antineoplastic chemotherapy: Principal | ICD-10-CM

## 2020-02-10 DIAGNOSIS — Z171 Estrogen receptor negative status [ER-]: Principal | ICD-10-CM

## 2020-02-10 DIAGNOSIS — M2550 Pain in unspecified joint: Principal | ICD-10-CM

## 2020-02-10 DIAGNOSIS — Z170 Estrogen receptor positive status [ER+]: Principal | ICD-10-CM

## 2020-02-10 DIAGNOSIS — Z30430 Encounter for insertion of intrauterine contraceptive device: Principal | ICD-10-CM

## 2020-02-10 DIAGNOSIS — Z9289 Personal history of other medical treatment: Principal | ICD-10-CM

## 2020-02-10 DIAGNOSIS — Z923 Personal history of irradiation: Principal | ICD-10-CM

## 2020-02-10 DIAGNOSIS — C50412 Malignant neoplasm of upper-outer quadrant of left female breast: Principal | ICD-10-CM

## 2020-02-10 DIAGNOSIS — C7800 Secondary malignant neoplasm of unspecified lung: Principal | ICD-10-CM

## 2020-02-10 DIAGNOSIS — R509 Fever, unspecified: Principal | ICD-10-CM

## 2020-02-10 DIAGNOSIS — D509 Iron deficiency anemia, unspecified: Principal | ICD-10-CM

## 2020-02-10 DIAGNOSIS — R600 Localized edema: Principal | ICD-10-CM

## 2020-02-10 DIAGNOSIS — F419 Anxiety disorder, unspecified: Principal | ICD-10-CM

## 2020-02-10 MED ORDER — ONDANSETRON 4 MG DISINTEGRATING TABLET
ORAL_TABLET | Freq: Three times a day (TID) | ORAL | 1 refills | 10 days | Status: CP | PRN
Start: 2020-02-10 — End: ?
  Filled 2020-02-10: qty 30, 10d supply, fill #0

## 2020-02-10 MED ORDER — PANTOPRAZOLE 40 MG TABLET,DELAYED RELEASE
ORAL_TABLET | Freq: Every day | ORAL | 0 refills | 90.00000 days | Status: CP
Start: 2020-02-10 — End: 2020-03-01
  Filled 2020-02-10: qty 30, 30d supply, fill #0

## 2020-02-10 MED ORDER — NAPROXEN 500 MG TABLET
ORAL_TABLET | Freq: Two times a day (BID) | ORAL | 2 refills | 30.00000 days | Status: CP
Start: 2020-02-10 — End: 2021-02-09
  Filled 2020-02-10: qty 60, 30d supply, fill #0

## 2020-02-10 MED FILL — PANTOPRAZOLE 40 MG TABLET,DELAYED RELEASE: 30 days supply | Qty: 30 | Fill #0 | Status: AC

## 2020-02-10 MED FILL — ONDANSETRON 4 MG DISINTEGRATING TABLET: 10 days supply | Qty: 30 | Fill #0 | Status: AC

## 2020-02-10 MED FILL — NAPROXEN 500 MG TABLET: 30 days supply | Qty: 60 | Fill #0 | Status: AC

## 2020-02-26 DIAGNOSIS — C50412 Malignant neoplasm of upper-outer quadrant of left female breast: Principal | ICD-10-CM

## 2020-02-26 DIAGNOSIS — Z171 Estrogen receptor negative status [ER-]: Principal | ICD-10-CM

## 2020-03-01 ENCOUNTER — Ambulatory Visit: Admit: 2020-03-01 | Discharge: 2020-03-02 | Payer: MEDICAID

## 2020-03-01 ENCOUNTER — Other Ambulatory Visit: Admit: 2020-03-01 | Discharge: 2020-03-02 | Payer: MEDICAID

## 2020-03-01 DIAGNOSIS — D509 Iron deficiency anemia, unspecified: Principal | ICD-10-CM

## 2020-03-01 DIAGNOSIS — R600 Localized edema: Principal | ICD-10-CM

## 2020-03-01 DIAGNOSIS — Z9012 Acquired absence of left breast and nipple: Principal | ICD-10-CM

## 2020-03-01 DIAGNOSIS — Z170 Estrogen receptor positive status [ER+]: Principal | ICD-10-CM

## 2020-03-01 DIAGNOSIS — Z923 Personal history of irradiation: Principal | ICD-10-CM

## 2020-03-01 DIAGNOSIS — Z8616 Personal history of COVID-19: Principal | ICD-10-CM

## 2020-03-01 DIAGNOSIS — C50412 Malignant neoplasm of upper-outer quadrant of left female breast: Principal | ICD-10-CM

## 2020-03-01 DIAGNOSIS — M2550 Pain in unspecified joint: Principal | ICD-10-CM

## 2020-03-01 DIAGNOSIS — Z171 Estrogen receptor negative status [ER-]: Principal | ICD-10-CM

## 2020-03-01 DIAGNOSIS — C7802 Secondary malignant neoplasm of left lung: Principal | ICD-10-CM

## 2020-03-01 DIAGNOSIS — Z5111 Encounter for antineoplastic chemotherapy: Principal | ICD-10-CM

## 2020-03-01 DIAGNOSIS — C7801 Secondary malignant neoplasm of right lung: Principal | ICD-10-CM

## 2020-03-01 DIAGNOSIS — C50919 Malignant neoplasm of unspecified site of unspecified female breast: Principal | ICD-10-CM

## 2020-03-01 DIAGNOSIS — R519 Acute nonintractable headache, unspecified headache type: Principal | ICD-10-CM

## 2020-03-01 MED ORDER — PANTOPRAZOLE 40 MG TABLET,DELAYED RELEASE
ORAL_TABLET | Freq: Every day | ORAL | 3 refills | 90 days | Status: CP
Start: 2020-03-01 — End: ?

## 2020-03-01 MED ORDER — PROCHLORPERAZINE MALEATE 10 MG TABLET
ORAL_TABLET | Freq: Four times a day (QID) | ORAL | 2 refills | 8 days | Status: CP | PRN
Start: 2020-03-01 — End: 2020-04-12
  Filled 2020-03-01: qty 30, 8d supply, fill #0

## 2020-03-01 MED FILL — PROCHLORPERAZINE MALEATE 10 MG TABLET: 8 days supply | Qty: 30 | Fill #0 | Status: AC

## 2020-03-01 MED FILL — ONDANSETRON 4 MG DISINTEGRATING TABLET: ORAL | 10 days supply | Qty: 30 | Fill #1

## 2020-03-01 MED FILL — ONDANSETRON 4 MG DISINTEGRATING TABLET: 10 days supply | Qty: 30 | Fill #1 | Status: AC

## 2020-03-04 ENCOUNTER — Telehealth: Admit: 2020-03-04 | Discharge: 2020-03-05

## 2020-03-04 DIAGNOSIS — G4489 Other headache syndrome: Principal | ICD-10-CM

## 2020-03-04 DIAGNOSIS — T451X5A Adverse effect of antineoplastic and immunosuppressive drugs, initial encounter: Principal | ICD-10-CM

## 2020-03-04 DIAGNOSIS — C50919 Malignant neoplasm of unspecified site of unspecified female breast: Principal | ICD-10-CM

## 2020-03-04 DIAGNOSIS — R110 Nausea: Principal | ICD-10-CM

## 2020-03-15 MED FILL — NAPROXEN 500 MG TABLET: ORAL | 30 days supply | Qty: 60 | Fill #0

## 2020-03-17 DIAGNOSIS — Z171 Estrogen receptor negative status [ER-]: Principal | ICD-10-CM

## 2020-03-17 DIAGNOSIS — C50412 Malignant neoplasm of upper-outer quadrant of left female breast: Principal | ICD-10-CM

## 2020-03-29 MED FILL — BENZONATATE 100 MG CAPSULE: ORAL | 20 days supply | Qty: 60 | Fill #1

## 2020-04-03 DIAGNOSIS — Z171 Estrogen receptor negative status [ER-]: Principal | ICD-10-CM

## 2020-04-03 DIAGNOSIS — C7801 Secondary malignant neoplasm of right lung: Principal | ICD-10-CM

## 2020-04-03 DIAGNOSIS — C7802 Secondary malignant neoplasm of left lung: Principal | ICD-10-CM

## 2020-04-03 DIAGNOSIS — C50412 Malignant neoplasm of upper-outer quadrant of left female breast: Principal | ICD-10-CM

## 2020-04-12 ENCOUNTER — Ambulatory Visit: Admit: 2020-04-12 | Discharge: 2020-04-13 | Payer: MEDICAID

## 2020-04-12 ENCOUNTER — Other Ambulatory Visit: Admit: 2020-04-12 | Discharge: 2020-04-13 | Payer: MEDICAID

## 2020-04-12 DIAGNOSIS — T451X5A Adverse effect of antineoplastic and immunosuppressive drugs, initial encounter: Principal | ICD-10-CM

## 2020-04-12 DIAGNOSIS — F3341 Major depressive disorder, recurrent, in partial remission: Principal | ICD-10-CM

## 2020-04-12 DIAGNOSIS — F419 Anxiety disorder, unspecified: Principal | ICD-10-CM

## 2020-04-12 DIAGNOSIS — G4440 Drug-induced headache, not elsewhere classified, not intractable: Principal | ICD-10-CM

## 2020-04-12 DIAGNOSIS — R590 Localized enlarged lymph nodes: Principal | ICD-10-CM

## 2020-04-12 DIAGNOSIS — C7800 Secondary malignant neoplasm of unspecified lung: Principal | ICD-10-CM

## 2020-04-12 DIAGNOSIS — Z9221 Personal history of antineoplastic chemotherapy: Principal | ICD-10-CM

## 2020-04-12 DIAGNOSIS — Z808 Family history of malignant neoplasm of other organs or systems: Principal | ICD-10-CM

## 2020-04-12 DIAGNOSIS — R110 Nausea: Principal | ICD-10-CM

## 2020-04-12 DIAGNOSIS — G893 Neoplasm related pain (acute) (chronic): Principal | ICD-10-CM

## 2020-04-12 DIAGNOSIS — R269 Unspecified abnormalities of gait and mobility: Principal | ICD-10-CM

## 2020-04-12 DIAGNOSIS — Z923 Personal history of irradiation: Principal | ICD-10-CM

## 2020-04-12 DIAGNOSIS — Z79899 Other long term (current) drug therapy: Principal | ICD-10-CM

## 2020-04-12 DIAGNOSIS — Z880 Allergy status to penicillin: Principal | ICD-10-CM

## 2020-04-12 DIAGNOSIS — R5381 Other malaise: Principal | ICD-10-CM

## 2020-04-12 DIAGNOSIS — F32A Depression, unspecified: Principal | ICD-10-CM

## 2020-04-12 DIAGNOSIS — Z791 Long term (current) use of non-steroidal anti-inflammatories (NSAID): Principal | ICD-10-CM

## 2020-04-12 DIAGNOSIS — Z171 Estrogen receptor negative status [ER-]: Principal | ICD-10-CM

## 2020-04-12 DIAGNOSIS — Z5111 Encounter for antineoplastic chemotherapy: Principal | ICD-10-CM

## 2020-04-12 DIAGNOSIS — R059 Cough, unspecified: Principal | ICD-10-CM

## 2020-04-12 DIAGNOSIS — R600 Localized edema: Principal | ICD-10-CM

## 2020-04-12 DIAGNOSIS — M2550 Pain in unspecified joint: Principal | ICD-10-CM

## 2020-04-12 DIAGNOSIS — G4489 Other headache syndrome: Principal | ICD-10-CM

## 2020-04-12 DIAGNOSIS — C50412 Malignant neoplasm of upper-outer quadrant of left female breast: Principal | ICD-10-CM

## 2020-04-12 DIAGNOSIS — Z8616 Personal history of COVID-19: Principal | ICD-10-CM

## 2020-04-12 DIAGNOSIS — Z515 Encounter for palliative care: Principal | ICD-10-CM

## 2020-04-12 DIAGNOSIS — Z886 Allergy status to analgesic agent status: Principal | ICD-10-CM

## 2020-04-12 MED ORDER — NAPROXEN 500 MG TABLET
ORAL_TABLET | Freq: Two times a day (BID) | ORAL | 2 refills | 30.00000 days | Status: CP
Start: 2020-04-12 — End: 2021-04-12

## 2020-04-12 MED ORDER — PROCHLORPERAZINE MALEATE 10 MG TABLET
ORAL_TABLET | Freq: Four times a day (QID) | ORAL | 2 refills | 8 days | Status: CP | PRN
Start: 2020-04-12 — End: ?

## 2020-04-12 MED ORDER — LIDOCAINE-PRILOCAINE 2.5 %-2.5 % TOPICAL CREAM
Freq: Once | TOPICAL | 0 refills | 0.00000 days | Status: CP | PRN
Start: 2020-04-12 — End: 2020-04-12

## 2020-04-12 MED ORDER — SERTRALINE 100 MG TABLET
ORAL_TABLET | Freq: Every day | ORAL | 2 refills | 30 days | Status: CP
Start: 2020-04-12 — End: 2021-04-12

## 2020-04-19 ENCOUNTER — Ambulatory Visit: Admit: 2020-04-19 | Discharge: 2020-05-18 | Payer: MEDICAID

## 2020-04-20 DIAGNOSIS — C50412 Malignant neoplasm of upper-outer quadrant of left female breast: Principal | ICD-10-CM

## 2020-04-20 DIAGNOSIS — Z9889 Other specified postprocedural states: Principal | ICD-10-CM

## 2020-04-20 DIAGNOSIS — Z171 Estrogen receptor negative status [ER-]: Principal | ICD-10-CM

## 2020-04-20 DIAGNOSIS — R600 Localized edema: Principal | ICD-10-CM

## 2020-05-03 ENCOUNTER — Ambulatory Visit: Admit: 2020-05-03 | Discharge: 2020-05-04 | Payer: MEDICAID

## 2020-05-03 ENCOUNTER — Other Ambulatory Visit: Admit: 2020-05-03 | Discharge: 2020-05-04 | Payer: MEDICAID

## 2020-05-03 DIAGNOSIS — Z171 Estrogen receptor negative status [ER-]: Principal | ICD-10-CM

## 2020-05-03 DIAGNOSIS — C50412 Malignant neoplasm of upper-outer quadrant of left female breast: Principal | ICD-10-CM

## 2020-05-03 MED ORDER — NAPROXEN 500 MG TABLET
ORAL_TABLET | Freq: Two times a day (BID) | ORAL | 2 refills | 30 days | Status: CP
Start: 2020-05-03 — End: 2021-05-03

## 2020-05-03 MED ORDER — PROCHLORPERAZINE MALEATE 10 MG TABLET
ORAL_TABLET | Freq: Four times a day (QID) | ORAL | 2 refills | 8 days | Status: CP | PRN
Start: 2020-05-03 — End: ?

## 2020-05-05 ENCOUNTER — Ambulatory Visit: Admit: 2020-05-05 | Payer: MEDICAID

## 2020-05-06 DIAGNOSIS — E559 Vitamin D deficiency, unspecified: Principal | ICD-10-CM

## 2020-05-06 DIAGNOSIS — Z171 Estrogen receptor negative status [ER-]: Principal | ICD-10-CM

## 2020-05-06 DIAGNOSIS — C50412 Malignant neoplasm of upper-outer quadrant of left female breast: Principal | ICD-10-CM

## 2020-05-06 DIAGNOSIS — R110 Nausea: Principal | ICD-10-CM

## 2020-05-06 MED ORDER — ERGOCALCIFEROL (VITAMIN D2) 1,250 MCG (50,000 UNIT) CAPSULE
ORAL_CAPSULE | ORAL | 1 refills | 28 days | Status: CP
Start: 2020-05-06 — End: 2021-05-06

## 2020-05-06 MED ORDER — DEXAMETHASONE 2 MG TABLET
ORAL_TABLET | Freq: Every day | ORAL | 0 refills | 10 days | Status: CP | PRN
Start: 2020-05-06 — End: 2020-05-16

## 2020-05-08 ENCOUNTER — Ambulatory Visit: Admit: 2020-05-08 | Payer: MEDICAID

## 2020-05-09 ENCOUNTER — Ambulatory Visit: Admit: 2020-05-09 | Payer: MEDICAID

## 2020-05-12 DIAGNOSIS — C50412 Malignant neoplasm of upper-outer quadrant of left female breast: Principal | ICD-10-CM

## 2020-05-12 DIAGNOSIS — Z171 Estrogen receptor negative status [ER-]: Principal | ICD-10-CM

## 2020-05-17 ENCOUNTER — Ambulatory Visit: Admit: 2020-05-17 | Discharge: 2020-05-30 | Payer: MEDICAID

## 2020-05-24 ENCOUNTER — Ambulatory Visit: Admit: 2020-05-24 | Discharge: 2020-05-24 | Payer: MEDICAID

## 2020-05-24 ENCOUNTER — Other Ambulatory Visit: Admit: 2020-05-24 | Discharge: 2020-05-24 | Payer: MEDICAID

## 2020-05-24 ENCOUNTER — Ambulatory Visit: Admit: 2020-05-24 | Discharge: 2020-05-24 | Payer: MEDICAID | Attending: Medical Oncology | Primary: Medical Oncology

## 2020-05-24 ENCOUNTER — Ambulatory Visit: Admit: 2020-05-24 | Discharge: 2020-05-24 | Payer: MEDICAID | Attending: Registered" | Primary: Registered"

## 2020-05-24 DIAGNOSIS — C50412 Malignant neoplasm of upper-outer quadrant of left female breast: Principal | ICD-10-CM

## 2020-05-24 DIAGNOSIS — Z171 Estrogen receptor negative status [ER-]: Principal | ICD-10-CM

## 2020-05-24 DIAGNOSIS — F3341 Major depressive disorder, recurrent, in partial remission: Principal | ICD-10-CM

## 2020-05-24 MED ORDER — PANTOPRAZOLE 40 MG TABLET,DELAYED RELEASE
ORAL_TABLET | Freq: Every day | ORAL | 3 refills | 90 days | Status: CP
Start: 2020-05-24 — End: ?
  Filled 2020-05-24: qty 30, 30d supply, fill #0

## 2020-05-24 MED ORDER — SERTRALINE 100 MG TABLET
ORAL_TABLET | Freq: Every day | ORAL | 2 refills | 30 days | Status: CP
Start: 2020-05-24 — End: 2021-05-24
  Filled 2020-05-24: qty 60, 30d supply, fill #0

## 2020-05-26 DIAGNOSIS — C7800 Secondary malignant neoplasm of unspecified lung: Principal | ICD-10-CM

## 2020-05-26 DIAGNOSIS — C50919 Malignant neoplasm of unspecified site of unspecified female breast: Principal | ICD-10-CM

## 2020-05-26 MED ORDER — OLAPARIB 150 MG TABLET
ORAL_CAPSULE | Freq: Two times a day (BID) | ORAL | 5 refills | 0 days | Status: CP
Start: 2020-05-26 — End: ?

## 2020-05-31 DIAGNOSIS — C50412 Malignant neoplasm of upper-outer quadrant of left female breast: Principal | ICD-10-CM

## 2020-05-31 DIAGNOSIS — Z171 Estrogen receptor negative status [ER-]: Principal | ICD-10-CM

## 2020-05-31 DIAGNOSIS — M2550 Pain in unspecified joint: Principal | ICD-10-CM

## 2020-05-31 MED ORDER — NAPROXEN 500 MG TABLET
ORAL_TABLET | Freq: Two times a day (BID) | ORAL | 2 refills | 30 days | Status: CP
Start: 2020-05-31 — End: 2021-05-31
  Filled 2020-06-10: qty 60, 30d supply, fill #0

## 2020-06-08 ENCOUNTER — Ambulatory Visit: Admit: 2020-06-08 | Payer: MEDICAID

## 2020-06-08 ENCOUNTER — Ambulatory Visit
Admit: 2020-06-08 | Payer: MEDICAID | Attending: Rehabilitative and Restorative Service Providers" | Primary: Rehabilitative and Restorative Service Providers"

## 2020-06-14 DIAGNOSIS — Z171 Estrogen receptor negative status [ER-]: Principal | ICD-10-CM

## 2020-06-14 DIAGNOSIS — C50412 Malignant neoplasm of upper-outer quadrant of left female breast: Principal | ICD-10-CM

## 2020-06-14 MED ORDER — PROCHLORPERAZINE MALEATE 10 MG TABLET
ORAL_TABLET | Freq: Four times a day (QID) | ORAL | 2 refills | 8 days | Status: CP | PRN
Start: 2020-06-14 — End: ?
  Filled 2020-06-22: qty 30, 8d supply, fill #0

## 2020-06-16 DIAGNOSIS — C50412 Malignant neoplasm of upper-outer quadrant of left female breast: Principal | ICD-10-CM

## 2020-06-16 DIAGNOSIS — Z171 Estrogen receptor negative status [ER-]: Principal | ICD-10-CM

## 2020-06-17 MED FILL — SERTRALINE 100 MG TABLET: ORAL | 30 days supply | Qty: 60 | Fill #0

## 2020-07-14 MED FILL — NAPROXEN 500 MG TABLET: ORAL | 30 days supply | Qty: 60 | Fill #1

## 2020-07-15 ENCOUNTER — Institutional Professional Consult (permissible substitution): Admit: 2020-07-15 | Discharge: 2020-07-15 | Payer: MEDICAID

## 2020-07-15 ENCOUNTER — Ambulatory Visit: Admit: 2020-07-15 | Discharge: 2020-07-15 | Payer: MEDICAID

## 2020-07-15 DIAGNOSIS — Z171 Estrogen receptor negative status [ER-]: Principal | ICD-10-CM

## 2020-07-15 DIAGNOSIS — C50412 Malignant neoplasm of upper-outer quadrant of left female breast: Principal | ICD-10-CM

## 2020-07-15 DIAGNOSIS — R748 Abnormal levels of other serum enzymes: Principal | ICD-10-CM

## 2020-07-15 DIAGNOSIS — R110 Nausea: Principal | ICD-10-CM

## 2020-07-15 MED ORDER — METHYLPREDNISOLONE 4 MG TABLETS IN A DOSE PACK
0 refills | 0 days | Status: CP
Start: 2020-07-15 — End: ?

## 2020-07-19 ENCOUNTER — Telehealth: Admit: 2020-07-19 | Discharge: 2020-07-20 | Payer: MEDICAID

## 2020-08-05 ENCOUNTER — Ambulatory Visit: Admit: 2020-08-05 | Discharge: 2020-08-06 | Payer: MEDICAID

## 2020-08-05 DIAGNOSIS — C50412 Malignant neoplasm of upper-outer quadrant of left female breast: Principal | ICD-10-CM

## 2020-08-05 DIAGNOSIS — Z171 Estrogen receptor negative status [ER-]: Principal | ICD-10-CM

## 2020-08-08 MED FILL — PROCHLORPERAZINE MALEATE 10 MG TABLET: ORAL | 8 days supply | Qty: 30 | Fill #1

## 2020-08-12 ENCOUNTER — Ambulatory Visit: Admit: 2020-08-12 | Discharge: 2020-08-13 | Payer: MEDICAID

## 2020-08-12 ENCOUNTER — Institutional Professional Consult (permissible substitution): Admit: 2020-08-12 | Discharge: 2020-08-13 | Payer: MEDICAID

## 2020-08-12 DIAGNOSIS — C50412 Malignant neoplasm of upper-outer quadrant of left female breast: Principal | ICD-10-CM

## 2020-08-12 DIAGNOSIS — Z171 Estrogen receptor negative status [ER-]: Principal | ICD-10-CM

## 2020-08-12 DIAGNOSIS — R5383 Other fatigue: Principal | ICD-10-CM

## 2020-08-15 MED FILL — SERTRALINE 100 MG TABLET: ORAL | 30 days supply | Qty: 60 | Fill #1

## 2020-08-15 MED FILL — NAPROXEN 500 MG TABLET: ORAL | 30 days supply | Qty: 60 | Fill #2

## 2020-08-25 DIAGNOSIS — E559 Vitamin D deficiency, unspecified: Principal | ICD-10-CM

## 2020-08-25 DIAGNOSIS — C50412 Malignant neoplasm of upper-outer quadrant of left female breast: Principal | ICD-10-CM

## 2020-08-25 DIAGNOSIS — Z171 Estrogen receptor negative status [ER-]: Principal | ICD-10-CM

## 2020-08-25 MED ORDER — ERGOCALCIFEROL (VITAMIN D2) 1,250 MCG (50,000 UNIT) CAPSULE
ORAL_CAPSULE | ORAL | 2 refills | 28 days | Status: CP
Start: 2020-08-25 — End: 2021-08-25

## 2020-08-25 MED ORDER — METHYLPREDNISOLONE 4 MG TABLETS IN A DOSE PACK
0 refills | 0 days | Status: CP
Start: 2020-08-25 — End: ?

## 2020-09-05 MED ORDER — PREDNISONE 5 MG TABLET
ORAL_TABLET | Freq: Every day | ORAL | 1 refills | 30 days | Status: CP
Start: 2020-09-05 — End: ?

## 2020-09-08 ENCOUNTER — Institutional Professional Consult (permissible substitution): Admit: 2020-09-08 | Discharge: 2020-09-09 | Payer: MEDICAID

## 2020-09-08 ENCOUNTER — Ambulatory Visit: Admit: 2020-09-08 | Discharge: 2020-09-19 | Payer: MEDICAID

## 2020-09-08 ENCOUNTER — Ambulatory Visit: Admit: 2020-09-08 | Discharge: 2020-09-09 | Payer: MEDICAID

## 2020-09-08 DIAGNOSIS — C50412 Malignant neoplasm of upper-outer quadrant of left female breast: Principal | ICD-10-CM

## 2020-09-08 DIAGNOSIS — Z171 Estrogen receptor negative status [ER-]: Principal | ICD-10-CM

## 2020-09-09 ENCOUNTER — Ambulatory Visit: Admit: 2020-09-09 | Discharge: 2020-09-10 | Payer: MEDICAID

## 2020-09-09 DIAGNOSIS — M25542 Pain in joints of left hand: Principal | ICD-10-CM

## 2020-09-09 DIAGNOSIS — C50412 Malignant neoplasm of upper-outer quadrant of left female breast: Principal | ICD-10-CM

## 2020-09-09 DIAGNOSIS — Z171 Estrogen receptor negative status [ER-]: Principal | ICD-10-CM

## 2020-09-09 DIAGNOSIS — M25541 Pain in joints of right hand: Principal | ICD-10-CM

## 2020-09-09 DIAGNOSIS — M2550 Pain in unspecified joint: Principal | ICD-10-CM

## 2020-09-09 DIAGNOSIS — M2540 Effusion, unspecified joint: Principal | ICD-10-CM
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
## 2020-09-12 MED ORDER — CAPECITABINE 500 MG TABLET
ORAL_TABLET | Freq: Two times a day (BID) | ORAL | 0 refills | 14.00000 days | Status: CP
Start: 2020-09-12 — End: 2020-09-26
  Filled 2020-09-15: qty 112, 21d supply, fill #0

## 2020-09-12 NOTE — Unmapped (Signed)
Addended by: Donzetta Kohut on: 09/12/2020 10:13 AM     Modules accepted: Orders

## 2020-09-12 NOTE — Unmapped (Signed)
[facility] Specialty Medication Onboarding    Specialty Medication: Capecitabine 500mg tablets  Prior Authorization: Not Required   Financial Assistance: No - copay  <$25  Final Copay/Day Supply: $0 / 21 days    Insurance Restrictions: None     Notes to Pharmacist:     The triage team has completed the benefits investigation and has determined that the patient is able to fill this medication at [facility]. Please contact the patient to complete the onboarding or follow up with the prescribing physician as needed.

## 2020-09-12 NOTE — Unmapped (Signed)
Appt R/s'd into Urgent clinic on 8/18    Thanks

## 2020-09-13 ENCOUNTER — Ambulatory Visit: Admit: 2020-09-13 | Discharge: 2020-09-14 | Payer: MEDICAID

## 2020-09-13 MED ORDER — BENZONATATE 100 MG CAPSULE
ORAL_CAPSULE | Freq: Three times a day (TID) | ORAL | 1 refills | 20 days | Status: CP | PRN
Start: 2020-09-13 — End: 2021-09-13

## 2020-09-13 MED ORDER — OXYCODONE 15 MG TABLET
ORAL_TABLET | 0 refills | 0 days | Status: CP
Start: 2020-09-13 — End: ?

## 2020-09-13 MED ORDER — SENNOSIDES 8.6 MG TABLET
ORAL_TABLET | Freq: Two times a day (BID) | ORAL | 11 refills | 30.00000 days | Status: CP | PRN
Start: 2020-09-13 — End: 2021-09-13

## 2020-09-13 MED ORDER — POLYETHYLENE GLYCOL 3350 17 GRAM/DOSE ORAL POWDER
0 refills | 0 days | Status: CP
Start: 2020-09-13 — End: ?

## 2020-09-13 MED ORDER — FAMOTIDINE 20 MG TABLET
ORAL_TABLET | Freq: Two times a day (BID) | ORAL | 11 refills | 30 days | Status: CP
Start: 2020-09-13 — End: 2021-09-13

## 2020-09-13 NOTE — Unmapped (Signed)
OUTPATIENT ONCOLOGY PALLIATIVE CARE    Principal Diagnosis: Sheri [name] is a 25 y.o. female with metastatic L breast cancer, diagnosed in October 2019. Disease sites include breast and both lungs.     Assessment/Plan:   1. Chemotherapy-associated and ?cancer-associated pain: described as primarily affecting her arms and legs, dull, throbbing, and sometimes shooting, includes the joints, responds well to Tylenol+Naproxen but short duration of effectiveness and has become more painful in prior weeks, responds to opioid medications that were offered by cousin who has a chronic muscle condition. We will initiate opioid medications today and discussed the importance of using only what is prescribed, keeping track of doses used (number per day and specific dose needed), not allowing others access to the medications, not using any other opioid or non-opioid medications prescribed to others, communicating about changes in pain needs other other substances being used outside of what has been prescribed.  -- Continue Tylenol and Naproxen  -- START oxycodone 15-30mg  q4-6h PRN pain  -- Keep notebook of doses/times/pain characteristics, discussed apps like iHeadache that can be helpful for tracking pain   -- No longer taking gabapentin because was not effective  -- Rx Miralax and Senna, discussed titrating to one soft BM daily while on opioid medications    2. Chemo-associated headaches: resolved on new antiemetic regimen without Zofran  -- Continue to monitor    3. Cough: stable; likely related to lung metastases, responds to Tessalon, also exacerbated by reflux  -- Tessalon 100-200mg mg TID PRN, refilled today  -- Famotidine 20mg  BID, Rx sent today  -- May respond to oxycodone as above  -- Continue to monitor for changes    4. Depression and anxiety: had been self-medicating with Xanax, not currently using as dealers were arrested and jailed, has tried Ativan (effective) and Klonopin (ineffective, seemed to heighten anxiety/panic episodes)  -- Continue sertraline 200mg  daily  -- May use Ativan 1mg  daily PRN, track doses and will discuss in 1 week  -- Consider olanzapine at future date, possibly other adjuncts to help with lack of motivation    5. Nausea: occurs with chemotherapy, improved with most recent modifications  -- Continue Compazine PRN  -- Avoid Zofran as was contributing to headaches    6. Mobility issues: getting out of bed and getting dressed has been burdensome, awaiting PT/OT sessions    7. GOC: did not discuss in detail today; looking forward to saving up for her own rental home.     # Controlled substances risk management.   - Patient has a signed pain medication agreement with Outpatient Palliative Care, completed on 09/13/20, as per standard of care. Pain contract discussed in detail, including her high risk status and our inability to prescribe if any future red flags. She uses a paisley medicine bag for Zoloft, Naproxen, Tylenol, heartburn medication etc. and will use this bag for opioids and keep it in her presence at all times.   - [place] database was reviewed today and it was appropriate. No benzodiazapine prescriptions since 2020   - Urine drug screen was not performed at this visit. Findings: not applicable.   - Patient has received information about safe storage and administration of medications.   - Patient has received a prescription for narcan; was not provided specific education, and no caregiver was present.       F/u: WEEKLY    ----------------------------------------  Referring Provider: Dr Avis Epley  Oncology Team:   Medical Oncologist: Dr. Marc Morgans / Louann Liv  Surgical Oncologist: Dr.  [name]   Radiation Oncologist: Dr. Zacarias Pontes   PCP: No PCP Per Patient      HPI: Sheri [name] is a 25 y.o. woman with left breast cancer metastatic to lungs. She was initially diagnosed with local disease in October 2019 and completed chemotherapy in March 2020. Was found to have residual disease at time of mastectomy in April 2020 and again at time of reconstruction, and additional surgical resection was required. Restaging scans in August 2020 did not reveal metastatic disease, and she completed adjuvant radiation in December 2020. She was scheduled to continue capecitabine after radiation but due to a complicated psychosocial situation this did not occur. She was found to have metastatic disease in August 2021 when she presented with hemoptysis and cough and was found to have bilateral lung involvement.     Current cancer-directed therapy: planning to start Xeloda    Interval events (Sheri [name]), 09/13/20:  -- Pain described as all over my body and dull/throbbing, 8+/10 at its worst, will lie in bed crying when unable to capture it with current regimen  -- Will schedule meals around med schedule, because her medications are more effective on an empty stomach  -- Has tried percocet and morphine (most recently on Saturday), will take the pain down to 2/10 but only when taking 30mg  oxycodone dose  -- No more headaches, possibly from Zofran and/or prior chemo  -- Anxiety has been fluctuating, worse last week after scans/results, taking Ativan from prior prescription, has approximately 6 tablets left, thinks they are 1mg  dose  -- Still on a stable dose of Zoloft 200mg  which helps with baseline depression/anxiety, but her pain has been exacerbating her depression  -- Using a sleep aid (diphenhydramine), did not know it was a Benadryl equivalent  -- Been living with her mom for past several weeks brother there too but not much interaction, will stay with friend Sheri [name] for 1-2 weeks at a time and remain on good terms  -- Mom has alcoholism and paranoia, tends to fixate on certain topics, worse when it's focused on Sheri Kott herself, currently not an active issue for past few weeks  -- Talked about therapy as not having a lasting benefit but open to setting up a more stable counseling plan with Sheri [name] if it can build in some flexibility based on her pain and mobility issues       Symptom Review (January 2022):  General: doing poorly overall, has had some debilitating pain in preceding weeks, unclear etiology  Pain: full-body pain has been worse in last few weeks, will improve in week or so after chemo but then become worse, described as pain in wrists and ankles, also mid/upper back  Headaches: resolved, were present with last 3 cycles, frontal in nature and very intense, would occur 48-72 hours after tx but most recently occurred immediately following treatment. No response to Naproxen/Tylenol, did seem to respond to acetaminophen/oxycodone.  Fatigue: worse  Mobility: worse, has had discomfort with movement  Sleep: improved quality, will sleep all day after chemo  Appetite: fluctuates with pain  Nausea: present with and after, will take antiemetic meds for 1-2 days post chemo  Bowel function: regular, no current issues with constipation/hemorrhoids/bleeding  Dyspnea: not an issue, does have a fluctuating cough  Secretions: not an issue  Mood: somewhat stable on sertraline, has been depressed due to pain  Edema: fluctuates but especially bad at this time, off steroids    Interval history 05/03/20 (Sheri [name] joint visit with Sheri [name]): Had  seen occupational therapy for consultative visit, plan to attend first official appointment tomorrow, will have 1x/week for 7 weeks. Will also have physical therapy and lymphedema therapy as well. She and her friend Sheri [name] describe a lack of motivation for anything, including coming to the hospital for chemotherapy despite knowing it will be helpful. Pain was debilitating for some time but was able to take their scheduled trip, now doing somewhat better and looking forward to chemotherapy.    Interval history 04/12/2020 (Sheri [name] joint visit with Sheri [name]): Had and recovered from COVID but has a persistent cough (tends to happen with all colds since childhood, mother has always been a heavy smoker which she believes contributed); was under the impression that she was not allowed back in cancer hospital for 21 days after infection, which has led to a delay in chemo admin and worsening symptoms. She says that her pain and swelling improve with chemo and is looking forward to today's treatment for those reasons. Nausea has been under better control with tweaks to the infusion regimen and PRN Compazine. Pain meds (Tylenol and Naproxen) have been only somewhat helpful for her joint pain and swollen extremities, and she is worried about an upcoming trip and whether the pain will interfere with her ability to enjoy her travels. She has also been having mobility challenges related to swelling and joint pain. Her friend Sheri [name] wonders whether physical therapy might be helpful for providing ideas for stretches/exercises they can do together at home. They are also working on setting up a regular routine to improve daytime energy.    Interval history 03/08/20 (Sheri [name]):   -- Feeling much better overall, with significant improvement in full-body pain now that she's been on a stable dose of Naproxen and Tylenol twice a day.   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   -- Began having headaches after chemotherapy, initially delayed and most recently when a nausea medication change was made, she got a headache immediately after treatment. It improved with 1/2 Percocet though she expressed a desire to avoid opioids. Now that it's been 3 days the headache has resolved, but she would love to have a way to prevent it with future chemo sessions. Says she tries to drink extra water around chemo sessions.  -- Nausea is somewhat stable and occurs with chemo and for 2-3 days afterward. Zofran has not been very helpful so she is mostly using Compazine.   -- Mental health much improved at this time; she's staying with her mother because relationship with dad has been strained since the holidays. Friend Sheri [name] also staying with her, and they plan to stay with Sheri [name]'s parents if/when they need to vacate her mom's house    Interval history 01/12/20 (Sheri [name]): Doing somewhat better overall, gabapentin trial was slightly (8-10%) helpful with arm/leg pain and possibly with sleep as well. Encouraging herself to get up and busy during the day, says she's glad she's been pushing herself. Went bowling with friends one night and had a good time. Says she won't overdo it when she knows she can't handle it. Still using non-prescription Xanax approximately once a day, believes it's 0.5mg  per dose. Hoping to come off it at some point and open to making small incremental changes and working on that together with her medical team. Says she saw someone in [place] about her joints and was told that there is no cartilage, that it's a result of the chemotherapy she was on in  the past.    Today's history 12/08/19 (Sheri [name]): Discussed her cancer history, a bad relationship that kept her from being able to participate in clinic visits and attend to her scheduled cancer care, and the fact that she is currently splitting time between both parents' homes and independently managing her medications and cancer care coordination. She shared that she had been self medicating with Xanax bars that she bought off the street and used in squares that she thinks are equivalent to 0.5mg  doses; that at times she would take more than a single dose to numb the pain even though she knew it wasn't actually affecting her pain. Symptoms as above.    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; had moved to night shifts due to issues with pain control limiting activity, picking up shifts again  Hobbies: detailing cars at the family shop  Current residence / distance from [place]: [place], [place]    Advance Care Planning:   HCPOA: not established  Natural surrogate decision maker: parents  Living Will: N/A  ACP note: none    Objective     Opioid Risk Tool:    Female  Female    Family history of substance abuse      Alcohol  1  3    Illegal drugs  2  3    Rx drugs  4  4    Personal history of substance abuse      Alcohol  3  3    Illegal drugs  4  4    Rx drugs  5  5    Age between 16--45 years  1  1    History of preadolescent sexual abuse  3  0    Psychological disease      ADD, OCD, bipolar, schizophrenia  2  2    Depression  1  1       Total: 8+ high risk  (<3 low risk, 4-7 moderate risk, >8 high risk)    Oncology History Overview Note   Left breast cT2 N0 IDC, G3, weakly ER+, PR-, HER-     Malignant neoplasm of upper-outer quadrant of left breast in female, estrogen receptor negative (CMS-HCC)   12/03/2017 -  Presenting Symptoms    Presented with self detected left breast mass x 3 weeks.  MMG/US: Left breast irregular 2.7 x 3.2 cm mass. There are faint microcalcifications associated with the mass and mild surrounding trabecular thickening is suggestive of edema.  There are 2 hyperdense and enlarged left axillary lymph nodes. No other suspicious findings are seen in the left breast.     12/03/2017 -  Other    Clinic exam: Left breast in the 1 to 2 o'clock position there is a 4 cm irregular mass. Subtle pink erythema without pitting. Left axilla with a palpable 2 cm lymph node.     12/03/2017 Biopsy    Left breast USG core biopsy: IDC, G3, ER+(5%), PR-(0), HER2-(0)  Left axillary USG core biopsy: fragments of LN negative for carcinoma 12/10/2017 Initial Diagnosis    Malignant neoplasm of upper-outer quadrant of left breast in female, estrogen receptor negative (CMS-HCC)     12/10/2017 Tumor Board    MDC recs: Left breast cT2 N0 IDC, G3, +/-/-. Very suspicious LN on imaging and exam. LN core biopsy negative. Discussed considering repeat core (although needle is definitively in the node) vs outback SLN. Will receive NACT. If node neg, plan for outback SLN. Node positivity would affect radiation recs if patient has mastectomy. Meeting genetics today. Will discuss fertility. Also discussed concerns for XRT in young patient in 20's - higher risk of complications from XRT long-term. Consider staging studies.     12/10/2017 -  Cancer Staged    Staging form: Breast, AJCC 8th Edition  - Clinical stage from 12/10/2017: Stage IIB (cT2, cN0(f), cM0, G3, ER+, PR-, HER2-) - Signed by Talbert Cage, DO on 12/10/2017       12/11/2017 Biopsy    Repeat left axillary core biopsy given clinical suspicion: LN negative for carcinoma     12/24/2017 - 02/17/2018 Chemotherapy    OP BREAST AC (DOSE DENSE)  DOXOrubicin 60 mg/m2, Cyclophosphamide 600 mg/m2 every 14 days     01/08/2018 Genetics    Patient has genetic testing done for breast cancer.  Negative [facility] Breast Cancer Guidelines-Based Panel.     02/25/2018 - 05/19/2018 Chemotherapy    OP BREAST PACLItaxel WEEKLY / CARBOplatin EVERY 3 WEEKS  PACLItaxel 80 mg/m2 IV weekly   CARBOplatin AUC 6 IV every 3 weeks   21-day cycle x 4 cycles      05/30/2018 Surgery    Left SSM: Residual IDC, Gr 2 +/-/- measuring 8mm.  Final margins are clear.  0/4 SLN with metastatic disease  Residual Cancer Burden   Tumor bed size: 8 mm x 7 mm  Overall tumor cellularity: 40%  Percentage in situ: 0%  Number of involved lymph nodes: 0  Diameter of largest metastasis: 0 mm  Residual Cancer Burden Score: 1.687  Residual Cancer Burden Class: RCB-II     06/09/2018 -  Cancer Staged    Staging form: Breast, AJCC 8th Edition  - Pathologic stage from 06/09/2018: No Stage Recommended (ypT1b, pN0(sn), cM0, G2, ER+, PR-, HER2-) - Signed by Rudie Meyer, ANP on 06/09/2018       10/23/2018 Surgery    Left chest wall excision by Dr. Dellis Anes. Final Path: IDC Grade 3 ER-PR- HER2- measuring 1 cm in greatest dimension. Final Margins clear.      11/12/2018 -  Cancer Staged    Staging form: Breast, AJCC 8th Edition  - Pathologic stage from 11/12/2018: Stage IB (rpT1b, pN0, cM0, G3, ER-, PR-, HER2-) - Signed by Talbert Cage, DO on 11/13/2018       11/10/2019 - 05/03/2020 Chemotherapy    OP BREAST GEMCITABINE (2 OF 3 WEEKS)  gemcitabine 1,000 mg/m2 IV on days 1, 8, every 21 days     08/10/2020 -  Cancer Staged    Staging form: Breast, AJCC 8th Edition  - Pathologic: Stage IV (pM1) - Signed by Donzetta Kohut, PA on 08/10/2020       09/15/2020 - 09/15/2020 Chemotherapy    OP BREAST SACITUZUMAB GOVITECAN-HZIY  sacituzumab govitecan-hziy 10 mg/kg IV on days 1, 8, every 21 days         Patient Active Problem List   Diagnosis   ??? Malignant neoplasm of upper-outer quadrant of left breast  in female, estrogen receptor negative (CMS-HCC)   ??? Microcytic anemia   ??? Symptomatic anemia   ??? PTSD (post-traumatic stress disorder)   ??? 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 Anxiety disorder   ??? S/P breast reconstruction   ??? Arthralgia   ??? Nausea       Past Medical History:   Diagnosis Date   ??? Anxiety    ??? Depression    ??? Malignant neoplasm of upper-outer quadrant of left breast in female, estrogen receptor negative (CMS-HCC) 12/10/2017   ??? Panic disorder    ??? PTSD (post-traumatic stress disorder)        Past Surgical History:   Procedure Laterality Date   ??? AUGMENTATION MAMMAPLASTY Left 08/2018    left only    ??? BREAST BIOPSY Left 2019    malignant   ??? CHEMOTHERAPY Left 11/2017    May 13 2018   ??? IR INSERT PORT AGE GREATER THAN 5 YRS  12/18/2017    IR INSERT PORT AGE GREATER THAN 5 YRS 12/18/2017 [name], MD IMG [facility]   ??? MASTECTOMY Left 05/2018   ??? PR BRONCHOSCOPY,BIOPSY Right 10/23/2019    Procedure: Bronchoscopy, Rigid/Flexible, Include Fluoro Guide When Performed; W/Bronch/Endobronch Bx, Single/Mult Site;  Surgeon: Mercy Moore, MD;  Location: MAIN OR [facility];  Service: Pulmonary   ??? PR BRONCHOSCOPY,TRANSBRON ASPIR BX Right 10/23/2019    Procedure: Bronchoscopy, Rigid/Flex, Incl Fluoro; W/Transbronch Ndl Aspirat Bx, Trachea, Main Stem &/Or Lobar Bronchus;  Surgeon: Mercy Moore, MD;  Location: MAIN OR [facility];  Service: Pulmonary   ??? PR BX/REMV,LYMPH NODE,DEEP AXILL Left 05/30/2018    Procedure: BX/EXC LYMPH NODE; OPEN, DEEP AXILRY NODE;  Surgeon: Talbert Cage, DO;  Location: ASC OR [facility];  Service: Surgical Oncology Breast   ??? PR EXC TUMOR SOFT TISSUE NECK/ANT THORAX SUBQ 3+CM Right 10/23/2018    Procedure: Priority EXCISION, TUMOR, SOFT TISSUE OF NECK OR ANTERIOR THORAX, SUBCUTANEOUS; 3 CM OR GREATER;  Surgeon: Talbert Cage, DO;  Location: ASC OR [facility];  Service: Surgical Oncology Breast   ??? PR IMPLNT BIO IMPLNT FOR SOFT TISSUE REINFORCEMENT Left 05/30/2018    Procedure: IMPLANTATION BIOLOGIC IMPLANT(EG, ACELLULAR DERMAL MATRIX) FOR SOFT TISSUE REINFORCEMENT(EG, BREAST, TRUNK);  Surgeon: Arsenio Katz, MD;  Location: ASC OR [facility];  Service: Plastics   ??? PR INSERTION BREAST IMPLANT SAME DAY OF MASTECTOMY Left 05/30/2018    Procedure: IMMED INSRT BREAST PROSTH AFTER MASTOPEX/MASTECT;  Surgeon: Arsenio Katz, MD;  Location: ASC OR [facility];  Service: Plastics   ??? PR INTRAOPERATIVE SENTINEL LYMPH NODE ID W DYE INJECTION Left 05/30/2018    Procedure: INTRAOPERATIVE IDENTIFICATION SENTINEL LYMPH NODE(S) INCLUDE INJECTION NON-RADIOACTIVE DYE, WHEN PERFORMED;  Surgeon: Talbert Cage, DO;  Location: ASC OR [facility];  Service: Surgical Oncology Breast   ??? PR MASTECTOMY, SIMPLE, COMPLETE Left 05/30/2018    Procedure: Urgent MASTECTOMY, SIMPLE, COMPLETE;  Surgeon: Talbert Cage, DO;  Location: ASC OR [facility];  Service: Surgical Oncology Breast   ??? PR RECMPL WND TRUNK 2.6-7.5 CM Left 10/23/2018    Procedure: REPR COMPLX TRUNK; 2.6 CM TO 7.5 CM;  Surgeon: Arsenio Katz, MD;  Location: ASC OR [facility];  Service: Plastics   ??? PR REPLACEMENT TISSUE EXPANDER W/PERMANENT IMPLANT Left 09/24/2018    Procedure: REPLACEMENT OF TISSUE EXPANDER WITH PERMANENT PROSTHESIS;  Surgeon: Arsenio Katz, MD;  Location: MAIN OR [facility];  Service: Plastics   ??? PR REVISION OF RECONSTRUCTED BREAST Left 09/24/2018  Procedure: R21 - REVISION OF RECONSTRUCTED  BREAST;  Surgeon: Arsenio Katz, MD;  Location: MAIN OR [facility];  Service: Plastics   ??? PR SUSPENSION OF BREAST Right 09/24/2018    Procedure: MASTOPEXY FOR SYMMETRY PURPOSES;  Surgeon: Arsenio Katz, MD;  Location: MAIN OR [facility];  Service: Plastics   ??? PR TISSUE EXPANDER PLACEMENT BREAST RECONSTRUCTION Left 05/30/2018    Procedure: BREAST RECON IMMED/DELAY W/EXPANDR W/SUBSQT EXPA;  Surgeon: Arsenio Katz, MD;  Location: ASC OR [facility];  Service: Plastics       Current Outpatient Medications   Medication Sig Dispense Refill   ??? acetaminophen (TYLENOL) 325 MG tablet Take 325 mg by mouth every six (6) hours as needed for pain.     ??? benzonatate (TESSALON PERLES) 100 MG capsule Take 1 capsule (100 mg total) by mouth Three (3) times a day as needed for cough. 60 capsule 1   ??? capecitabine (XELODA) 500 MG tablet Take 4 tablets (2,000 mg total) by mouth Two (2) times a day for 14 days . Followed by 7 days off drug. 112 tablet 0   ??? ergocalciferol-1,250 mcg, 50,000 unit, (VITAMIN D2-1,250 MCG, 50,000 UNIT,) 1,250 mcg (50,000 unit) capsule Take 1 capsule (1,250 mcg total) by mouth once a week. 4 capsule 2   ??? ferrous sulfate 325 (65 FE) MG tablet Take 1 tablet (325 mg total) by mouth daily. Take with orange juice. 30 tablet 3   ??? gabapentin (NEURONTIN) 300 MG capsule Take 1 capsule (300 mg total) by mouth nightly. 30 capsule 1   ??? hydrocortisone 2.5 % cream Apply to affected area twice daily 28 g 0   ??? lidocaine-prilocaine (EMLA) 2.5-2.5 % cream Apply topically once as needed for up to 1 dose. Apply small amount to port site 30 minutes - 1 hour prior to access. 30 g 0   ??? loperamide (IMODIUM) 2 mg capsule Take 2 mg by mouth 4 (four) times a day as needed.      ??? naproxen (NAPROSYN) 500 MG tablet Take 1 tablet (500 mg total) by mouth 2 (two) times a day with meals. 60 tablet 2   ??? olaparib (LYNPARZA) 150 mg tablet Take 2 tablets (300 mg total) by mouth Two (2) times a day. Swallow tablet whole. 120 capsule 5   ??? ondansetron (ZOFRAN-ODT) 4 MG disintegrating tablet Dissolve 1 tablet (4 mg total) on the tongue every eight (8) hours as needed for nausea for up to 30 doses. 30 tablet 1   ??? pantoprazole (PROTONIX) 40 MG tablet Take 1 tablet (40 mg total) by mouth daily. 90 tablet 3   ??? predniSONE (DELTASONE) 5 MG tablet Take 1 tablet (5 mg total) by mouth in the morning. 30 tablet 1   ??? prochlorperazine (COMPAZINE) 10 MG tablet Take 1 tablet (10 mg total) by mouth every six (6) hours as needed (nausea). 30 tablet 2   ??? sertraline (ZOLOFT) 100 MG tablet Take 2 tablets (200 mg total) by mouth daily. 60 tablet 2     No current facility-administered medications for this visit.       Allergies:   Allergies   Allergen Reactions   ??? Amoxicillin Other (See Comments)     EDEMA OF FACE   ??? Meloxicam Swelling and Rash     Swelling, itching, rash    ??? Shellfish Containing Products Swelling     Facial and throat swelling       Family History:  Cancer-related family history includes Skin cancer in  her mother. There is no history of Breast cancer, Colon cancer, or Ovarian cancer.  She indicated that the status of her mother is unknown. She indicated that the status of her neg hx is unknown.      REVIEW OF SYSTEMS:  A comprehensive review of 10 systems was negative except for pertinent positives noted in HPI.      PHYSICAL EXAM:   VS reviewed in Epic.Elevated SBP. Weight uptrending.  GEN: Awake and alert, pleasant appearing female in no acute distress  PSYCH: Euthymic. Amiable.  HEENT: No facial asymmetry.  CV: No heave.  LUNGS: No increased work of breathing, No cough during visit.  EXT: Significant 4 extremity edema, non-pitting  SKIN: No rashes, petechiae or jaundice noted. +healed linear excoriations L wrist c/w hx self harm    Lab Results   Component Value Date    CREATININE 0.45 (L) 09/08/2020     Lab Results   Component Value Date    ALKPHOS 190 (H) 09/08/2020    BILITOT 0.4 09/08/2020    BILIDIR <0.10 10/21/2018    PROT 7.1 09/08/2020    ALBUMIN 3.1 (L) 09/08/2020    ALT <7 (L) 09/08/2020    AST 9 09/08/2020          I personally spent 90 minutes face-to-face and non-face-to-face in the care of this patient, which includes all pre, intra, and post visit time on the date of service.      Mickie Hillier, MD PhD  [facility] Specialist

## 2020-09-13 NOTE — Unmapped (Signed)
Pharmacist Oral Chemotherapy Education    Medication: Capecitabine    Medication Education     Oral chemotherapy regimen: Capecitabine 2000 mg po BID x 14 days then 7 days off.  May titrate dose up to 2500 mg BID per tolerance.  Tentative Start Date: Pending  Pharmacy: [facility]     Medication indication, administration twice daily after a meal, regimen schedule, and oral chemotherapy handling precautions were reviewed with the patient.  Side effects discussed included but were not limited to: nausea/vomiting, complications of myelosuppression, mucositis, diarrhea, hand/foot syndrome, and fatigue.  Patient was instructed to apply lotion to hands and feet twice daily to reduce risk of this adverse effect.  Avoidance of excessive friction/rubbing of hands and feet as well as wearing good-fitting, comfortable shoes can also reduce irritation.  This medication can also cause photosensitivity so she was instructed to use sunscreen and/or protective clothing.  I also discussed the use of Imodium to treat diarrhea,  good oral care with  salt water rinses to reduce the risk of mouth sores, and use of anti-emetics to treat nausea and vomiting.    Drug interactions: Pantoprazole may reduce levels of capecitabine.  I do not recommend any changes at this time.    Handout provided: Chemotherapy handout from [facility]    Patient verbalized understanding of the above information as well as how to contact the Team with any questions/concerns.    Approximate time on the phonewith patient: 15 minutes     Anusha Claus Oleh Genin PharmD, BCOP, CPP  Hematology/Oncology Pharmacist  P: [phone]

## 2020-09-13 NOTE — Unmapped (Signed)
Pharmacist Medication Follow-Up  I called the patient to f/u on initiation of capecitabine.  I left a VM with my contact information.

## 2020-09-14 MED ORDER — NALOXONE 4 MG/ACTUATION NASAL SPRAY
1 refills | 0 days | Status: CP
Start: 2020-09-14 — End: ?

## 2020-09-14 NOTE — Unmapped (Incomplete)
Adolescent and Young Adult Cancer Program Visit   Comprehensive Cancer Support Program     THE AYA TEAM IS OFFSITE AND REMAINS AVAILABLE VIRTUALLY Monday and Wednesday from 8am - 5pm. Available in-person on Tuesdays, Thursdays, and Fridays 8am - 5pm.    Service date: September 13, 2020    Encounter type: {MWUXLKGMWN:02725}    Location of patient: {AYA Location of Patient:[address]}    Clinician: {AYA Clinician List (Optional):[id number]}    Patient identifiers: Naliyah is a 25 y.o. with ***. Laporche uses *** pronouns.     VISIT SUMMARY     Notes:      Topics Addressed:       Plan and Recommendations:    I will continue to follow Dayanna for AYA-appropriate support. Aunya has my contact information and has been encouraged to contact me as needed.     09/13/2020     [name], LCSW  Adolescent and Young Adult Clinical Social Worker  Pager: [phone]

## 2020-09-14 NOTE — Unmapped (Signed)
Sheri [name] with Sheri [name], [facility] contacted the Communication Center requesting to speak with the care team of Sheri [name] to discuss:    States patients co pay for Narcan is $40 would like to discuss if patient could get prescription at a better price through [facility]    Please contact Sheri [name] at [phone].    Thank you,   Sheri [name]  [facility] Cancer Communication Center   [phone]

## 2020-09-14 NOTE — Unmapped (Addendum)
Hi Ms Bahner,  It was nice to see you in clinic yesterday, thanks so much for coming in to see Sheri [name] in person.  We discussed the following plan:  -- START oxycodone 15mg -30mg  every 4-6 hours as needed for pain  -- Keep track of the oxycodone doses you use, can use an app like iHeadache (and pretend it says iPain)  -- Make sure no one else has access to your medication, keep it in the bag, and bring it to your future appointments when you come to the clinic  -- Do not take any medicine that's not prescribed to you, and please let me know if you are considering that or if your pain is not well controlled on this new medication  -- There is a prescription for Narcan to be used if you or someone else thinks you've taken too much oxycodone. It is a nose spray that can be used every few minutes if you're not waking up. Please let Sam know where you will be keeping it.  -- START daily Miralax (1 cap in morning) and Senna (1-2 tabs at bedtime), and can go up and down on both medicines to target one soft bowel movement per day to help with constipation from the oxycodone depending on how much you are needing  -- Continue other medications like famotidine, Tessalon both were sent to the pharmacy for you    I will see you next week by video visit in the afternoon on Tuesday 7/26 to check in. It will be important for you to be available every Tuesday for a brief check-in, like we discussed.    Great to see you and hope this new medication makes a big difference.  See you soon.  Dr Marlyne Beards Adult Palliative Care Specialist  [phone]    ---    Here is some additional information about [facility] Outpatient Oncology Palliative Care:     What is Palliative Care?  Palliative Care is medical care that focuses on managing symptoms of cancer or side effects from cancer treatment. The Outpatient Oncology Palliative Care service uses a team approach to help patients and their caregivers with the goal of maintaining the best quality of life possible during a cancer diagnosis and cancer treatment.   The Outpatient Oncology Palliative Care team serves all outpatient oncology clinics including surgery, gynecology, medicine and radiation oncology.      Who provides Outpatient Palliative Care?  The Outpatient Oncology Palliative Care team includes doctors, nurses, pharmacists, social workers, counselors and nutritionists. The team works with a patient's primary oncology team to create care plans that can help manage symptoms related to cancer and cancer treatment.     What services are offered by Outpatient Oncology Palliative Care?  We can help cancer patients with:  Pain  Symptoms such as nausea, vomiting, anxiety, depression, fatigue, loss of appetite of shortness of breath  Emotional, psychological, or spiritual suffering  Nutritional problems  Concern about medical decisions  Difficulty communicating values, goals and personal choices.     Who are the Outpatient Oncology Palliative Care team members?  Physicians: Mickie Hillier, MD; Kathlynn Grate, MD; Doreatha Martin, MD  Physician Fellows:  Ulanda Edison, MD; Theodoro Clock, MD; Remus Blake, MD; Payton Doughty, MD; Flora Lipps, MD  Nurse Practitioner: Burtis Junes, FNP  Clinical Pharmacist Practitioner:   Nurse Clinical Coordinator: Allegra Lai, RN, BSN, MS, OCN  Administrative Specialist: Dolores Frame     When can I see the Outpatient Oncology Palliative Care team and  how do I make contact in between visits?  We see patients Monday through Friday 8am - 4:30pm. If you have questions or concerns during clinic hours, please contact Sheri [name]. Any requests made outside of business hours will be addressed the following business day.      For appointments & questions Monday through Friday 8 AM-- 4:30 PM:  please call [phone] or Toll free [phone].     On Nights, Weekends and Holidays:  Call [phone] and ask for the Oncologist on call.

## 2020-09-14 NOTE — Unmapped (Signed)
[facility]   Patient Onboarding/Medication Counseling        Sheri [name] is a 25 y.o. female with breast cancer who I am counseling today on initiation of therapy.  I am speaking to the patient.    Was a Nurse, learning disability used for this call? No    Verified patient's date of birth / HIPAA.    Specialty medication(s) to be sent: Hematology/Oncology: Capecitabine 500mg , directions: 2000mg  2 times a day for 14 days on and 7 days off      Non-specialty medications/supplies to be sent: n/a      Medications not needed at this time: n/a         Xeloda (capecitabine)    Medication & Administration     Dosage: Take 2000mg  (four 500mg  tablets) 2 times a day for 14 days on and 7 days off    Administration: I reviewed the importance of taking with a full glass of water within 30 minutes of a meal (at least 1 cup of food). Discussed that tablet should be swallowed whole and cannot be crushed or chewed.    Adherence/Missed dose instruction: If a dose is missed, do not take an extra dose or two doses at one time. Simply take your next dose at the regularly scheduled time and record any missed doses so our team is aware.    Goals of Therapy     Prevent disease progression    Side Effects & Monitoring Parameters     ??? Nausea/vomiting  ??? Diarrhea/constipation  ??? Infection precautions  ??? Fatigue  ??? Mouth sores or irritation  ??? Hand/foot syndrome (tingling, numbness, pain, redness, peeling or blistering) Use Urea 20% cream, Aquaphor, Eucerin, Cetaphil, or CeraVe twice daily on hands and feet as preventative  ??? Sun precautions  ??? Decrease appetite/ taste changes  ??? Bleeding precautions (bruising easily, nose bleeds, gums bleed)  ??? Headache  ??? Dry skin  ??? Hair loss    The following side effects should be reported to the provider:  ??? Diarrhea not controlled by anti-diarrheals  ??? Swelling, warmth, numbness, change of color or pain in a leg or arm  ??? Signs of infection (fever >100.4, chills, sore throat, sputum production)  ??? Signs of liver problems (dark urine, abdominal pain, light-colored stools, vomiting, yellow skin or eyes)  ??? Signs of bleeding (vomiting or coughing up blood, blood that looks like coffee grounds, blood in the urine or black, red tarry stools, bruising that gets bigger without reason, any persistent or severe bleeding)  ??? Skin rash or signs of skin infection (cracking, peeling, blistering, bleeding skin, red or irritated eyes)  ??? Signs of fluid and electrolyte problems (mood changes, confusion, abnormal/fast  heartbeat, severe dizziness, passing out, increased thirst, seizures, loss of strength and energy, lack of appetite, unable to pass urine or change in amount of urine passed, dry mouth, dry eyes, or nausea or vomiting.  ??? Signs of hand foot syndrome (redness or irritation on the palms of the hands or soles of the feet)  ??? Signs of mucositis (red or swollen mouth/gums, sores in the mouth, gums or tongue, soreness or pain in the mouth or throat, difficulty swallowing or talking, dryness, mild burning, or pain when eating food white patches or pus in the mouth or on the tongue, increased mucus or thicker saliva)  ??? Signs of anaphylaxis (wheezing, chest tightness, swelling of face, lips, tongue or throat)    Monitoring parameters: Renal function should be estimated at  baseline to determine initial dose. During therapy, CBC with differential, hepatic function, and renal function should be monitored. Monitor INR closely if receiving concomitant warfarin. Pregnancy test prior to treatment initiation (in females of reproductive potential). Monitor for diarrhea, dehydration, hand-foot syndrome, Stevens-Johnson syndrome, toxic epidermal necrolysis, stomatitis, and cardiotoxicity. Monitor adherence.    Contraindications, Warnings & Precautions     [place] Boxed Warning: Capecitabine may increase the anticoagulant effects of warfarin; bleeding events, including death, have occurred with concomitant use. Clinically significant increases in prothrombin time (PT) and INR have occurred within several days to months after capecitabine initiation (in patients previously stabilized on anticoagulants), and may continue up to 1 month after capecitabine discontinuation. May occur in patients with or without liver metastases. Monitor PT and INR frequently and adjust anticoagulation dosing accordingly. An increased risk of coagulopathy is correlated with a cancer diagnosis and age >60 years.    Contraindications:  ??? Known hypersensitivity to capecitabine, fluorouracil, or any component of the formulation;   ??? Severe renal impairment (CrCl <30 mL/minute)    Warnings and Precautions:  ??? Bone marrow suppression  ??? Cardiotoxicity: Myocardial infarction, ischemia, angina, dysrhythmias, cardiac arrest, cardiac failure, sudden death, ECG changes, and cardiomyopathy  ??? Mouth sores-discussed use of baking soda/salt water rinses  ??? Dermatologic toxicity: Stevens-Johnson syndrome and toxic epidermal necrolysis   ??? Hand/foot prevention (moisturizers, luke warm hand washes, patting hands/feet dry, decrease rubbing on hands/feet)  ??? GI toxicity: May cause diarrhea (may be severe); Importance of good nutrition to help minimize diarrhea (high protein, BRAT, yogurt, avoid greasy and spicy foods).  Importance of hydration if having frequent diarrhea  ??? Reproductive concerns: Evaluate pregnancy status prior to therapy in females of reproductive potential. Females of reproductive potential should use effective contraception during treatment and for 6 months after the last dose. Males with female partners of reproductive potential should use effective contraception during treatment and for 3 months after the last dose. Based on the mechanism of action and data from animal reproduction studies, in utero exposure to capecitabine may cause fetal harm.   ??? Breast feeding considerations: It is not known if capecitabine is present in breast milk. Due to the potential for serious adverse reactions in the breastfed infant, breastfeeding is not recommended by the manufacturer during treatment and for 2 weeks after the last dose.    Drug/Food Interactions     ??? Medication list reviewed in Epic. The patient was instructed to inform the care team before taking any new medications or supplements. No drug interactions identified.   ??? Avoid live vaccines.    Storage, Handling Precautions, & Disposal     ??? This medication should be stored at room temperature and in a dry location. Keep out of reach of others including children and pets. Keep the medicine in the original container with a child-proof top (no pillboxes). Do not throw away or flush unused medication down the toilet or sink. This drug is considered hazardous and should be handled as little as possible.  If someone else helps with medication administration, they should wear gloves.      Current Medications (including OTC/herbals), Comorbidities and Allergies     Current Outpatient Medications   Medication Sig Dispense Refill   ??? acetaminophen (TYLENOL) 325 MG tablet Take 325 mg by mouth every six (6) hours as needed for pain.     ??? benzonatate (TESSALON PERLES) 100 MG capsule Take 1 capsule (100 mg total) by mouth Three (3) times a day as needed  for cough. 60 capsule 1   ??? capecitabine (XELODA) 500 MG tablet Take 4 tablets (2,000 mg total) by mouth Two (2) times a day for 14 days . Followed by 7 days off drug. 112 tablet 0   ??? ergocalciferol-1,250 mcg, 50,000 unit, (VITAMIN D2-1,250 MCG, 50,000 UNIT,) 1,250 mcg (50,000 unit) capsule Take 1 capsule (1,250 mcg total) by mouth once a week. 4 capsule 2   ??? famotidine (PEPCID) 20 MG tablet Take 1 tablet (20 mg total) by mouth Two (2) times a day. 60 tablet 11   ??? ferrous sulfate 325 (65 FE) MG tablet Take 1 tablet (325 mg total) by mouth daily. Take with orange juice. 30 tablet 3   ??? gabapentin (NEURONTIN) 300 MG capsule Take 1 capsule (300 mg total) by mouth nightly. 30 capsule 1   ??? hydrocortisone 2.5 % cream Apply to affected area twice daily 28 g 0   ??? lidocaine-prilocaine (EMLA) 2.5-2.5 % cream Apply topically once as needed for up to 1 dose. Apply small amount to port site 30 minutes - 1 hour prior to access. 30 g 0   ??? loperamide (IMODIUM) 2 mg capsule Take 2 mg by mouth 4 (four) times a day as needed.      ??? naproxen (NAPROSYN) 500 MG tablet Take 1 tablet (500 mg total) by mouth 2 (two) times a day with meals. 60 tablet 2   ??? olaparib (LYNPARZA) 150 mg tablet Take 2 tablets (300 mg total) by mouth Two (2) times a day. Swallow tablet whole. 120 capsule 5   ??? ondansetron (ZOFRAN-ODT) 4 MG disintegrating tablet Dissolve 1 tablet (4 mg total) on the tongue every eight (8) hours as needed for nausea for up to 30 doses. 30 tablet 1   ??? oxyCODONE (ROXICODONE) 15 MG immediate release tablet Take 1-2 tablets (15-30mg ) every 4-6 hours as needed for cancer related pain. 60 tablet 0   ??? pantoprazole (PROTONIX) 40 MG tablet Take 1 tablet (40 mg total) by mouth daily. 90 tablet 3   ??? polyethylene glycol (GLYCOLAX) 17 gram/dose powder Mix 17g (1 cap) in 4-8 ounces of extra liquid each day. 255 g 0   ??? predniSONE (DELTASONE) 5 MG tablet Take 1 tablet (5 mg total) by mouth in the morning. 30 tablet 1   ??? prochlorperazine (COMPAZINE) 10 MG tablet Take 1 tablet (10 mg total) by mouth every six (6) hours as needed (nausea). 30 tablet 2   ??? senna (SENNA) 8.6 mg tablet Take 1-2 tablets by mouth two (2) times a day as needed for constipation. 120 tablet 11   ??? sertraline (ZOLOFT) 100 MG tablet Take 2 tablets (200 mg total) by mouth daily. 60 tablet 2     No current facility-administered medications for this visit.       Allergies   Allergen Reactions   ??? Amoxicillin Other (See Comments)     EDEMA OF FACE   ??? Meloxicam Swelling and Rash     Swelling, itching, rash    ??? Shellfish Containing Products Swelling     Facial and throat swelling       Patient Active Problem List   Diagnosis   ??? Malignant neoplasm of upper-outer quadrant of left breast in female, estrogen receptor negative (CMS-HCC)   ??? Microcytic anemia   ??? Symptomatic anemia   ??? PTSD (post-traumatic stress disorder)   ??? 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 Anxiety disorder   ??? S/P breast reconstruction   ???  Arthralgia   ??? Nausea       Reviewed and up to date in Epic.    Appropriateness of Therapy     Acute infections noted within Epic:  No active infections  Patient reported infection: None    Is medication and dose appropriate based on diagnosis and infection status? Yes    Prescription has been clinically reviewed: Yes      Baseline Quality of Life Assessment      How many days over the past month did your condition  keep you from your normal activities? For example, brushing your teeth or getting up in the morning. 0    Financial Information     Medication Assistance provided: None Required    Anticipated copay of $0 reviewed with patient. Verified delivery address.    Delivery Information     Scheduled delivery date: 09/15/20    Expected start date: TBD    Medication will be delivered via Same Day Courier to the prescription address in [facility].  This shipment will not require a signature.      Explained the services we provide at [facility] and that each month we would call to set up refills.  Stressed importance of returning phone calls so that we could ensure they receive their medications in time each month.  Informed patient that we should be setting up refills 7-10 days prior to when they will run out of medication.  A pharmacist will reach out to perform a clinical assessment periodically.  Informed patient that a welcome packet, containing information about our pharmacy and other support services, a Notice of Privacy Practices, and a drug information handout will be sent.      The patient or caregiver noted above participated in the development of this care plan and knows that they can request review of or adjustments to the care plan at any time.      Patient or caregiver verbalized understanding of the above information as well as how to contact the pharmacy at [phone] option 4 with any questions/concerns.  The pharmacy is open Monday through Friday 8:30am-4:30pm.  A pharmacist is available 24/7 via pager to answer any clinical questions they may have.    Patient Specific Needs     - Does the patient have any physical, cognitive, or cultural barriers? No    - Does the patient have adequate living arrangements? (i.e. the ability to store and take their medication appropriately) Yes    - Did you identify any home environmental safety or security hazards? No    - Patient prefers to have medications discussed with  Patient     - Is the patient or caregiver able to read and understand education materials at a high school level or above? Yes    - Patient's primary language is  English     - Is the patient high risk? Yes, patient is taking oral chemotherapy. Appropriateness of therapy as been assessed    - Does the patient require physician intervention or other additional services (i.e. dietary/nutrition, smoking cessation, social work)? No      Florene Route [facility] Specialty Pharmacist

## 2020-09-15 DIAGNOSIS — C50412 Malignant neoplasm of upper-outer quadrant of left female breast: Principal | ICD-10-CM

## 2020-09-15 DIAGNOSIS — Z171 Estrogen receptor negative status [ER-]: Principal | ICD-10-CM

## 2020-09-15 DIAGNOSIS — M2550 Pain in unspecified joint: Principal | ICD-10-CM

## 2020-09-15 MED ORDER — NAPROXEN 500 MG TABLET
ORAL_TABLET | Freq: Two times a day (BID) | ORAL | 2 refills | 30 days
Start: 2020-09-15 — End: 2021-09-15

## 2020-09-16 DIAGNOSIS — C50412 Malignant neoplasm of upper-outer quadrant of left female breast: Principal | ICD-10-CM

## 2020-09-16 DIAGNOSIS — Z171 Estrogen receptor negative status [ER-]: Principal | ICD-10-CM

## 2020-09-16 DIAGNOSIS — M2550 Pain in unspecified joint: Principal | ICD-10-CM

## 2020-09-16 DIAGNOSIS — G893 Neoplasm related pain (acute) (chronic): Principal | ICD-10-CM

## 2020-09-16 MED ORDER — NAPROXEN 500 MG TABLET
ORAL_TABLET | Freq: Two times a day (BID) | ORAL | 2 refills | 30 days | Status: CP
Start: 2020-09-16 — End: 2021-09-16
  Filled 2020-09-16: qty 60, 30d supply, fill #0

## 2020-09-16 MED ORDER — NALOXONE 4 MG/ACTUATION NASAL SPRAY
1 refills | 0 days | Status: CP
Start: 2020-09-16 — End: ?

## 2020-09-16 MED FILL — PROCHLORPERAZINE MALEATE 10 MG TABLET: ORAL | 8 days supply | Qty: 30 | Fill #2

## 2020-09-19 NOTE — Unmapped (Signed)
Adolescent and Young Adult Cancer Program Visit   Comprehensive Cancer Support Program     THE AYA TEAM IS OFFSITE AND REMAINS AVAILABLE VIRTUALLY Monday and Wednesday from 8am - 5pm. Available in-person on Tuesdays, Thursdays, and Fridays 8am - 5pm.    Service date: September 19, 2020    Encounter type:??Text Message Exchange  ??  Clinician:??[name], LCSW  ??  Patient identifiers:??Sheri [name]??Sheri [name]??is a 25??y.o.??with relapsed breast cancer.??[name]??Sheri [name]??uses she/her??pronouns.??    VISIT SUMMARY     Notes: [name] texted to request rescheduling today's appointment. Rescheduled to next week.        I will continue to follow [name] for AYA-appropriate support. [name] has my contact information and has been encouraged to contact me as needed.     09/19/2020     [name], LCSW  Adolescent and Young Adult Clinical Social Worker  Pager: [phone]

## 2020-09-20 ENCOUNTER — Telehealth: Admit: 2020-09-20 | Discharge: 2020-09-21 | Payer: MEDICAID

## 2020-09-20 MED ORDER — OXYCODONE 15 MG TABLET
ORAL_TABLET | 0 refills | 0 days | Status: CP
Start: 2020-09-20 — End: ?

## 2020-09-20 MED ORDER — SERTRALINE 100 MG TABLET
ORAL_TABLET | Freq: Every day | ORAL | 2 refills | 30 days | Status: CP
Start: 2020-09-20 — End: 2021-09-20
  Filled 2020-09-27: qty 60, 30d supply, fill #0

## 2020-09-20 NOTE — Unmapped (Signed)
OUTPATIENT ONCOLOGY PALLIATIVE CARE    Principal Diagnosis: Sheri [name] is a 25 y.o. female with metastatic L breast cancer, diagnosed in October 2019. Disease sites include breast and both lungs.     Assessment/Plan:   1. Chemotherapy-associated and ?cancer-associated pain: described as primarily affecting her arms and legs, dull, throbbing, and sometimes shooting, includes the joints, responds well to Tylenol+Naproxen but short duration of effectiveness and has become more painful in prior weeks, responds to opioid medications that were offered by cousin who has a chronic muscle condition. We initiated opioid medications on 09/13/20 and discussed the importance of using only what is prescribed, keeping track of doses used (number per day and specific dose needed), not allowing others access to the medications, not using any other opioid or non-opioid medications prescribed to others, communicating about changes in pain needs other other substances being used outside of what has been prescribed.  -- Continue Tylenol 1,000mg  q8h  -- STOP Naproxen  -- Continue oxycodone 15-30mg  q4-6h PRN pain  -- Continue to keep notebook of doses/times/pain characteristics, discussed apps like iHeadache that can be helpful for tracking pain   -- No longer taking gabapentin because was not effective  -- Currently on 5mg  prednisone daily    2. Opioid-induced constipation:  -- INCREASE Miralax to 2 capfuls per day   -- INCREASE Senna to 2 tablets twice a day, can increase these meds in a stepwise manner  -- Discussed titrating to one soft BM daily while on opioid medications    3. Cough: stable; likely related to lung metastases, responds to Tessalon, also exacerbated by reflux  -- Tessalon 100mg  BID-TID PRN, effective at this dose  -- Famotidine 20mg  BID PRN, using after symptoms arise/with other meds, may improve if remains off NSAIDs  -- May respond to oxycodone as above  -- Continue to monitor for changes    4. Depression and anxiety: had been self-medicating with Xanax, not currently using as dealers were arrested and jailed, has tried Ativan (effective) and Klonopin (ineffective, seemed to heighten anxiety/panic episodes)  -- Continue sertraline 200mg  daily  -- No new benzodiazapine medications for now, will monitor anxiety and consider new depression/anxiety regimen at later date as discussed  -- Consider olanzapine at future date, possibly other adjuncts to help with lack of motivation    5. Nausea: occurs with chemotherapy and early days of opioid use, improved with most recent modifications  -- Continue Compazine PRN  -- Avoid Zofran as was contributing to headaches    6. Mobility issues: getting out of bed and getting dressed has been burdensome, awaiting PT/OT sessions    7. GOC: did not discuss in detail today; looking forward to saving up for her own rental home.     # Controlled substances risk management.   - Patient has a signed pain medication agreement with Outpatient Palliative Care, completed on 09/13/20, as per standard of care. Pain contract discussed in detail, including her high risk status and our inability to prescribe if any future red flags. She uses a paisley medicine bag for Zoloft, Naproxen, Tylenol, heartburn medication etc. and will use this bag for opioids and keep it in her presence at all times.   - [place] database was reviewed today and it was appropriate. No benzodiazapine prescriptions since 2020   - Urine drug screen was not performed at this [name]. Findings: not applicable.   - Patient has received information about safe storage and administration of medications.   - Patient has received  a prescription for narcan; has been educated on its use. Patient's caregiver/friend Sam was not present, but she was encouraged to discuss with her.      F/u: WEEKLY    ----------------------------------------  Referring Provider: Dr Avis Epley  Oncology Team:   Medical Oncologist: Dr. Marc Morgans / Louann Liv  Surgical Oncologist: Dr. [name]   Radiation Oncologist: Dr. Zacarias Pontes   PCP: No PCP Per Patient      HPI: Sheri [name] is a 25 y.o. woman with left breast cancer metastatic to lungs. She was initially diagnosed with local disease in October 2019 and completed chemotherapy in March 2020. Was found to have residual disease at time of mastectomy in April 2020 and again at time of reconstruction, and additional surgical resection was required. Restaging scans in August 2020 did not reveal metastatic disease, and she completed adjuvant radiation in December 2020. She was scheduled to continue capecitabine after radiation but due to a complicated psychosocial situation this did not occur. She was found to have metastatic disease in August 2021 when she presented with hemoptysis and cough and was found to have bilateral lung involvement.     Current cancer-directed therapy: planning to start Xeloda    Interval events (Sheri [name], video [name]), 09/20/20:  -- Pain very well controlled with oxycodone, has been taking notes because couldn't find an app; has used it less than prescribed  -- Will sometimes take 1 tab to avoid being too sleepy (if she was going to be driving our out and about)  -- Slept a lot after the first few days, now much more manageable  -- Pain described as pulsating and a 6 or 7/10 when she decides to take medication, will notice it first in ankles and wrists  -- Rates her pain as 0-2/10 about an hour after taking 2 tabs of oxycodone, will go down to 3.5 or 4/10 with one tablet  Per pain journal:       -- Tuesday 7/19: 2 tabs at 9pm       -- Wednesday 7/20: 1 tab at 1am, 1 tab at 11am, 1 tab at 2pm, 2 tabs at 6pm       -- Thursday 7/21:  2 tabs at MN, 2 tabs at 6am, 2 tabs at 2pm       -- Friday 7/22: 1 tab at 1am, 1 tab at 2am, 1 tab at 5am, 2 tabs at 11am, 2 tabs at 4pm, 2 tabs at 9pm       -- Saturday 7/23: 2 tabs at 3am, 2 tabs at 11am, 2 tabs at 6pm       -- Sunday 7/24: 2 tabs at 2am, 2 tabs at 6am, 2 tabs at 12pm, 2 tabs at 4pm, 1 tab at 9pm       -- Monday 7/25: 2 tabs at 3am, 2 tabs at 9am, 2 tabs at 1pm, 2 tabs at 5pm       -- Tuesday 7/26: 2 tabs at 1am, 2 tabs at 7am, 2 at 2pm  -- She has 8 tablets remaining, thinks she may have missed writing down one dose of one pill at some point  -- Some eyelid swelling for past 4-5 days, had started chemotherapy a few days prior to that  -- Has also been somewhat nauseated, wasn't sure if it was from chemo or oxycodone, compazine has been helpful  -- Keeps a funny sleep schedule (goes to bed around 0500 and wakes up early afternoon, will get up earlier  for meds and then go back to sleep) and sleeps ok, falls asleep easily  -- Notes slightly more swelling but believes it to be a side effect of chemotherapy  -- Mobility much better and not waking up with as much stiffness/pain in her joints  -- Anxiety had been heightened in the first few days after starting oxycodone, used Ativan because worried about having a panic attack, has been better since then.    Interval events (Sheri [name]), 09/13/20:  -- Pain described as all over my body and dull/throbbing, 8+/10 at its worst, will lie in bed crying when unable to capture it with current regimen  -- Will schedule meals around med schedule, because her medications are more effective on an empty stomach  -- Has tried percocet and morphine (most recently on Saturday), will take the pain down to 2/10 but only when taking 30mg  oxycodone dose  -- No more headaches, possibly from Zofran and/or prior chemo  -- Anxiety has been fluctuating, worse last week after scans/results, taking Ativan from prior prescription, has approximately 6 tablets left, thinks they are 1mg  dose  -- Still on a stable dose of Zoloft 200mg  which helps with baseline depression/anxiety, but her pain has been exacerbating her depression  -- Using a sleep aid (diphenhydramine), did not know it was a Benadryl equivalent  -- Been living with her mom for past several weeks brother there too but not much interaction, will stay with friend Sam for 1-2 weeks at a time and remain on good terms  -- Mom has alcoholism and paranoia, tends to fixate on certain topics, worse when it's focused on Sheri Shropshire herself, currently not an active issue for past few weeks  -- Talked about therapy as not having a lasting benefit but open to setting up a more stable counseling plan with Santina Evans if it can build in some flexibility based on her pain and mobility issues       Symptom Review (January 2022):  General: doing poorly overall, has had some debilitating pain in preceding weeks, unclear etiology  Pain: full-body pain has been worse in last few weeks, will improve in week or so after chemo but then become worse, described as pain in wrists and ankles, also mid/upper back  Headaches: resolved, were present with last 3 cycles, frontal in nature and very intense, would occur 48-72 hours after tx but most recently occurred immediately following treatment. No response to Naproxen/Tylenol, did seem to respond to acetaminophen/oxycodone.  Fatigue: worse  Mobility: worse, has had discomfort with movement  Sleep: improved quality, will sleep all day after chemo  Appetite: fluctuates with pain  Nausea: present with and after, will take antiemetic meds for 1-2 days post chemo  Bowel function: regular, no current issues with constipation/hemorrhoids/bleeding  Dyspnea: not an issue, does have a fluctuating cough  Secretions: not an issue  Mood: somewhat stable on sertraline, has been depressed due to pain  Edema: fluctuates but especially bad at this time, off steroids    Interval history 05/03/20 (Sheri [name] with Sheri [name]): Had seen occupational therapy for consultative [name], plan to attend first official appointment tomorrow, will have 1x/week for 7 weeks. Will also have physical therapy and lymphedema therapy as well. She and her friend Sam describe a lack of motivation for anything, including coming to the hospital for chemotherapy despite knowing it will be helpful. Pain was debilitating for some time but was able to take their scheduled trip, now doing somewhat better and looking forward to  chemotherapy.    Interval history 04/12/2020 (Sheri [name] with Sheri [name]): Had and recovered from COVID but has a persistent cough (tends to happen with all colds since childhood, mother has always been a heavy smoker which she believes contributed); was under the impression that she was not allowed back in cancer hospital for 21 days after infection, which has led to a delay in chemo admin and worsening symptoms. She says that her pain and swelling improve with chemo and is looking forward to today's treatment for those reasons. Nausea has been under better control with tweaks to the infusion regimen and PRN Compazine. Pain meds (Tylenol and Naproxen) have been only somewhat helpful for her joint pain and swollen extremities, and she is worried about an upcoming trip and whether the pain will interfere with her ability to enjoy her travels. She has also been having mobility challenges related to swelling and joint pain. Her friend Sam wonders whether physical therapy might be helpful for providing ideas for stretches/exercises they can do together at home. They are also working on setting up a regular routine to improve daytime energy.    Interval history 03/08/20 (Sheri [name]):   -- Feeling much better overall, with significant improvement in full-body pain now that she's been on a stable dose of Naproxen and Tylenol twice a day.   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   -- Began having headaches after chemotherapy, initially delayed and most recently when a nausea medication change was made, she got a headache immediately after treatment. It improved with 1/2 Percocet though she expressed a desire to avoid opioids. Now that it's been 3 days the headache has resolved, but she would love to have a way to prevent it with future chemo sessions. Says she tries to drink extra water around chemo sessions.  -- Nausea is somewhat stable and occurs with chemo and for 2-3 days afterward. Zofran has not been very helpful so she is mostly using Compazine.   -- Mental health much improved at this time; she's staying with her mother because relationship with dad has been strained since the holidays. Friend Sam also staying with her, and they plan to stay with Sam's parents if/when they need to vacate her mom's house    Interval history 01/12/20 (Sheri [name]): Doing somewhat better overall, gabapentin trial was slightly (8-10%) helpful with arm/leg pain and possibly with sleep as well. Encouraging herself to get up and busy during the day, says she's glad she's been pushing herself. Went bowling with friends one night and had a good time. Says she won't overdo it when she knows she can't handle it. Still using non-prescription Xanax approximately once a day, believes it's 0.5mg  per dose. Hoping to come off it at some point and open to making small incremental changes and working on that together with her medical team. Says she saw someone in [place] about her joints and was told that there is no cartilage, that it's a result of the chemotherapy she was on in the past.    Today's history 12/08/19 (Airianna Kreischer/Sheri [name]): Discussed her cancer history, a bad relationship that kept her from being able to participate in clinic visits and attend to her scheduled cancer care, and the fact that she is currently splitting time between both parents' homes and independently managing her medications and cancer care coordination. She shared that she had been self medicating with Xanax bars that she bought off the street and used in  squares that she thinks are equivalent to 0.5mg  doses; that at times she would take more than a single dose to numb the pain even though she knew it wasn't actually affecting her pain. Symptoms as above.    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; had moved to night shifts due to issues with pain control limiting activity, picking up shifts again  Hobbies: detailing cars at the family shop  Current residence / distance from [place]: [place], [place]    Advance Care Planning:   HCPOA: not established  Natural surrogate decision maker: parents  Living Will: N/A  ACP note: none    Objective     Opioid Risk Tool:    Female  Female    Family history of substance abuse      Alcohol  1  3    Illegal drugs  2  3    Rx drugs  4  4    Personal history of substance abuse      Alcohol  3  3    Illegal drugs  4  4    Rx drugs  5  5    Age between 16--45 years  1  1    History of preadolescent sexual abuse  3  0    Psychological disease      ADD, OCD, bipolar, schizophrenia  2  2    Depression  1  1       Total: 8+ high risk  (<3 low risk, 4-7 moderate risk, >8 high risk)    Oncology History Overview Note   Left breast cT2 N0 IDC, G3, weakly ER+, PR-, HER-     Malignant neoplasm of upper-outer quadrant of left breast in female, estrogen receptor negative (CMS-HCC)   12/03/2017 -  Presenting Symptoms    Presented with self detected left breast mass x 3 weeks.  MMG/US: Left breast irregular 2.7 x 3.2 cm mass. There are faint microcalcifications associated with the mass and mild surrounding trabecular thickening is suggestive of edema.  There are 2 hyperdense and enlarged left axillary lymph nodes. No other suspicious findings are seen in the left breast.     12/03/2017 -  Other    Clinic exam: Left breast in the 1 to 2 o'clock position there is a 4 cm irregular mass. Subtle pink erythema without pitting. Left axilla with a palpable 2 cm lymph node.     12/03/2017 Biopsy    Left breast USG core biopsy: IDC, G3, ER+(5%), PR-(0), HER2-(0)  Left axillary USG core biopsy: fragments of LN negative for carcinoma     12/10/2017 Initial Diagnosis    Malignant neoplasm of upper-outer quadrant of left breast in female, estrogen receptor negative (CMS-HCC)     12/10/2017 Tumor Board    MDC recs: Left breast cT2 N0 IDC, G3, +/-/-. Very suspicious LN on imaging and exam. LN core biopsy negative. Discussed considering repeat core (although needle is definitively in the node)  vs outback SLN. Will receive NACT. If node neg, plan for outback SLN. Node positivity would affect radiation recs if patient has mastectomy. Meeting genetics today. Will discuss fertility. Also discussed concerns for XRT in young patient in 20's - higher risk of complications from XRT long-term. Consider staging studies.     12/10/2017 -  Cancer Staged    Staging form: Breast, AJCC 8th Edition  - Clinical stage from 12/10/2017: Stage IIB (cT2, cN0(f), cM0, G3, ER+, PR-, HER2-) - Signed by Talbert Cage, DO on 12/10/2017       12/11/2017 Biopsy    Repeat left axillary core biopsy given clinical suspicion: LN negative for carcinoma     12/24/2017 - 02/17/2018 Chemotherapy    OP BREAST AC (DOSE DENSE)  DOXOrubicin 60 mg/m2, Cyclophosphamide 600 mg/m2 every 14 days     01/08/2018 Genetics    Patient has genetic testing done for breast cancer.  Negative [facility] Breast Cancer Guidelines-Based Panel.     02/25/2018 - 05/19/2018 Chemotherapy    OP BREAST PACLItaxel WEEKLY / CARBOplatin EVERY 3 WEEKS  PACLItaxel 80 mg/m2 IV weekly   CARBOplatin AUC 6 IV every 3 weeks   21-day cycle x 4 cycles      05/30/2018 Surgery    Left SSM: Residual IDC, Gr 2 +/-/- measuring 8mm.  Final margins are clear.  0/4 SLN with metastatic disease  Residual Cancer Burden   Tumor bed size: 8 mm x 7 mm  Overall tumor cellularity: 40%  Percentage in situ: 0%  Number of involved lymph nodes: 0  Diameter of largest metastasis: 0 mm  Residual Cancer Burden Score: 1.687  Residual Cancer Burden Class: RCB-II     06/09/2018 -  Cancer Staged    Staging form: Breast, AJCC 8th Edition  - Pathologic stage from 06/09/2018: No Stage Recommended (ypT1b, pN0(sn), cM0, G2, ER+, PR-, HER2-) - Signed by Rudie Meyer, ANP on 06/09/2018       10/23/2018 Surgery    Left chest wall excision by Dr. Dellis Anes. Final Path: IDC Grade 3 ER-PR- HER2- measuring 1 cm in greatest dimension. Final Margins clear.      11/12/2018 -  Cancer Staged    Staging form: Breast, AJCC 8th Edition  - Pathologic stage from 11/12/2018: Stage IB (rpT1b, pN0, cM0, G3, ER-, PR-, HER2-) - Signed by Talbert Cage, DO on 11/13/2018       11/10/2019 - 05/03/2020 Chemotherapy    OP BREAST GEMCITABINE (2 OF 3 WEEKS)  gemcitabine 1,000 mg/m2 IV on days 1, 8, every 21 days     08/10/2020 -  Cancer Staged    Staging form: Breast, AJCC 8th Edition  - Pathologic: Stage IV (pM1) - Signed by Donzetta Kohut, PA on 08/10/2020       09/15/2020 - 09/15/2020 Chemotherapy    OP BREAST SACITUZUMAB GOVITECAN-HZIY  sacituzumab govitecan-hziy 10 mg/kg IV on days 1, 8, every 21 days         Patient Active Problem List   Diagnosis   ??? Malignant neoplasm of upper-outer quadrant of left breast in female, estrogen receptor negative (CMS-HCC)   ??? Microcytic anemia   ??? Symptomatic anemia   ??? PTSD (post-traumatic stress disorder)   ??? 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 Anxiety disorder   ??? S/P breast reconstruction   ??? Arthralgia   ??? Nausea       Past Medical History:   Diagnosis Date   ??? Anxiety    ???  Depression    ??? Malignant neoplasm of upper-outer quadrant of left breast in female, estrogen receptor negative (CMS-HCC) 12/10/2017   ??? Panic disorder    ??? PTSD (post-traumatic stress disorder)        Past Surgical History:   Procedure Laterality Date   ??? AUGMENTATION MAMMAPLASTY Left 08/2018    left only    ??? BREAST BIOPSY Left 2019    malignant   ??? CHEMOTHERAPY Left 11/2017    May 13 2018   ??? IR INSERT PORT AGE GREATER THAN 5 YRS  12/18/2017    IR INSERT PORT AGE GREATER THAN 5 YRS 12/18/2017 [name], MD IMG VIR [facility]   ??? MASTECTOMY Left 05/2018   ??? PR BRONCHOSCOPY,BIOPSY Right 10/23/2019    Procedure: Bronchoscopy, Rigid/Flexible, Include Fluoro Guide When Performed; W/Bronch/Endobronch Bx, Single/Mult Site;  Surgeon: Mercy Moore, MD;  Location: MAIN OR [facility];  Service: Pulmonary   ??? PR BRONCHOSCOPY,TRANSBRON ASPIR BX Right 10/23/2019    Procedure: Bronchoscopy, Rigid/Flex, Incl Fluoro; W/Transbronch Ndl Aspirat Bx, Trachea, Main Stem &/Or Lobar Bronchus;  Surgeon: Mercy Moore, MD;  Location: MAIN OR [facility];  Service: Pulmonary   ??? PR BX/REMV,LYMPH NODE,DEEP AXILL Left 05/30/2018    Procedure: BX/EXC LYMPH NODE; OPEN, DEEP AXILRY NODE;  Surgeon: Talbert Cage, DO;  Location: ASC OR [facility];  Service: Surgical Oncology Breast   ??? PR EXC TUMOR SOFT TISSUE NECK/ANT THORAX SUBQ 3+CM Right 10/23/2018    Procedure: Priority EXCISION, TUMOR, SOFT TISSUE OF NECK OR ANTERIOR THORAX, SUBCUTANEOUS; 3 CM OR GREATER;  Surgeon: Talbert Cage, DO;  Location: ASC OR [facility];  Service: Surgical Oncology Breast   ??? PR IMPLNT BIO IMPLNT FOR SOFT TISSUE REINFORCEMENT Left 05/30/2018    Procedure: IMPLANTATION BIOLOGIC IMPLANT(EG, ACELLULAR DERMAL MATRIX) FOR SOFT TISSUE REINFORCEMENT(EG, BREAST, TRUNK);  Surgeon: Arsenio Katz, MD;  Location: ASC OR [facility];  Service: Plastics   ??? PR INSERTION BREAST IMPLANT SAME DAY OF MASTECTOMY Left 05/30/2018    Procedure: IMMED INSRT BREAST PROSTH AFTER MASTOPEX/MASTECT;  Surgeon: Arsenio Katz, MD;  Location: ASC OR [facility];  Service: Plastics   ??? PR INTRAOPERATIVE SENTINEL LYMPH NODE ID W DYE INJECTION Left 05/30/2018    Procedure: INTRAOPERATIVE IDENTIFICATION SENTINEL LYMPH NODE(S) INCLUDE INJECTION NON-RADIOACTIVE DYE, WHEN PERFORMED;  Surgeon: Talbert Cage, DO;  Location: ASC OR [facility];  Service: Surgical Oncology Breast   ??? PR MASTECTOMY, SIMPLE, COMPLETE Left 05/30/2018    Procedure: Urgent MASTECTOMY, SIMPLE, COMPLETE;  Surgeon: Talbert Cage, DO;  Location: ASC OR [facility];  Service: Surgical Oncology Breast   ??? PR RECMPL WND TRUNK 2.6-7.5 CM Left 10/23/2018    Procedure: REPR COMPLX TRUNK; 2.6 CM TO 7.5 CM;  Surgeon: Arsenio Katz, MD;  Location: ASC OR [facility];  Service: Plastics   ??? PR REPLACEMENT TISSUE EXPANDER W/PERMANENT IMPLANT Left 09/24/2018    Procedure: REPLACEMENT OF TISSUE EXPANDER WITH PERMANENT PROSTHESIS;  Surgeon: Arsenio Katz, MD;  Location: MAIN OR [facility];  Service: Plastics   ??? PR REVISION OF RECONSTRUCTED BREAST Left 09/24/2018    Procedure: R21 - REVISION OF RECONSTRUCTED  BREAST;  Surgeon: Arsenio Katz, MD;  Location: MAIN OR [facility];  Service: Plastics   ??? PR SUSPENSION OF BREAST Right 09/24/2018    Procedure: MASTOPEXY FOR SYMMETRY PURPOSES;  Surgeon: Arsenio Katz, MD;  Location: MAIN [facility];  Service: Plastics   ??? PR TISSUE EXPANDER PLACEMENT BREAST RECONSTRUCTION Left 05/30/2018    Procedure: BREAST RECON IMMED/DELAY W/EXPANDR  Lowell Guitar;  Surgeon: Arsenio Katz, MD;  Location: ASC OR [facility];  Service: Plastics       Current Outpatient Medications   Medication Sig Dispense Refill   ??? acetaminophen (TYLENOL) 325 MG tablet Take 325 mg by mouth every six (6) hours as needed for pain.     ??? benzonatate (TESSALON PERLES) 100 MG capsule Take 1 capsule (100 mg total) by mouth Three (3) times a day as needed for cough. 60 capsule 1   ??? capecitabine (XELODA) 500 MG tablet Take 4 tablets (2,000 mg total) by mouth Two (2) times a day for 14 days . Followed by 7 days off drug. 112 tablet 0 ??? ergocalciferol-1,250 mcg, 50,000 unit, (VITAMIN D2-1,250 MCG, 50,000 UNIT,) 1,250 mcg (50,000 unit) capsule Take 1 capsule (1,250 mcg total) by mouth once a week. 4 capsule 2   ??? famotidine (PEPCID) 20 MG tablet Take 1 tablet (20 mg total) by mouth Two (2) times a day. 60 tablet 11   ??? ferrous sulfate 325 (65 FE) MG tablet Take 1 tablet (325 mg total) by mouth daily. Take with orange juice. 30 tablet 3   ??? gabapentin (NEURONTIN) 300 MG capsule Take 1 capsule (300 mg total) by mouth nightly. 30 capsule 1   ??? hydrocortisone 2.5 % cream Apply to affected area twice daily 28 g 0   ??? lidocaine-prilocaine (EMLA) 2.5-2.5 % cream Apply topically once as needed for up to 1 dose. Apply small amount to port site 30 minutes - 1 hour prior to access. 30 g 0   ??? loperamide (IMODIUM) 2 mg capsule Take 2 mg by mouth 4 (four) times a day as needed.      ??? naloxone (NARCAN) 4 mg nasal spray One spray in either nostril once for known/suspected opioid overdose. May repeat every 2-3 minutes in alternating nostril til EMS arrives 2 each 1   ??? naproxen (NAPROSYN) 500 MG tablet Take 1 tablet (500 mg total) by mouth in the morning and 1 tablet (500 mg total) in the evening. Take with meals. 60 tablet 2   ??? olaparib (LYNPARZA) 150 mg tablet Take 2 tablets (300 mg total) by mouth Two (2) times a day. Swallow tablet whole. 120 capsule 5   ??? ondansetron (ZOFRAN-ODT) 4 MG disintegrating tablet Dissolve 1 tablet (4 mg total) on the tongue every eight (8) hours as needed for nausea for up to 30 doses. 30 tablet 1   ??? oxyCODONE (ROXICODONE) 15 MG immediate release tablet Take 1-2 tablets (15-30mg ) every 4-6 hours as needed for cancer related pain. 60 tablet 0   ??? pantoprazole (PROTONIX) 40 MG tablet Take 1 tablet (40 mg total) by mouth daily. 90 tablet 3   ??? polyethylene glycol (GLYCOLAX) 17 gram/dose powder Mix 17g (1 cap) in 4-8 ounces of extra liquid each day. 255 g 0   ??? predniSONE (DELTASONE) 5 MG tablet Take 1 tablet (5 mg total) by mouth in the morning. 30 tablet 1   ??? prochlorperazine (COMPAZINE) 10 MG tablet Take 1 tablet (10 mg total) by mouth every six (6) hours as needed (nausea). 30 tablet 2   ??? senna (SENNA) 8.6 mg tablet Take 1-2 tablets by mouth two (2) times a day as needed for constipation. 120 tablet 11   ??? sertraline (ZOLOFT) 100 MG tablet Take 2 tablets (200 mg total) by mouth daily. 60 tablet 2     No current facility-administered medications for this [name].       Allergies:  Allergies   Allergen Reactions   ??? Amoxicillin Other (See Comments)     EDEMA OF FACE   ??? Meloxicam Swelling and Rash     Swelling, itching, rash    ??? Shellfish Containing Products Swelling     Facial and throat swelling       Family History:  Cancer-related family history includes Skin cancer in her mother. There is no history of Breast cancer, Colon cancer, or Ovarian cancer.  She indicated that the status of her mother is unknown. She indicated that the status of her neg hx is unknown.      REVIEW OF SYSTEMS:  A comprehensive review of 10 systems was negative except for pertinent positives noted in HPI.      PHYSICAL EXAM:   VS not reviewed in Epic. Video [name]. Weight had been uptrending.  GEN: Awake and alert, pleasant appearing female in no acute distress  PSYCH: Euthymic. Amiable.  HEENT: No facial asymmetry. New upper eyelid edema b/l.  CV: No heave.  LUNGS: No increased work of breathing, No cough during [name].  SKIN: No rashes, petechiae or jaundice noted.    Lab Results   Component Value Date    CREATININE 0.45 (L) 09/08/2020     Lab Results   Component Value Date    ALKPHOS 190 (H) 09/08/2020    BILITOT 0.4 09/08/2020    BILIDIR <0.10 10/21/2018    PROT 7.1 09/08/2020    ALBUMIN 3.1 (L) 09/08/2020    ALT <7 (L) 09/08/2020    AST 9 09/08/2020              The patient reports they are currently: at home. I spent 42 minutes on the real-time audio and video with the patient on the date of service. I spent an additional 20 minutes on pre- and post-[name] activities on the date of service.     The patient was physically located in [place] or a state in which I am permitted to provide care. The patient and/or parent/guardian understood that s/he may incur co-pays and cost sharing, and agreed to the telemedicine [name]. The [name] was reasonable and appropriate under the circumstances given the patient's presentation at the time.    The patient and/or parent/guardian has been advised of the potential risks and limitations of this mode of treatment (including, but not limited to, the absence of in-person examination) and has agreed to be treated using telemedicine. The patient's/patient's family's questions regarding telemedicine have been answered.     If the [name] was completed in an ambulatory setting, the patient and/or parent/guardian has also been advised to contact their provider???s office for worsening conditions, and seek emergency medical treatment and/or call 911 if the patient deems either necessary.        Mickie Hillier, MD PhD  [facility]

## 2020-09-20 NOTE — Unmapped (Signed)
It was great seeing you on our video visit today.    We discussed stopping the Naproxen just to see if it's needed (for pain and/or swelling). I will touch base with Alan Ripper about the prednisone. Oxycodone has been refilled for tomorrow. Please continue to keep track of doses, that was super helpful, and I'm so glad you are finally getting some consistent pain relief!    Bump up the Miralax and possibly the Senna to get rid of the constipation. Let me know if that doesn't do the trick.    Someone will get with you next week even if it isn't me. And you and I can continue to touch base on text. Feel free to send me your pain log in a text message next week.    All the best, talk to you soon,  Dr Merlene Morse    ---    Here is some additional information about [facility] Outpatient Oncology Palliative Care:     What is Palliative Care?  Palliative Care is medical care that focuses on managing symptoms of cancer or side effects from cancer treatment. The Outpatient Oncology Palliative Care service uses a team approach to help patients and their caregivers with the goal of maintaining the best quality of life possible during a cancer diagnosis and cancer treatment.   The Outpatient Oncology Palliative Care team serves all outpatient oncology clinics including surgery, gynecology, medicine and radiation oncology.      Who provides Outpatient Palliative Care?  The Outpatient Oncology Palliative Care team includes doctors, nurses, pharmacists, social workers, counselors and nutritionists. The team works with a patient's primary oncology team to create care plans that can help manage symptoms related to cancer and cancer treatment.     What services are offered by Outpatient Oncology Palliative Care?  We can help cancer patients with:  Pain  Symptoms such as nausea, vomiting, anxiety, depression, fatigue, loss of appetite of shortness of breath  Emotional, psychological, or spiritual suffering  Nutritional problems  Concern about medical decisions  Difficulty communicating values, goals and personal choices.     Who are the Outpatient Oncology Palliative Care team members?  Physicians: Mickie Hillier, MD; Kathlynn Grate, MD; Doreatha Martin, MD  Physician Fellows:  Ulanda Edison, MD; Theodoro Clock, MD; Remus Blake, MD; Payton Doughty, MD; Flora Lipps, MD  Nurse Practitioner: Burtis Junes, FNP  Clinical Pharmacist Practitioner:   Nurse Clinical Coordinator: Allegra Lai, RN, BSN, MS, OCN  Administrative Specialist: Dolores Frame     When can I see the Outpatient Oncology Palliative Care team and how do I make contact in between visits?  We see patients Monday through Friday 8am - 4:30pm. If you have questions or concerns during clinic hours, please contact us. Any requests made outside of business hours will be addressed the following business day.      For appointments & questions Monday through Friday 8 AM-- 4:30 PM:  please call [phone] or Toll free [phone].     On Nights, Weekends and Holidays:  Call [phone] and ask for the Oncologist on call.

## 2020-09-21 NOTE — Unmapped (Signed)
Pharmacist Phone Follow-Up    Cancer Team  Medical Oncology: Dr. Avis Epley  Reason for call: Oral chemotherapy management  Current treatment: Capecitabine     Assessment/Plan  Breast cancer:  Sheri [name] is a 25 y.o. female with ER (5%)/PR-/HER2- metastatic breast cancer who started capecitabine on 7/21.  She has fatigue and some nausea but is otherwise tolerating the medication.  I reviewed use of prochlorperazine prior to the evening dose to try to prevent nausea.  She has no other adverse effects or concerns.     Plan:  1. Continue capecitabine 2000 mg po BID x 14 days on and 7 days off  2. Start prochlorperazine 10 mg prior to evening dose of capecitabine for prevention of nausea  3. 10/07/20 Provider appointment  ________________________________________________________________________     Interval History   Sheri [name] is a 25 y.o. female with metastatic breast cancer who I am calling to follow-up on initiation of capecitabine one week ago.  She reports that she is feeling more tired since starting the medication.  She has also had some nausea, mostly in the evenings.  She takes prochlorperazine and this helps some.  She avoids ondansetron due to HA.  I discussed taking prochlorperazine prior to her evening capecitabine dose to try to prevent nausea.  She denies mucositis, diarrhea or symptoms of HFS.    Oral chemotherapy:  Capecitabine 2000 mg po BID x 14 days on  Start date: 09/15/20  Pharmacy: [facility]     Adherence: No missed doses    Adverse Effects:   1. Fatigue  2. Nausea - will start to take prochlorperazine prior to evening dose    Drug interactions: Pantoprazole may reduce levels of capecitabine.  I do not recommend any changes at this time.    Medication list reviewed and updated in Epic.    Patient verbalized understanding of the above information.     I spent 10 minutes on the phone with the patient on the date of service. I spent an additional 10 minutes on pre- and post-visit activities. The patient was physically located in [place] or a state in which I am permitted to provide care. The patient and/or parent/guardian understood that s/he may incur co-pays and cost sharing, and agreed to the telemedicine visit. The visit was reasonable and appropriate under the circumstances given the patient's presentation at the time.    The patient and/or parent/guardian has been advised of the potential risks and limitations of this mode of treatment (including, but not limited to, the absence of in-person examination) and has agreed to be treated using telemedicine. The patient's/patient's family's questions regarding telemedicine have been answered.     If the visit was completed in an ambulatory setting, the patient and/or parent/guardian has also been advised to contact their provider???s office for worsening conditions, and seek emergency medical treatment and/or call 911 if the patient deems either necessary.      Oncology History Overview Note   Left breast cT2 N0 IDC, G3, weakly ER+, PR-, HER-     Malignant neoplasm of upper-outer quadrant of left breast in female, estrogen receptor negative (CMS-HCC)   12/03/2017 -  Presenting Symptoms    Presented with self detected left breast mass x 3 weeks.  MMG/US: Left breast irregular 2.7 x 3.2 cm mass. There are faint microcalcifications associated with the mass and mild surrounding trabecular thickening is suggestive of edema.  There are 2 hyperdense and enlarged left axillary lymph nodes. No other suspicious findings are  seen in the left breast.     12/03/2017 -  Other    Clinic exam: Left breast in the 1 to 2 o'clock position there is a 4 cm irregular mass. Subtle pink erythema without pitting. Left axilla with a palpable 2 cm lymph node.     12/03/2017 Biopsy    Left breast USG core biopsy: IDC, G3, ER+(5%), PR-(0), HER2-(0)  Left axillary USG core biopsy: fragments of LN negative for carcinoma     12/10/2017 Initial Diagnosis    Malignant neoplasm of upper-outer quadrant of left breast in female, estrogen receptor negative (CMS-HCC)     12/10/2017 Tumor Board    MDC recs: Left breast cT2 N0 IDC, G3, +/-/-. Very suspicious LN on imaging and exam. LN core biopsy negative. Discussed considering repeat core (although needle is definitively in the node) vs outback SLN. Will receive NACT. If node neg, plan for outback SLN. Node positivity would affect radiation recs if patient has mastectomy. Meeting genetics today. Will discuss fertility. Also discussed concerns for XRT in young patient in 20's - higher risk of complications from XRT long-term. Consider staging studies.     12/10/2017 -  Cancer Staged    Staging form: Breast, AJCC 8th Edition  - Clinical stage from 12/10/2017: Stage IIB (cT2, cN0(f), cM0, G3, ER+, PR-, HER2-) - Signed by Talbert Cage, DO on 12/10/2017       12/11/2017 Biopsy    Repeat left axillary core biopsy given clinical suspicion: LN negative for carcinoma     12/24/2017 - 02/17/2018 Chemotherapy    OP BREAST AC (DOSE DENSE)  DOXOrubicin 60 mg/m2, Cyclophosphamide 600 mg/m2 every 14 days     01/08/2018 Genetics    Patient has genetic testing done for breast cancer.  Negative [employer] Breast Cancer Guidelines-Based Panel.     02/25/2018 - 05/19/2018 Chemotherapy    OP BREAST PACLItaxel WEEKLY / CARBOplatin EVERY 3 WEEKS  PACLItaxel 80 mg/m2 IV weekly   CARBOplatin AUC 6 IV every 3 weeks   21-day cycle x 4 cycles      05/30/2018 Surgery    Left SSM: Residual IDC, Gr 2 +/-/- measuring 8mm.  Final margins are clear.  0/4 SLN with metastatic disease  Residual Cancer Burden   Tumor bed size: 8 mm x 7 mm  Overall tumor cellularity: 40%  Percentage in situ: 0%  Number of involved lymph nodes: 0  Diameter of largest metastasis: 0 mm  Residual Cancer Burden Score: 1.687  Residual Cancer Burden Class: RCB-II     06/09/2018 -  Cancer Staged    Staging form: Breast, AJCC 8th Edition  - Pathologic stage from 06/09/2018: No Stage Recommended (ypT1b, pN0(sn), cM0, G2, ER+, PR-, HER2-) - Signed by Rudie Meyer, ANP on 06/09/2018       10/23/2018 Surgery    Left chest wall excision by Dr. Dellis Anes. Final Path: IDC Grade 3 ER-PR- HER2- measuring 1 cm in greatest dimension. Final Margins clear.      11/12/2018 -  Cancer Staged    Staging form: Breast, AJCC 8th Edition  - Pathologic stage from 11/12/2018: Stage IB (rpT1b, pN0, cM0, G3, ER-, PR-, HER2-) - Signed by Talbert Cage, DO on 11/13/2018       11/10/2019 - 05/03/2020 Chemotherapy    OP BREAST GEMCITABINE (2 OF 3 WEEKS)  gemcitabine 1,000 mg/m2 IV on days 1, 8, every 21 days     08/10/2020 -  Cancer Staged    Staging form: Breast, AJCC 8th  Edition  - Pathologic: Stage IV (pM1) - Signed by Donzetta Kohut, PA on 08/10/2020       09/15/2020 - 09/15/2020 Chemotherapy    OP BREAST SACITUZUMAB GOVITECAN-HZIY  sacituzumab govitecan-hziy 10 mg/kg IV on days 1, 8, every 21 days          Pertinent Labs:   No visits with results within 1 Day(s) from this visit.   Latest known visit with results is:   Clinical Support on 09/08/2020   Component Date Value Ref Range Status   ??? Vitamin D Total (25OH) 09/08/2020 19.3 (A) 20.0 - 80.0 ng/mL Final   ??? Sodium 09/08/2020 136  135 - 145 mmol/L Final   ??? Potassium 09/08/2020 3.4  3.4 - 4.8 mmol/L Final   ??? Chloride 09/08/2020 99  98 - 107 mmol/L Final   ??? CO2 09/08/2020 27.7  20.0 - 31.0 mmol/L Final   ??? Anion Gap 09/08/2020 9  5 - 14 mmol/L Final   ??? BUN 09/08/2020 7 (A) 9 - 23 mg/dL Final   ??? Creatinine 09/08/2020 0.45 (A) 0.60 - 0.80 mg/dL Final   ??? BUN/Creatinine Ratio 09/08/2020 16   Final   ??? eGFR CKD-EPI (2021) Female 09/08/2020 >90  >=60 mL/min/1.85m2 Final    eGFR calculated with CKD-EPI 2021 equation in accordance with [employer] and [employer] of Nephrology Task Force recommendations.   ??? Glucose 09/08/2020 162  70 - 179 mg/dL Final   ??? Calcium 16/11/9602 8.8  8.7 - 10.4 mg/dL Final   ??? Albumin 54/10/8117 3.1 (A) 3.4 - 5.0 g/dL Final   ??? Total Protein 09/08/2020 7.1  5.7 - 8.2 g/dL Final   ??? Total Bilirubin 09/08/2020 0.4  0.3 - 1.2 mg/dL Final   ??? AST 14/78/2956 9  <=34 U/L Final   ??? ALT 09/08/2020 <7 (A) 10 - 49 U/L Final   ??? Alkaline Phosphatase 09/08/2020 190 (A) 46 - 116 U/L Final   ??? WBC 09/08/2020 7.9  3.6 - 11.2 10*9/L Final   ??? RBC 09/08/2020 3.71 (A) 3.95 - 5.13 10*12/L Final   ??? HGB 09/08/2020 10.5 (A) 11.3 - 14.9 g/dL Final   ??? HCT 21/30/8657 30.9 (A) 34.0 - 44.0 % Final   ??? MCV 09/08/2020 83.2  77.6 - 95.7 fL Final   ??? MCH 09/08/2020 28.1  25.9 - 32.4 pg Final   ??? MCHC 09/08/2020 33.9  32.0 - 36.0 g/dL Final   ??? RDW 84/69/6295 20.6 (A) 12.2 - 15.2 % Final   ??? MPV 09/08/2020 6.4 (A) 6.8 - 10.7 fL Final   ??? Platelet 09/08/2020 521 (A) 150 - 450 10*9/L Final   ??? nRBC 09/08/2020 0  <=4 /100 WBCs Final   ??? Neutrophils % 09/08/2020 84.0  % Final   ??? Lymphocytes % 09/08/2020 6.5  % Final   ??? Monocytes % 09/08/2020 8.6  % Final   ??? Eosinophils % 09/08/2020 0.6  % Final   ??? Basophils % 09/08/2020 0.3  % Final   ??? Absolute Neutrophils 09/08/2020 6.6  1.8 - 7.8 10*9/L Final   ??? Absolute Lymphocytes 09/08/2020 0.5 (A) 1.1 - 3.6 10*9/L Final   ??? Absolute Monocytes 09/08/2020 0.7  0.3 - 0.8 10*9/L Final   ??? Absolute Eosinophils 09/08/2020 0.0  0.0 - 0.5 10*9/L Final   ??? Absolute Basophils 09/08/2020 0.0  0.0 - 0.1 10*9/L Final   ??? Anisocytosis 09/08/2020 Marked (A) Not Present Final   ??? Smear Review Comments 09/08/2020 See Comment (A)  Undefined Final    Smear reviewed.        Current medications:   Current Outpatient Medications   Medication Sig Dispense Refill   ??? acetaminophen (TYLENOL) 325 MG tablet Take 325 mg by mouth every six (6) hours as needed for pain.     ??? benzonatate (TESSALON PERLES) 100 MG capsule Take 1 capsule (100 mg total) by mouth Three (3) times a day as needed for cough. 60 capsule 1   ??? capecitabine (XELODA) 500 MG tablet Take 4 tablets (2,000 mg total) by mouth Two (2) times a day for 14 days . Followed by 7 days off drug. 112 tablet 0   ??? ergocalciferol-1,250 mcg, 50,000 unit, (VITAMIN D2-1,250 MCG, 50,000 UNIT,) 1,250 mcg (50,000 unit) capsule Take 1 capsule (1,250 mcg total) by mouth once a week. 4 capsule 2   ??? famotidine (PEPCID) 20 MG tablet Take 1 tablet (20 mg total) by mouth Two (2) times a day. 60 tablet 11   ??? ferrous sulfate 325 (65 FE) MG tablet Take 1 tablet (325 mg total) by mouth daily. Take with orange juice. 30 tablet 3   ??? gabapentin (NEURONTIN) 300 MG capsule Take 1 capsule (300 mg total) by mouth nightly. 30 capsule 1   ??? lidocaine-prilocaine (EMLA) 2.5-2.5 % cream Apply topically once as needed for up to 1 dose. Apply small amount to port site 30 minutes - 1 hour prior to access. 30 g 0   ??? loperamide (IMODIUM) 2 mg capsule Take 2 mg by mouth 4 (four) times a day as needed.      ??? naloxone (NARCAN) 4 mg nasal spray One spray in either nostril once for known/suspected opioid overdose. May repeat every 2-3 minutes in alternating nostril til EMS arrives 2 each 1   ??? naproxen (NAPROSYN) 500 MG tablet Take 1 tablet (500 mg total) by mouth in the morning and 1 tablet (500 mg total) in the evening. Take with meals. 60 tablet 2   ??? olaparib (LYNPARZA) 150 mg tablet Take 2 tablets (300 mg total) by mouth Two (2) times a day. Swallow tablet whole. 120 capsule 5   ??? ondansetron (ZOFRAN-ODT) 4 MG disintegrating tablet Dissolve 1 tablet (4 mg total) on the tongue every eight (8) hours as needed for nausea for up to 30 doses. 30 tablet 1   ??? oxyCODONE (ROXICODONE) 15 MG immediate release tablet Take 1-2 tablets (15-30mg ) every 4-6 hours as needed for cancer related pain. 60 tablet 0   ??? pantoprazole (PROTONIX) 40 MG tablet Take 1 tablet (40 mg total) by mouth daily. 90 tablet 3   ??? polyethylene glycol (GLYCOLAX) 17 gram/dose powder Mix 17g (1 cap) in 4-8 ounces of extra liquid each day. 255 g 0   ??? predniSONE (DELTASONE) 5 MG tablet Take 1 tablet (5 mg total) by mouth in the morning. 30 tablet 1   ??? prochlorperazine (COMPAZINE) 10 MG tablet Take 1 tablet (10 mg total) by mouth every six (6) hours as needed (nausea). 30 tablet 2   ??? senna (SENNA) 8.6 mg tablet Take 1-2 tablets by mouth two (2) times a day as needed for constipation. 120 tablet 11   ??? sertraline (ZOLOFT) 100 MG tablet Take 2 tablets (200 mg total) by mouth daily. 60 tablet 2     No current facility-administered medications for this visit.

## 2020-09-26 NOTE — Unmapped (Signed)
Adolescent and Young Adult Cancer Program Visit   Comprehensive Cancer Support Program     THE AYA TEAM IS OFFSITE AND REMAINS AVAILABLE VIRTUALLY Monday and Wednesday from 8am - 5pm. Available in-person on Tuesdays, Thursdays, and Fridays 8am - 5pm.    Service date: September 26, 2020    Encounter type: Telephone Call    Location of patient: Primary Residence Listed in [place]    Clinician:??[name], LCSW  ??  Patient identifiers:??Sheri [name]??[name]??is a 25??y.o.??with relapsed breast cancer.??Sheri [name]??Misty Stanley??uses she/her??pronouns.??    VISIT SUMMARY     Notes: Phone counseling visit with [name]. With improved pain control, she has found that her anxiety about her cancer and the possibility of dying have increased. Provided support and exploration of approaches to managing anxiety and living with uncertainty in a values-based way.      I will continue to follow Kriste for AYA-appropriate support. Ursula has my contact information and has been encouraged to contact me as needed.     09/26/2020     [name], LCSW  Adolescent and Young Adult Clinical Social Worker  Pager: [phone]

## 2020-09-27 DIAGNOSIS — C50412 Malignant neoplasm of upper-outer quadrant of left female breast: Principal | ICD-10-CM

## 2020-09-27 DIAGNOSIS — G893 Neoplasm related pain (acute) (chronic): Principal | ICD-10-CM

## 2020-09-27 DIAGNOSIS — Z171 Estrogen receptor negative status [ER-]: Principal | ICD-10-CM

## 2020-09-27 MED ORDER — OXYCODONE 15 MG TABLET
ORAL_TABLET | 0 refills | 0 days | Status: CP
Start: 2020-09-27 — End: ?

## 2020-09-27 NOTE — Unmapped (Signed)
Spoke to pt for pain check in. Since last week 7/27 has started oxycodone IR 15-30 mg Q4-6 hrs for cancer pain.  Continued Tylenol 1 gm TID  No gabapentin  Restarted naproxen as she stopped it but had increased pain so restarted with positive effect    Continues the prednisone taper with her ONC team    Reports taking about 5 daily doses of 2 tabs oxy (30 mg)-always takes 2 tabs. Take 1 dose (2 tabs) overnight.  Sleeps at 3 /4 am until 12/1 pm in afternoon. Generally takes the 2 tabs at:  12:00, 1600, 2000, 2300, and 0300, and then 1 dose during sleeping hours.    Pain prior to oxy dose is 7.5 / 10 and after 1 hours reduces to 1/2 / 10 (if she is not active, but if is active (doing house work etc (reduces to 5/10.   Overall she is happy with pain improvement since starting the oxycodone.    Took last oxy tab at 1400 today, is out.      BM regular no straining. Not having to take laxatives but knows if goes for more than 2 days with out BM should tae senna and/or miralax. Last BM today soft.     Refill request sent to provider. Currently paying out of pocket for meds, paid $40 for oxy. Unsure if Medicaid status. Her family helps pay for her meds currently and does not feel it a burden at this currently time.

## 2020-09-29 DIAGNOSIS — C50412 Malignant neoplasm of upper-outer quadrant of left female breast: Principal | ICD-10-CM

## 2020-09-29 DIAGNOSIS — Z171 Estrogen receptor negative status [ER-]: Principal | ICD-10-CM

## 2020-09-29 MED ORDER — CAPECITABINE 500 MG TABLET
ORAL_TABLET | Freq: Two times a day (BID) | ORAL | 0 refills | 14 days | Status: CP
Start: 2020-09-29 — End: 2020-10-13
  Filled 2020-10-07: qty 112, 14d supply, fill #0

## 2020-09-29 NOTE — Unmapped (Signed)
[facility] Clinical Assessment & Refill Coordination Note    Sheri [name], DOB: 02/05/1996  Phone: [phone] (home)     All above HIPAA information was verified with patient.     Was a Nurse, learning disability used for this call? No    Specialty Medication(s):   Hematology/Oncology: Capecitabine 500mg , directions: 2000mg  2 times a day for 14 days on and 7 days off     Current Outpatient Medications   Medication Sig Dispense Refill   ??? acetaminophen (TYLENOL) 325 MG tablet Take 325 mg by mouth every six (6) hours as needed for pain.     ??? benzonatate (TESSALON PERLES) 100 MG capsule Take 1 capsule (100 mg total) by mouth Three (3) times a day as needed for cough. 60 capsule 1   ??? capecitabine (XELODA) 500 MG tablet Take 4 tablets (2,000 mg total) by mouth Two (2) times a day for 14 days . Followed by 7 days off drug. 112 tablet 0   ??? ergocalciferol-1,250 mcg, 50,000 unit, (VITAMIN D2-1,250 MCG, 50,000 UNIT,) 1,250 mcg (50,000 unit) capsule Take 1 capsule (1,250 mcg total) by mouth once a week. 4 capsule 2   ??? famotidine (PEPCID) 20 MG tablet Take 1 tablet (20 mg total) by mouth Two (2) times a day. 60 tablet 11   ??? ferrous sulfate 325 (65 FE) MG tablet Take 1 tablet (325 mg total) by mouth daily. Take with orange juice. 30 tablet 3   ??? gabapentin (NEURONTIN) 300 MG capsule Take 1 capsule (300 mg total) by mouth nightly. 30 capsule 1   ??? lidocaine-prilocaine (EMLA) 2.5-2.5 % cream Apply topically once as needed for up to 1 dose. Apply small amount to port site 30 minutes - 1 hour prior to access. 30 g 0   ??? loperamide (IMODIUM) 2 mg capsule Take 2 mg by mouth 4 (four) times a day as needed.      ??? naloxone (NARCAN) 4 mg nasal spray One spray in either nostril once for known/suspected opioid overdose. May repeat every 2-3 minutes in alternating nostril til EMS arrives 2 each 1   ??? naproxen (NAPROSYN) 500 MG tablet Take 1 tablet (500 mg total) by mouth in the morning and 1 tablet (500 mg total) in the evening. Take with meals. 60 tablet 2   ??? olaparib (LYNPARZA) 150 mg tablet Take 2 tablets (300 mg total) by mouth Two (2) times a day. Swallow tablet whole. 120 capsule 5   ??? ondansetron (ZOFRAN-ODT) 4 MG disintegrating tablet Dissolve 1 tablet (4 mg total) on the tongue every eight (8) hours as needed for nausea for up to 30 doses. 30 tablet 1   ??? oxyCODONE (ROXICODONE) 15 MG immediate release tablet Take 1-2 tablets (15-30mg ) every 4-6 hours as needed for cancer related pain. 60 tablet 0   ??? pantoprazole (PROTONIX) 40 MG tablet Take 1 tablet (40 mg total) by mouth daily. 90 tablet 3   ??? polyethylene glycol (GLYCOLAX) 17 gram/dose powder Mix 17g (1 cap) in 4-8 ounces of extra liquid each day. 255 g 0   ??? predniSONE (DELTASONE) 5 MG tablet Take 1 tablet (5 mg total) by mouth in the morning. 30 tablet 1   ??? prochlorperazine (COMPAZINE) 10 MG tablet Take 1 tablet (10 mg total) by mouth every six (6) hours as needed (nausea). 30 tablet 2   ??? senna (SENNA) 8.6 mg tablet Take 1-2 tablets by mouth two (2) times a day as needed for constipation. 120 tablet 11   ???  sertraline (ZOLOFT) 100 MG tablet Take 2 tablets (200 mg total) by mouth daily. 60 tablet 2     No current facility-administered medications for this visit.        Changes to medications: [name] reports no changes at this time.    Allergies   Allergen Reactions   ??? Amoxicillin Other (See Comments)     EDEMA OF FACE   ??? Meloxicam Swelling and Rash     Swelling, itching, rash    ??? Shellfish Containing Products Swelling     Facial and throat swelling       Changes to allergies: No    SPECIALTY MEDICATION ADHERENCE     Capecitabine 500 mg: 1 days of medicine on hand       Medication Adherence    Patient reported X missed doses in the last month: 0  Specialty Medication: capecitabine 500mg           Specialty medication(s) dose(s) confirmed: Regimen is correct and unchanged.     Are there any concerns with adherence? No    Adherence counseling provided? Not needed    CLINICAL MANAGEMENT AND INTERVENTION      Clinical Benefit Assessment:    Do you feel the medicine is effective or helping your condition? Yes    Clinical Benefit counseling provided? Not needed    Adverse Effects Assessment:    Are you experiencing any side effects? No    Are you experiencing difficulty administering your medicine? No    Quality of Life Assessment:    Quality of Life      Oncology  1.On a scale of 1-10, rate your pain and/or discomfort you've experienced within the last week.: 1  2.On a scale of 1-10, how has cancer symptoms and/or treatment side effects interfered with your ability to complete your normal daily activities within the last week?: 1  3.Within the last week, how would you rate your feeling of anxiousness and/or depression based on the following options?: None (1)              Have you discussed this with your provider? Not needed    Acute Infection Status:    Acute infections noted within Epic:  No active infections  Patient reported infection: None    Therapy Appropriateness:    Is therapy appropriate? Yes, therapy is appropriate and should be continued    DISEASE/MEDICATION-SPECIFIC INFORMATION      N/A    PATIENT SPECIFIC NEEDS     - Does the patient have any physical, cognitive, or cultural barriers? No    - Is the patient high risk? Yes, patient is taking oral chemotherapy. Appropriateness of therapy as been assessed    - Does the patient require a Care Management Plan? No     - Does the patient require physician intervention or other additional services (i.e. nutrition, smoking cessation, social work)? No      SHIPPING     Specialty Medication(s) to be Shipped:   Hematology/Oncology: Capecitabine 500mg , directions: 2000mg  2 times a day for 14 days on and 7 days off    Other medication(s) to be shipped: No additional medications requested for fill at this time     Changes to insurance: No    Delivery Scheduled: Yes, Expected medication delivery date: 10/03/20.  However, Rx request for refills was sent to the provider as there are none remaining.     Medication will be delivered via Same Day Courier to the confirmed prescription address in  Epic [facility].    The patient will receive a drug information handout for each medication shipped and additional FDA Medication Guides as required.  Verified that patient has previously received a Conservation officer, historic buildings and a Surveyor, mining.    The patient or caregiver noted above participated in the development of this care plan and knows that they can request review of or adjustments to the care plan at any time.      All of the patient's questions and concerns have been addressed.    Rollen Sox   [facility] Specialty Pharmacist

## 2020-10-04 ENCOUNTER — Telehealth: Admit: 2020-10-04 | Discharge: 2020-10-05

## 2020-10-04 DIAGNOSIS — G893 Neoplasm related pain (acute) (chronic): Principal | ICD-10-CM

## 2020-10-04 DIAGNOSIS — Z171 Estrogen receptor negative status [ER-]: Principal | ICD-10-CM

## 2020-10-04 DIAGNOSIS — C50412 Malignant neoplasm of upper-outer quadrant of left female breast: Principal | ICD-10-CM

## 2020-10-04 MED ORDER — OXYCODONE 15 MG TABLET
ORAL_TABLET | 0 refills | 0 days | Status: CP
Start: 2020-10-04 — End: ?

## 2020-10-04 NOTE — Unmapped (Signed)
OUTPATIENT ONCOLOGY PALLIATIVE CARE    Principal Diagnosis: Sheri [name] is a 25 y.o. female with metastatic L breast cancer, diagnosed in October 2019. Disease sites include breast and both lungs.     Assessment/Plan:   1. Chemotherapy-associated and ?cancer-associated +/- rheumatologic pain: described as primarily affecting her arms and legs, dull, throbbing, and sometimes shooting, includes the joints, responds well to Tylenol+Naproxen but short duration of effectiveness and had become more painful in prior weeks, responds to opioid medications that were offered by cousin who has a chronic muscle condition. Pain control regimen somewhat less effective off steroids. We initiated opioid medications on 09/13/20 and discussed the importance of using only what is prescribed, keeping track of doses used (number per day and specific dose needed), not allowing others access to the medications, not using any other opioid or non-opioid medications prescribed to others, communicating about changes in pain needs other other substances being used outside of what has been prescribed.  -- Continue Tylenol 1,000mg  q8h  -- Remain off Naproxen  -- Continue oxycodone 15-30mg  q4-6h PRN pain, will increase supply to accommodate usage pattern; plan is for Sheri-contin 60mg  BID to increase to TID but will await visit with rheumatology in case new meds established.  -- Continue to keep app of doses/times/pain characteristics  -- No longer taking gabapentin because was not effective  -- Now off daily 5mg  prednisone, may benefit from resumption or     2. Opioid-induced constipation, improved due to side effects of chemotherapy:  -- Continue Miralax and Senna as needed  -- Discussed titrating to one soft BM daily while on opioid medications    3. Cough: stable; likely related to lung metastases, responds to Tessalon, also exacerbated by reflux  -- Tessalon 100mg  BID-TID PRN, effective at this dose  -- Famotidine 20mg  BID PRN, using after symptoms arise/with other meds, may improve if remains off NSAIDs  -- May respond to oxycodone as above  -- Continue to monitor for changes    4. Depression and anxiety: had been self-medicating with Xanax, not currently using as dealers were arrested and jailed, has tried Ativan (effective) and Klonopin (ineffective, seemed to heighten anxiety/panic episodes)  -- Continue sertraline 200mg  daily  -- No new benzodiazapine medications for now, will monitor anxiety and consider new depression/anxiety regimen at later date as discussed  -- Consider olanzapine at future date, possibly other adjuncts to help with lack of motivation    5. Nausea: occurs with chemotherapy and early days of opioid use, improved with most recent modifications  -- Continue Compazine PRN  -- Avoid Zofran as was contributing to headaches    6. Mobility issues: getting out of bed and getting dressed has been burdensome, awaiting PT/OT sessions    7. Urge incontinence: unlikely related to opioid use, possibly new chemotherapy regimen, encouraged her to reach out to onc team and this MD will reach out as well.    8. GOC: did not discuss in detail today; looking forward to saving up for her own rental home.     # Controlled substances risk management.   - Patient has a signed pain medication agreement with Outpatient Palliative Care, completed on 09/13/20, as per standard of care. Pain contract discussed in detail, including her high risk status and our inability to prescribe if any future red flags. She uses a paisley medicine bag for Zoloft, Tylenol, heartburn medication etc. and will use this bag for opioids and keep it in her presence at all times.   -  [place] database was reviewed today and it was appropriate. No benzodiazapine prescriptions since 2020   - Urine drug screen was not performed at this visit. Findings: not applicable.   - Patient has received information about safe storage and administration of medications.   - Patient has received a prescription for narcan; has been educated on its use. Patient's caregiver/friend Sam was not present, but she was encouraged to discuss with her.      F/u: WEEKLY    ----------------------------------------  Referring Provider: Dr Avis Epley  Oncology Team:   Medical Oncologist: Dr. Marc Morgans / Louann Liv  Surgical Oncologist: Dr. [name]   Radiation Oncologist: Dr. Zacarias Pontes   PCP: No PCP Per Patient      HPI: Sheri [name] is a 25 y.o. woman with left breast cancer metastatic to lungs. She was initially diagnosed with local disease in October 2019 and completed chemotherapy in March 2020. Was found to have residual disease at time of mastectomy in April 2020 and again at time of reconstruction, and additional surgical resection was required. Restaging scans in August 2020 did not reveal metastatic disease, and she completed adjuvant radiation in December 2020. She was scheduled to continue capecitabine after radiation but due to a complicated psychosocial situation this did not occur. She was found to have metastatic disease in August 2021 when she presented with hemoptysis and cough and was found to have bilateral lung involvement.     Current cancer-directed therapy: Xeloda    Interval events (Sheri [name], video visit), 10/04/20:  -- Pain remains well controlled on oxycodone 30mg  q4-6h when used consistently  -- Slightly more pain off steroids  -- Will wake up stiff and in pain when no recent overnight dose of oxycodone  -- Has been sleeping more regular hours past few nights, feels good  -- No constipation as diarrhea is a side effect of current chemotherapy regimen  -- Has been having a few days of urinary urgency and incontinence, wondering if related to opioids or something else; says she will wake up stiff and achy and unable to make it to bathroom before urinating spontaneously.    Interval history (Sheri [name], video visit), 09/20/20:  -- Pain very well controlled with oxycodone, has been taking notes because couldn't find an app; has used it less than prescribed  -- Will sometimes take 1 tab to avoid being too sleepy (if she was going to be driving our out and about)  -- Slept a lot after the first few days, now much more manageable  -- Pain described as pulsating and a 6 or 7/10 when she decides to take medication, will notice it first in ankles and wrists  -- Rates her pain as 0-2/10 about an hour after taking 2 tabs of oxycodone, will go down to 3.5 or 4/10 with one tablet  Per pain journal:       -- Tuesday 7/19: 2 tabs at 9pm       -- Wednesday 7/20: 1 tab at 1am, 1 tab at 11am, 1 tab at 2pm, 2 tabs at 6pm       -- Thursday 7/21:  2 tabs at MN, 2 tabs at 6am, 2 tabs at 2pm       -- Friday 7/22: 1 tab at 1am, 1 tab at 2am, 1 tab at 5am, 2 tabs at 11am, 2 tabs at 4pm, 2 tabs at 9pm       -- Saturday 7/23: 2 tabs at 3am, 2 tabs at 11am, 2 tabs  at 6pm       -- Sunday 7/24: 2 tabs at 2am, 2 tabs at 6am, 2 tabs at 12pm, 2 tabs at 4pm, 1 tab at 9pm       -- Monday 7/25: 2 tabs at 3am, 2 tabs at 9am, 2 tabs at 1pm, 2 tabs at 5pm       -- Tuesday 7/26: 2 tabs at 1am, 2 tabs at 7am, 2 at 2pm  -- She has 8 tablets remaining, thinks she may have missed writing down one dose of one pill at some point  -- Some eyelid swelling for past 4-5 days, had started chemotherapy a few days prior to that  -- Has also been somewhat nauseated, wasn't sure if it was from chemo or oxycodone, compazine has been helpful  -- Keeps a funny sleep schedule (goes to bed around 0500 and wakes up early afternoon, will get up earlier for meds and then go back to sleep) and sleeps ok, falls asleep easily  -- Notes slightly more swelling but believes it to be a side effect of chemotherapy  -- Mobility much better and not waking up with as much stiffness/pain in her joints  -- Anxiety had been heightened in the first few days after starting oxycodone, used Ativan because worried about having a panic attack, has been better since then.    Interval events (Sheri [name]), 09/13/20:  -- Pain described as all over my body and dull/throbbing, 8+/10 at its worst, will lie in bed crying when unable to capture it with current regimen  -- Will schedule meals around med schedule, because her medications are more effective on an empty stomach  -- Has tried percocet and morphine (most recently on Saturday), will take the pain down to 2/10 but only when taking 30mg  oxycodone dose  -- No more headaches, possibly from Zofran and/or prior chemo  -- Anxiety has been fluctuating, worse last week after scans/results, taking Ativan from prior prescription, has approximately 6 tablets left, thinks they are 1mg  dose  -- Still on a stable dose of Zoloft 200mg  which helps with baseline depression/anxiety, but her pain has been exacerbating her depression  -- Using a sleep aid (diphenhydramine), did not know it was a Benadryl equivalent  -- Been living with her mom for past several weeks brother there too but not much interaction, will stay with friend Sam for 1-2 weeks at a time and remain on good terms  -- Mom has alcoholism and paranoia, tends to fixate on certain topics, worse when it's focused on Sheri [name] herself, currently not an active issue for past few weeks  -- Talked about therapy as not having a lasting benefit but open to setting up a more stable counseling plan with Sheri [name] if it can build in some flexibility based on her pain and mobility issues       Symptom Review (January 2022):  General: doing poorly overall, has had some debilitating pain in preceding weeks, unclear etiology  Pain: full-body pain has been worse in last few weeks, will improve in week or so after chemo but then become worse, described as pain in wrists and ankles, also mid/upper back  Headaches: resolved, were present with last 3 cycles, frontal in nature and very intense, would occur 48-72 hours after tx but most recently occurred immediately following treatment. No response to Naproxen/Tylenol, did seem to respond to acetaminophen/oxycodone.  Fatigue: worse  Mobility: worse, has had discomfort with movement  Sleep: improved quality, will sleep all day  after chemo  Appetite: fluctuates with pain  Nausea: present with and after, will take antiemetic meds for 1-2 days post chemo  Bowel function: regular, no current issues with constipation/hemorrhoids/bleeding  Dyspnea: not an issue, does have a fluctuating cough  Secretions: not an issue  Mood: somewhat stable on sertraline, has been depressed due to pain  Edema: fluctuates but especially bad at this time, off steroids    Interval history 05/03/20 (Sheri [name] joint visit with Sheri [name]): Had seen occupational therapy for consultative visit, plan to attend first official appointment tomorrow, will have 1x/week for 7 weeks. Will also have physical therapy and lymphedema therapy as well. She and her friend Sam describe a lack of motivation for anything, including coming to the hospital for chemotherapy despite knowing it will be helpful. Pain was debilitating for some time but was able to take their scheduled trip, now doing somewhat better and looking forward to chemotherapy.    Interval history 04/12/2020 (Sheri [name] joint visit with Sheri [name]): Had and recovered from COVID but has a persistent cough (tends to happen with all colds since childhood, mother has always been a heavy smoker which she believes contributed); was under the impression that she was not allowed back in cancer hospital for 21 days after infection, which has led to a delay in chemo admin and worsening symptoms. She says that her pain and swelling improve with chemo and is looking forward to today's treatment for those reasons. Nausea has been under better control with tweaks to the infusion regimen and PRN Compazine. Pain meds (Tylenol and Naproxen) have been only somewhat helpful for her joint pain and swollen extremities, and she is worried about an upcoming trip and whether the pain will interfere with her ability to enjoy her travels. She has also been having mobility challenges related to swelling and joint pain. Her friend Sam wonders whether physical therapy might be helpful for providing ideas for stretches/exercises they can do together at home. They are also working on setting up a regular routine to improve daytime energy.    Interval history 03/08/20 (Sheri [name]):   -- Feeling much better overall, with significant improvement in full-body pain now that she's been on a stable dose of Naproxen and Tylenol twice a day.   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   -- Began having headaches after chemotherapy, initially delayed and most recently when a nausea medication change was made, she got a headache immediately after treatment. It improved with 1/2 Percocet though she expressed a desire to avoid opioids. Now that it's been 3 days the headache has resolved, but she would love to have a way to prevent it with future chemo sessions. Says she tries to drink extra water around chemo sessions.  -- Nausea is somewhat stable and occurs with chemo and for 2-3 days afterward. Zofran has not been very helpful so she is mostly using Compazine.   -- Mental health much improved at this time; she's staying with her mother because relationship with dad has been strained since the holidays. Friend Sam also staying with her, and they plan to stay with Sam's parents if/when they need to vacate her mom's house    Interval history 01/12/20 (Sheri [name]): Doing somewhat better overall, gabapentin trial was slightly (8-10%) helpful with arm/leg pain and possibly with sleep as well. Encouraging herself to get up and busy during the day, says she's glad she's been pushing herself. Went bowling with friends one night and  had a good time. Says she won't overdo it when she knows she can't handle it. Still using non-prescription Xanax approximately once a day, believes it's 0.5mg  per dose. Hoping to come off it at some point and open to making small incremental changes and working on that together with her medical team. Says she saw someone in [place] about her joints and was told that there is no cartilage, that it's a result of the chemotherapy she was on in the past.    Today's history 12/08/19 (Sheri [name]): Discussed her cancer history, a bad relationship that kept her from being able to participate in clinic visits and attend to her scheduled cancer care, and the fact that she is currently splitting time between both parents' homes and independently managing her medications and cancer care coordination. She shared that she had been self medicating with Xanax bars that she bought off the street and used in squares that she thinks are equivalent to 0.5mg  doses; that at times she would take more than a single dose to numb the pain even though she knew it wasn't actually affecting her pain. Symptoms as above.    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; had moved to night shifts due to issues with pain control limiting activity, picking up shifts again  Hobbies: detailing cars at the family shop  Current residence / distance from [place]: [place], [place]    Advance Care Planning:   HCPOA: not established  Natural surrogate decision maker: parents  Living Will: N/A  ACP note: none    Objective     Opioid Risk Tool:    Female  Female    Family history of substance abuse      Alcohol  1  3    Illegal drugs  2  3    Rx drugs  4  4    Personal history of substance abuse      Alcohol  3  3    Illegal drugs  4  4    Rx drugs  5  5    Age between 16--45 years  1  1    History of preadolescent sexual abuse  3  0    Psychological disease      ADD, OCD, bipolar, schizophrenia  2  2    Depression  1  1       Total: 8+ high risk  (<3 low risk, 4-7 moderate risk, >8 high risk)    Oncology History Overview Note   Left breast cT2 N0 IDC, G3, weakly ER+, PR-, HER-     Malignant neoplasm of upper-outer quadrant of left breast in female, estrogen receptor negative (CMS-HCC)   12/03/2017 -  Presenting Symptoms    Presented with self detected left breast mass x 3 weeks.  MMG/US: Left breast irregular 2.7 x 3.2 cm mass. There are faint microcalcifications associated with the mass and mild surrounding trabecular thickening is suggestive of edema.  There are  2 hyperdense and enlarged left axillary lymph nodes. No other suspicious findings are seen in the left breast.     12/03/2017 -  Other    Clinic exam: Left breast in the 1 to 2 o'clock position there is a 4 cm irregular mass. Subtle pink erythema without pitting. Left axilla with a palpable 2 cm lymph node.     12/03/2017 Biopsy    Left breast USG core biopsy: IDC, G3, ER+(5%), PR-(0), HER2-(0)  Left axillary USG core biopsy: fragments of LN negative for carcinoma     12/10/2017 Initial Diagnosis    Malignant neoplasm of upper-outer quadrant of left breast in female, estrogen receptor negative (CMS-HCC)     12/10/2017 Tumor Board    MDC recs: Left breast cT2 N0 IDC, G3, +/-/-. Very suspicious LN on imaging and exam. LN core biopsy negative. Discussed considering repeat core (although needle is definitively in the node) vs outback SLN. Will receive NACT. If node neg, plan for outback SLN. Node positivity would affect radiation recs if patient has mastectomy. Meeting genetics today. Will discuss fertility. Also discussed concerns for XRT in young patient in 20's - higher risk of complications from XRT long-term. Consider staging studies.     12/10/2017 -  Cancer Staged    Staging form: Breast, AJCC 8th Edition  - Clinical stage from 12/10/2017: Stage IIB (cT2, cN0(f), cM0, G3, ER+, PR-, HER2-) - Signed by Talbert Cage, DO on 12/10/2017       12/11/2017 Biopsy    Repeat left axillary core biopsy given clinical suspicion: LN negative for carcinoma     12/24/2017 - 02/17/2018 Chemotherapy    OP BREAST AC (DOSE DENSE)  DOXOrubicin 60 mg/m2, Cyclophosphamide 600 mg/m2 every 14 days     01/08/2018 Genetics    Patient has genetic testing done for breast cancer.  Negative [facility] Breast Cancer Guidelines-Based Panel.     02/25/2018 - 05/19/2018 Chemotherapy    OP BREAST PACLItaxel WEEKLY / CARBOplatin EVERY 3 WEEKS  PACLItaxel 80 mg/m2 IV weekly   CARBOplatin AUC 6 IV every 3 weeks   21-day cycle x 4 cycles      05/30/2018 Surgery    Left SSM: Residual IDC, Gr 2 +/-/- measuring 8mm.  Final margins are clear.  0/4 SLN with metastatic disease  Residual Cancer Burden   Tumor bed size: 8 mm x 7 mm  Overall tumor cellularity: 40%  Percentage in situ: 0%  Number of involved lymph nodes: 0  Diameter of largest metastasis: 0 mm  Residual Cancer Burden Score: 1.687  Residual Cancer Burden Class: RCB-II     06/09/2018 -  Cancer Staged    Staging form: Breast, AJCC 8th Edition  - Pathologic stage from 06/09/2018: No Stage Recommended (ypT1b, pN0(sn), cM0, G2, ER+, PR-, HER2-) - Signed by Rudie Meyer, ANP on 06/09/2018       10/23/2018 Surgery    Left chest wall excision by Dr. Dellis Anes. Final Path: IDC Grade 3 ER-PR- HER2- measuring 1 cm in greatest dimension. Final Margins clear.      11/12/2018 -  Cancer Staged    Staging form: Breast, AJCC 8th Edition  - Pathologic stage from 11/12/2018: Stage IB (rpT1b, pN0, cM0, G3, ER-, PR-, HER2-) - Signed by Talbert Cage, DO on 11/13/2018       11/10/2019 - 05/03/2020 Chemotherapy    OP BREAST GEMCITABINE (2 OF 3 WEEKS)  gemcitabine 1,000 mg/m2 IV on days 1, 8, every 21 days  08/10/2020 -  Cancer Staged    Staging form: Breast, AJCC 8th Edition  - Pathologic: Stage IV (pM1) - Signed by Donzetta Kohut, PA on 08/10/2020       09/15/2020 - 09/15/2020 Chemotherapy    OP BREAST SACITUZUMAB GOVITECAN-HZIY  sacituzumab govitecan-hziy 10 mg/kg IV on days 1, 8, every 21 days         Patient Active Problem List   Diagnosis   ??? Malignant neoplasm of upper-outer quadrant of left breast in female, estrogen receptor negative (CMS-HCC)   ??? Microcytic anemia   ??? Symptomatic anemia   ??? PTSD (post-traumatic stress disorder)   ??? 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 Anxiety disorder   ??? S/P breast reconstruction   ??? Arthralgia   ??? Nausea       Past Medical History:   Diagnosis Date   ??? Anxiety    ??? Depression    ??? Malignant neoplasm of upper-outer quadrant of left breast in female, estrogen receptor negative (CMS-HCC) 12/10/2017   ??? Panic disorder    ??? PTSD (post-traumatic stress disorder)        Past Surgical History:   Procedure Laterality Date   ??? AUGMENTATION MAMMAPLASTY Left 08/2018    left only    ??? BREAST BIOPSY Left 2019    malignant   ??? CHEMOTHERAPY Left 11/2017    May 13 2018   ??? IR INSERT PORT AGE GREATER THAN 5 YRS  12/18/2017    IR INSERT PORT AGE GREATER THAN 5 YRS 12/18/2017 Sheri Fudge, MD IMG VIR [facility]   ??? MASTECTOMY Left 05/2018   ??? PR BRONCHOSCOPY,BIOPSY Right 10/23/2019    Procedure: Bronchoscopy, Rigid/Flexible, Include Fluoro Guide When Performed; W/Bronch/Endobronch Bx, Single/Mult Site;  Surgeon: Mercy Moore, MD;  Location: MAIN OR [facility];  Service: Pulmonary   ??? PR BRONCHOSCOPY,TRANSBRON ASPIR BX Right 10/23/2019    Procedure: Bronchoscopy, Rigid/Flex, Incl Fluoro; W/Transbronch Ndl Aspirat Bx, Trachea, Main Stem &/Or Lobar Bronchus;  Surgeon: Mercy Moore, MD;  Location: MAIN OR [facility];  Service: Pulmonary   ??? PR BX/REMV,LYMPH NODE,DEEP AXILL Left 05/30/2018    Procedure: BX/EXC LYMPH NODE; OPEN, DEEP AXILRY NODE;  Surgeon: Talbert Cage, DO;  Location: ASC OR [facility];  Service: Surgical Oncology Breast   ??? PR EXC TUMOR SOFT TISSUE NECK/ANT THORAX SUBQ 3+CM Right 10/23/2018    Procedure: Priority EXCISION, TUMOR, SOFT TISSUE OF NECK OR ANTERIOR THORAX, SUBCUTANEOUS; 3 CM OR GREATER;  Surgeon: Talbert Cage, DO;  Location: ASC OR [facility];  Service: Surgical Oncology Breast   ??? PR IMPLNT BIO IMPLNT FOR SOFT TISSUE REINFORCEMENT Left 05/30/2018    Procedure: IMPLANTATION BIOLOGIC IMPLANT(EG, ACELLULAR DERMAL MATRIX) FOR SOFT TISSUE REINFORCEMENT(EG, BREAST, TRUNK);  Surgeon: Arsenio Katz, MD;  Location: ASC OR [facility];  Service: Plastics   ??? PR INSERTION BREAST IMPLANT SAME DAY OF MASTECTOMY Left 05/30/2018    Procedure: IMMED INSRT BREAST PROSTH AFTER MASTOPEX/MASTECT;  Surgeon: Arsenio Katz, MD;  Location: ASC OR [facility];  Service: Plastics   ??? PR INTRAOPERATIVE SENTINEL LYMPH NODE ID W DYE INJECTION Left 05/30/2018    Procedure: INTRAOPERATIVE IDENTIFICATION SENTINEL LYMPH NODE(S) INCLUDE INJECTION NON-RADIOACTIVE DYE, WHEN PERFORMED;  Surgeon: Talbert Cage, DO;  Location: ASC OR [facility];  Service: Surgical Oncology Breast   ??? PR MASTECTOMY, SIMPLE, COMPLETE Left 05/30/2018    Procedure: Urgent MASTECTOMY, SIMPLE, COMPLETE;  Surgeon: Talbert Cage, DO;  Location: ASC OR [facility];  Service: Surgical Oncology  Breast   ??? PR RECMPL WND TRUNK 2.6-7.5 CM Left 10/23/2018    Procedure: REPR COMPLX TRUNK; 2.6 CM TO 7.5 CM;  Surgeon: Arsenio Katz, MD;  Location: ASC OR [facility];  Service: Plastics   ??? PR REPLACEMENT TISSUE EXPANDER W/PERMANENT IMPLANT Left 09/24/2018    Procedure: REPLACEMENT OF TISSUE EXPANDER WITH PERMANENT PROSTHESIS;  Surgeon: Arsenio Katz, MD;  Location: MAIN OR [facility];  Service: Plastics   ??? PR REVISION OF RECONSTRUCTED BREAST Left 09/24/2018    Procedure: R21 - REVISION OF RECONSTRUCTED  BREAST; Surgeon: Arsenio Katz, MD;  Location: MAIN OR [facility];  Service: Plastics   ??? PR SUSPENSION OF BREAST Right 09/24/2018    Procedure: MASTOPEXY FOR SYMMETRY PURPOSES;  Surgeon: Arsenio Katz, MD;  Location: MAIN OR [facility];  Service: Plastics   ??? PR TISSUE EXPANDER PLACEMENT BREAST RECONSTRUCTION Left 05/30/2018    Procedure: BREAST RECON IMMED/DELAY W/EXPANDR W/SUBSQT EXPA;  Surgeon: Arsenio Katz, MD;  Location: [facility];  Service: Plastics       Current Outpatient Medications   Medication Sig Dispense Refill   ??? acetaminophen (TYLENOL) 325 MG tablet Take 325 mg by mouth every six (6) hours as needed for pain.     ??? benzonatate (TESSALON PERLES) 100 MG capsule Take 1 capsule (100 mg total) by mouth Three (3) times a day as needed for cough. 60 capsule 1   ??? capecitabine (XELODA) 500 MG tablet Take 4 tablets (2,000 mg total) by mouth Two (2) times a day for 14 days . Followed by 7 days off drug. 112 tablet 0   ??? ergocalciferol-1,250 mcg, 50,000 unit, (VITAMIN D2-1,250 MCG, 50,000 UNIT,) 1,250 mcg (50,000 unit) capsule Take 1 capsule (1,250 mcg total) by mouth once a week. 4 capsule 2   ??? famotidine (PEPCID) 20 MG tablet Take 1 tablet (20 mg total) by mouth Two (2) times a day. 60 tablet 11   ??? ferrous sulfate 325 (65 FE) MG tablet Take 1 tablet (325 mg total) by mouth daily. Take with orange juice. 30 tablet 3   ??? gabapentin (NEURONTIN) 300 MG capsule Take 1 capsule (300 mg total) by mouth nightly. 30 capsule 1   ??? lidocaine-prilocaine (EMLA) 2.5-2.5 % cream Apply topically once as needed for up to 1 dose. Apply small amount to port site 30 minutes - 1 hour prior to access. 30 g 0   ??? loperamide (IMODIUM) 2 mg capsule Take 2 mg by mouth 4 (four) times a day as needed.      ??? naloxone (NARCAN) 4 mg nasal spray One spray in either nostril once for known/suspected opioid overdose. May repeat every 2-3 minutes in alternating nostril til EMS arrives 2 each 1   ??? naproxen (NAPROSYN) 500 MG tablet Take 1 tablet (500 mg total) by mouth in the morning and 1 tablet (500 mg total) in the evening. Take with meals. 60 tablet 2   ??? olaparib (LYNPARZA) 150 mg tablet Take 2 tablets (300 mg total) by mouth Two (2) times a day. Swallow tablet whole. 120 capsule 5   ??? ondansetron (ZOFRAN-ODT) 4 MG disintegrating tablet Dissolve 1 tablet (4 mg total) on the tongue every eight (8) hours as needed for nausea for up to 30 doses. 30 tablet 1   ??? oxyCODONE (ROXICODONE) 15 MG immediate release tablet Take 1-2 tablets (15-30mg ) every 4-6 hours as needed for cancer related pain. 70 tablet 0   ??? pantoprazole (PROTONIX) 40 MG tablet Take 1 tablet (40  mg total) by mouth daily. 90 tablet 3   ??? polyethylene glycol (GLYCOLAX) 17 gram/dose powder Mix 17g (1 cap) in 4-8 ounces of extra liquid each day. 255 g 0   ??? predniSONE (DELTASONE) 5 MG tablet Take 1 tablet (5 mg total) by mouth in the morning. 30 tablet 1   ??? prochlorperazine (COMPAZINE) 10 MG tablet Take 1 tablet (10 mg total) by mouth every six (6) hours as needed (nausea). 30 tablet 2   ??? senna (SENNA) 8.6 mg tablet Take 1-2 tablets by mouth two (2) times a day as needed for constipation. 120 tablet 11   ??? sertraline (ZOLOFT) 100 MG tablet Take 2 tablets (200 mg total) by mouth daily. 60 tablet 2     No current facility-administered medications for this visit.       Allergies:   Allergies   Allergen Reactions   ??? Amoxicillin Other (See Comments)     EDEMA OF FACE   ??? Meloxicam Swelling and Rash     Swelling, itching, rash    ??? Shellfish Containing Products Swelling     Facial and throat swelling       Family History:  Cancer-related family history includes Skin cancer in her mother. There is no history of Breast cancer, Colon cancer, or Ovarian cancer.  She indicated that the status of her mother is unknown. She indicated that the status of her neg hx is unknown.      REVIEW OF SYSTEMS:  A comprehensive review of 10 systems was negative except for pertinent positives noted in HPI.      PHYSICAL EXAM:   VS not reviewed in Epic. Video visit. Weight had been uptrending.  GEN: Awake and alert, pleasant appearing female in no acute distress  PSYCH: Euthymic. Amiable.  HEENT: No facial asymmetry.  CV: No heave.  LUNGS: No increased work of breathing, No cough during visit.  SKIN: No rashes, petechiae or jaundice noted.    Lab Results   Component Value Date    CREATININE 0.45 (L) 09/08/2020     Lab Results   Component Value Date    ALKPHOS 190 (H) 09/08/2020    BILITOT 0.4 09/08/2020    BILIDIR <0.10 10/21/2018    PROT 7.1 09/08/2020    ALBUMIN 3.1 (L) 09/08/2020    ALT <7 (L) 09/08/2020    AST 9 09/08/2020              The patient reports they are currently: at home. I spent 14 minutes on the real-time audio and video with the patient on the date of service. I spent an additional 20 minutes on pre- and post-visit activities on the date of service.     The patient was physically located in [place] or a state in which I am permitted to provide care. The patient and/or parent/guardian understood that s/he may incur co-pays and cost sharing, and agreed to the telemedicine visit. The visit was reasonable and appropriate under the circumstances given the patient's presentation at the time.    The patient and/or parent/guardian has been advised of the potential risks and limitations of this mode of treatment (including, but not limited to, the absence of in-person examination) and has agreed to be treated using telemedicine. The patient's/patient's family's questions regarding telemedicine have been answered.     If the visit was completed in an ambulatory setting, the patient and/or parent/guardian has also been advised to contact their provider???s office for worsening conditions, and seek emergency  medical treatment and/or call 911 if the patient deems either necessary.        Mickie Hillier, MD PhD  [facility]

## 2020-10-04 NOTE — Unmapped (Signed)
It was great to see you this morning. We discussed continuing the current oxycodone plan (15-30mg  every 4-6 hours as needed) as we await discussion with the rheumatology team. I will reach out to Marc Morgans about the steroid plan and your new urinary symptoms. Will plan to see you next Tuesday morning or afternoon based on our schedules that day. We can check in Monday, or sooner if you have any new concerns.    ---    Here is some additional information about [facility] Outpatient Oncology Palliative Care:     What is Palliative Care?  Palliative Care is medical care that focuses on managing symptoms of cancer or side effects from cancer treatment. The Outpatient Oncology Palliative Care service uses a team approach to help patients and their caregivers with the goal of maintaining the best quality of life possible during a cancer diagnosis and cancer treatment.   The Outpatient Oncology Palliative Care team serves all outpatient oncology clinics including surgery, gynecology, medicine and radiation oncology.      Who provides Outpatient Palliative Care?  The Outpatient Oncology Palliative Care team includes doctors, nurses, pharmacists, social workers, counselors and nutritionists. The team works with a patient's primary oncology team to create care plans that can help manage symptoms related to cancer and cancer treatment.     What services are offered by Outpatient Oncology Palliative Care?  We can help cancer patients with:  Pain  Symptoms such as nausea, vomiting, anxiety, depression, fatigue, loss of appetite of shortness of breath  Emotional, psychological, or spiritual suffering  Nutritional problems  Concern about medical decisions  Difficulty communicating values, goals and personal choices.     Who are the Outpatient Oncology Palliative Care team members?  Physicians: Ulanda Edison, MD; Darrin Nipper, MD; Mickie Hillier, MD; Wanita Chamberlain, MD; Redgie Grayer, MD; Kathlynn Grate, MD  Physician Fellows: Sheela Stack, DO; Lanier Prude, MD; Jeronimo Greaves, MD; Barbette Merino, MD  Nurse Practitioner: Burtis Junes, FNP  Clinical Pharmacist Practitioner: Hector Shade, PharmD, CPP  Nurse Clinical Coordinator: Allegra Lai, RN, BSN, MS, OCN  Administrative Specialist: Dolores Frame     When can I see the Outpatient Oncology Palliative Care team and how do I make contact in between visits?  We see patients Monday through Friday 8am - 4:30pm. If you have questions or concerns during clinic hours, please contact us. Any requests made outside of business hours will be addressed the following business day.      Palliative Care nurse line: [phone].    For palliative care appointments & questions Monday through Friday 8 AM-- 4:30 PM:  please call [phone].    For [facility] general questions Monday through Friday 8 AM-- 4:30 PM:  call [phone] or Toll free [phone].       On Nights, Weekends and Holidays:  Call [phone] and ask for the Oncologist on call.

## 2020-10-04 NOTE — Unmapped (Signed)
Refilling oxycodone with a few additional pills due to pain needs: using 2 pills every 4-6 hours consistently. Pain somewhat increased after recent trial off steroids. Plan to transition to long-acting opioid after meeting with rheumatology later this week. Complete progress note to follow.    [name], MD PhD  [facility]

## 2020-10-05 DIAGNOSIS — N3941 Urge incontinence: Principal | ICD-10-CM

## 2020-10-05 DIAGNOSIS — R300 Dysuria: Principal | ICD-10-CM

## 2020-10-05 NOTE — Unmapped (Signed)
TC to Ms Beery to see if she has a PCP to evaluate her for the bladder spasms she's having. She does have a PCP but she states she has an appt at [facility] on Friday and asked if we could send the order there. Will place order. She will call us if she has worsening symptoms before Friday. She states now that the symptoms of the incontinence is more annoying than anything. Denies pain with urination, hematuria or fever. Merleen Nicely, PA copied on this message

## 2020-10-06 DIAGNOSIS — R32 Unspecified urinary incontinence: Principal | ICD-10-CM

## 2020-10-07 ENCOUNTER — Ambulatory Visit: Admit: 2020-10-07 | Discharge: 2020-10-08

## 2020-10-07 ENCOUNTER — Ambulatory Visit
Admit: 2020-10-07 | Discharge: 2020-10-08 | Attending: Student in an Organized Health Care Education/Training Program | Primary: Student in an Organized Health Care Education/Training Program

## 2020-10-07 ENCOUNTER — Institutional Professional Consult (permissible substitution): Admit: 2020-10-07 | Discharge: 2020-10-08

## 2020-10-07 DIAGNOSIS — C50412 Malignant neoplasm of upper-outer quadrant of left female breast: Principal | ICD-10-CM

## 2020-10-07 DIAGNOSIS — Z171 Estrogen receptor negative status [ER-]: Principal | ICD-10-CM

## 2020-10-07 DIAGNOSIS — M2550 Pain in unspecified joint: Principal | ICD-10-CM

## 2020-10-07 DIAGNOSIS — R32 Unspecified urinary incontinence: Principal | ICD-10-CM

## 2020-10-07 DIAGNOSIS — M7989 Other specified soft tissue disorders: Principal | ICD-10-CM

## 2020-10-07 LAB — CBC W/ AUTO DIFF
BASOPHILS ABSOLUTE COUNT: 0 10*9/L (ref 0.0–0.1)
BASOPHILS RELATIVE PERCENT: 0.5 %
EOSINOPHILS ABSOLUTE COUNT: 0.1 10*9/L (ref 0.0–0.5)
EOSINOPHILS RELATIVE PERCENT: 1.2 %
HEMATOCRIT: 30.2 % — ABNORMAL LOW (ref 34.0–44.0)
HEMOGLOBIN: 10 g/dL — ABNORMAL LOW (ref 11.3–14.9)
LYMPHOCYTES ABSOLUTE COUNT: 0.9 10*9/L — ABNORMAL LOW (ref 1.1–3.6)
LYMPHOCYTES RELATIVE PERCENT: 15.3 %
MEAN CORPUSCULAR HEMOGLOBIN CONC: 33.1 g/dL (ref 32.0–36.0)
MEAN CORPUSCULAR HEMOGLOBIN: 27.8 pg (ref 25.9–32.4)
MEAN CORPUSCULAR VOLUME: 84 fL (ref 77.6–95.7)
MEAN PLATELET VOLUME: 6.3 fL — ABNORMAL LOW (ref 6.8–10.7)
MONOCYTES ABSOLUTE COUNT: 0.5 10*9/L (ref 0.3–0.8)
MONOCYTES RELATIVE PERCENT: 8.6 %
NEUTROPHILS ABSOLUTE COUNT: 4.6 10*9/L (ref 1.8–7.8)
NEUTROPHILS RELATIVE PERCENT: 74.4 %
NUCLEATED RED BLOOD CELLS: 0 /100{WBCs} (ref <=4)
PLATELET COUNT: 520 10*9/L — ABNORMAL HIGH (ref 150–450)
RED BLOOD CELL COUNT: 3.59 10*12/L — ABNORMAL LOW (ref 3.95–5.13)
RED CELL DISTRIBUTION WIDTH: 18 % — ABNORMAL HIGH (ref 12.2–15.2)
WBC ADJUSTED: 6.1 10*9/L (ref 3.6–11.2)

## 2020-10-07 LAB — URINALYSIS WITH CULTURE REFLEX
BILIRUBIN UA: NEGATIVE
BLOOD UA: NEGATIVE
GLUCOSE UA: NEGATIVE
NITRITE UA: NEGATIVE
PH UA: 6 (ref 5.0–9.0)
RBC UA: 2 /HPF (ref <4)
SPECIFIC GRAVITY UA: >=1.03 (ref 1.005–1.040)
SQUAMOUS EPITHELIAL: 16 /HPF — ABNORMAL HIGH (ref 0–5)
UROBILINOGEN UA: 0.2
WBC UA: 2 /HPF (ref 0–5)

## 2020-10-07 LAB — COMPREHENSIVE METABOLIC PANEL
ALBUMIN: 2.9 g/dL — ABNORMAL LOW (ref 3.4–5.0)
ALKALINE PHOSPHATASE: 239 U/L — ABNORMAL HIGH (ref 46–116)
ALT (SGPT): <7 U/L — ABNORMAL LOW (ref 10–49)
ANION GAP: 8 mmol/L (ref 5–14)
AST (SGOT): 11 U/L (ref <=34)
BILIRUBIN TOTAL: 0.3 mg/dL (ref 0.3–1.2)
BLOOD UREA NITROGEN: 7 mg/dL — ABNORMAL LOW (ref 9–23)
BUN / CREAT RATIO: 13
CALCIUM: 8.6 mg/dL — ABNORMAL LOW (ref 8.7–10.4)
CHLORIDE: 100 mmol/L (ref 98–107)
CO2: 28.4 mmol/L (ref 20.0–31.0)
CREATININE: 0.55 mg/dL — ABNORMAL LOW
EGFR CKD-EPI (2021) FEMALE: >90 mL/min/{1.73_m2} (ref >=60)
GLUCOSE RANDOM: 147 mg/dL (ref 70–179)
POTASSIUM: 3.5 mmol/L (ref 3.4–4.8)
PROTEIN TOTAL: 7.1 g/dL (ref 5.7–8.2)
SODIUM: 136 mmol/L (ref 135–145)

## 2020-10-07 LAB — URIC ACID: URIC ACID: 5.3 mg/dL

## 2020-10-07 NOTE — Unmapped (Signed)
--Rheumatology today  --Call [phone] option # 4 [facility])          Merleen Nicely  Physician Assistant  [facility]  +++++++++++++++++++++++++++++++++++++++++++++++++++++++++++++++++++++++++++++++++++++++    Notice: Starting June 08, 2019, many test results will be automatically released into MyChart. This may happen before your provider has a chance to review them. Your results will either be reviewed with you at your scheduled appointment or your provider or someone from their office will reach out to you to discuss the results. Thank you for your understanding during this transition.      If you have been prescribed a medication today, always read the package insert that comes with the medication.     In the event of an emergency, always call 911    MyChart messages are only checked during business hours and should be used for acute concerns.     Cancer Hospital:  Phone: [phone] (nurse triage #)     After hours/nights/weekends:  Call [phone] and ask for the oncologist on call    Reasons to call emergency line may include:  Fever of 100.5 or greater  Nausea and/or vomiting not relieved with nausea medicine  Diarrhea or constipation not relieved with bowel regimen  Severe pain not relieved with usual pain regimen    Labs from today:  Clinical Support on 10/07/2020   Component Date Value Ref Range Status    Sodium 10/07/2020 136  135 - 145 mmol/L Final    Potassium 10/07/2020 3.5  3.4 - 4.8 mmol/L Final    Chloride 10/07/2020 100  98 - 107 mmol/L Final    CO2 10/07/2020 28.4  20.0 - 31.0 mmol/L Final    Anion Gap 10/07/2020 8  5 - 14 mmol/L Final    BUN 10/07/2020 7 (A) 9 - 23 mg/dL Final    Creatinine 57/84/6962 0.55 (A) 0.60 - 0.80 mg/dL Final    BUN/Creatinine Ratio 10/07/2020 13   Final    eGFR CKD-EPI (2021) Female 10/07/2020 >90  >=60 mL/min/1.55m2 Final    Glucose 10/07/2020 147  70 - 179 mg/dL Final    Calcium 95/28/4132 8.6 (A) 8.7 - 10.4 mg/dL Final    Albumin 44/02/270 2.9 (A) 3.4 - 5.0 g/dL Final    Total Protein 10/07/2020 7.1  5.7 - 8.2 g/dL Final    Total Bilirubin 10/07/2020 0.3  0.3 - 1.2 mg/dL Final    AST 53/66/4403 11  <=34 U/L Final    ALT 10/07/2020 <7 (A) 10 - 49 U/L Final    Alkaline Phosphatase 10/07/2020 239 (A) 46 - 116 U/L Final    WBC 10/07/2020 6.1  3.6 - 11.2 10*9/L Final    RBC 10/07/2020 3.59 (A) 3.95 - 5.13 10*12/L Final    HGB 10/07/2020 10.0 (A) 11.3 - 14.9 g/dL Final    HCT 47/42/5956 30.2 (A) 34.0 - 44.0 % Final    MCV 10/07/2020 84.0  77.6 - 95.7 fL Final    MCH 10/07/2020 27.8  25.9 - 32.4 pg Final    MCHC 10/07/2020 33.1  32.0 - 36.0 g/dL Final    RDW 38/75/6433 18.0 (A) 12.2 - 15.2 % Final    MPV 10/07/2020 6.3 (A) 6.8 - 10.7 fL Final    Platelet 10/07/2020 520 (A) 150 - 450 10*9/L Final    nRBC 10/07/2020 0  <=4 /100 WBCs Final    Neutrophils % 10/07/2020 74.4  % Final    Lymphocytes % 10/07/2020 15.3  % Final  Monocytes % 10/07/2020 8.6  % Final    Eosinophils % 10/07/2020 1.2  % Final    Basophils % 10/07/2020 0.5  % Final    Absolute Neutrophils 10/07/2020 4.6  1.8 - 7.8 10*9/L Final    Absolute Lymphocytes 10/07/2020 0.9 (A) 1.1 - 3.6 10*9/L Final    Absolute Monocytes 10/07/2020 0.5  0.3 - 0.8 10*9/L Final    Absolute Eosinophils 10/07/2020 0.1  0.0 - 0.5 10*9/L Final    Absolute Basophils 10/07/2020 0.0  0.0 - 0.1 10*9/L Final    Anisocytosis 10/07/2020 Slight (A) Not Present Final

## 2020-10-07 NOTE — Unmapped (Signed)
Staff messages for coordination of care      Minette Headland, MD  Donzetta Kohut, [place]; Herminio Commons [place], [place]; Willeen Niece, MD  I spoke with Sheri [name] this AM. The oxycodone is working well, and I intend to transition to a long-acting opioid medication in the future. Meanwhile I didn't want to make any big changes before Rheum.     She did say that she notices the pain is a bit worse off steroids. This will be useful for rheum to know.     She also has a peculiar new symptom of presumed bladder spasm/urge incontinence. I hope someone from the onc team can discuss this with her soon, as she said she has been urinating on herself in the morning before she can make it to the bathroom!     I refilled her meds today and will see her again in 1 week. Please keep me posted on how things go with her visits later this week. Thanks so much!     Raynelle Fanning       ??     Previous Messages    ??  ----- Message -----   From: Donzetta Kohut, PA   Sent: 09/21/2020 ?? 8:50 AM EDT   To: Willeen Niece, MD, *   Subject: RE: update ?? ?? ?? ?? ?? ?? ?? ?? ?? ?? ?? ?? ?? ?? ?? ?? ??     Such good news to hear.     I would love to get her off the prednisone. ??I will send her a mychart message about the taper. ??I wouldn't think the eyelid swelling would be xeloda; maybe the LE edema.     Happy to refill the oxycodone; I am seeing her on 8/12. ??Is that something I could do when she comes to the visit or on the Tuesday 8/9. ??I am off next week but back on 8/8 :)   ----- Message -----   From: Minette Headland, MD   Sent: 09/20/2020 ?? 5:02 PM EDT   To: Donzetta Kohut, PA, *   Subject: RE: update ?? ?? ?? ?? ?? ?? ?? ?? ?? ?? ?? ?? ?? ?? ?? ?? ??     Hey there,     Just had a nice VV with Sheri [name].     Her pain is very well controlled on 1-2 tabs of 15mg  oxycodone every 4-6 hours, sometimes spaces it out even more, has not used the fully supply that I prescribed last week at this time.     We agreed that she will continue to keep a daily pain log and stay on the same regimen, no benzodiazepine medications for now (was using an old Ativan Rx because had some anxiety during first few days on consistent opioids but anxiety has settled back down, and I had approved that plan).     She will increase her bowel regimen and is planning a trial off BID Naproxen (just to spare her stomach, maybe get off the famotidine as well).     Alan Ripper - I wonder if she can come off the prednisone too and see how she does? In anticipation of seeing Rheum soon? I said I'd run that by you.     She did mention some new eyelid swelling and slightly worsened extremity edema (chemo SEs?) and nausea (likely opioid SE as has improved).     Either I or one of my colleagues will plan  a video visit with her next Tuesday prior to next oxycodone prescription. Then I am thinking perhaps you can refill for her the following week? I'll be on vacation but can make time for video visits with her if/when needed, and you both can feel free to be in touch with me anytime.     I am still hopeful that Rheum has some answers for [place] and am looking forward to that visit finally happening!!! Thank you for being persistent on her behalf!     Talk soon.   Raynelle Fanning           ----- Message -----   From: Donzetta Kohut, PA   Sent: 09/13/2020 ?? 8:42 AM EDT   To: Minette Headland, MD, *   Subject: FW: update ?? ?? ?? ?? ?? ?? ?? ?? ?? ?? ?? ?? ?? ?? ?? ?? ??     FYI ~ Jimmy Stipes messaged me over the weekend wanting to do xeloda (oral med) instead of sacituzumab. ??We have started that process.     ----- Message -----   From: Donzetta Kohut, PA   Sent: 09/09/2020 ?? 2:26 PM EDT   To: Minette Headland, MD, *   Subject: update ?? ?? ?? ?? ?? ?? ?? ?? ?? ?? ?? ?? ?? ?? ?? ?? ?? ?? ??     Hi all,     I saw Sheri [name] today and we plan to start IV chemotherapy with sacituzumab (2 week on f/b 1 week off schedule) on 7/22 out in [facility]. 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     She is still having significant pain/swelling in joints which is impacting her ADL/QOL. ??She cried multiple times during our visit about the pain. ??Medrol dose packs have helped but pain relief only lasts 5 days and the prednisone 5 mg hasn't been helpful thus far. ??I have placed another e-consult for rheum to see if they have any other thoughts given her visit isn't until Feb 2023.     She asked about stronger pain medications and is upset that taking xanax not prescribed for her previously has precluded her from getting stronger pain medications. ??I know you are meeting with her on Tuesday and I am wondering if you would be willing to prescribe narcotics to her. ??Since she will be coming to [facility] more frequently it could be small amounts so there is tight control. ??The other option I came up with was pain clinic.     Let me know what you think. ??I let her know that I was going to send this message.     Alan Ripper
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
## 2020-10-07 NOTE — Unmapped (Signed)
Labs drawn via [place].    Port flushed and brisk blood return     Pt tolerated well.

## 2020-10-07 NOTE — Unmapped (Signed)
Great to meet you today! We will plan to obtain some X-rays to better evaluate what is going on with your joints.

## 2020-10-07 NOTE — Unmapped (Signed)
Rheumatology Clinic Initial Visit Note     Reason for consult: Sheri [name] is seen in consultation at the request of Dr. Donzetta Kohut for evaluation of 1. Generalized joint pain 2. Swelling of both hands 3. Foot swelling    Assessment and Plan: Sheri [name] is a 25 y.o.  female with a PMH of metastatic breast CA (lung, bone), anxiety, PTSD who presents today with polyarthralgia.    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 Had additionally considered RS3PE based on malignancy history and soft tissue swelling, although there was no synovitis seen on ultrasound. Lack of synovitis on [place] also argues against a seronegative (she is -ANA, -RF) inflammatory arthritis. Crystalline disease can have intermittent synovitis although no chondrocal seen on plain film and uric acid is 5.3.     Treatment of HOA centers on removing inciting factor (her breast CA) with use of NSAIDs and occasionally bisphosphonate. She is already taking Naproxen, will discuss trial of bisphosphonates with oncology team.  - Had obtained XR hands and feet, uric acid but updated assessment above after these resulted  - Will discuss possible bisphosphonate with oncology team    No follow-ups on file.      Patient was seen and discussed with Dr. Eulis Foster, MD  Rheumatology Fellow PGY4    HPI: Sheri [name] is a 25 y.o.  female with a PMH of metastatic breast CA (lung, bone), anxiety, PTSD who presents today with polyarthralgia.    First MSK pain started in spring of 2021. Thighs were the first thing that hurt. Felt associated weakness when she tried to stand or sit. At first she felt she could try to keep it under control with advil and tylenol. Progressed over the following months with swelling in ankles and feet. Then felt she was getting weak all over. Talked with her oncologist, felt it was related to chemo. Was advised to keep taking her OTC pain relievers. Then was told she had mets to her lungs and started on an IV chemo. She knows that the IV chemo would make her swelling go down for about 2 weeks. Thought this was due to the steroid with the infusion. Then took her off this, now on something else that is not with steroids. Noticed the swelling got a lot worse after this switch. Got a steroid packs PRN over the following months. Then placed on 5mg  for a longer time, tapered off this 2 weeks ago. Feels the MSK pain is fairly constant although some days are worse than others. Stiffness can last 2hrs to all day, tends to be in all joints.    Occasional night sweats that wax and wane, she has attributed this to her cancer. Patient denies fevers, chills unintentional weight loss, rashes, sore throat, uveitis, sicca symptoms, Raynaud's, digital ulcerations, nail changes, chest pain, palpitations, SOB, cough, abd pain, N/V/D, BRBPR, melena, urinary changes (color, frank blood, foamy, frequency), or focal weakness/numbness/tingling.      ROS: 14 systems were reviewed and are negative except for that mentioned in the HPI.     Allergies: Amoxicillin, Meloxicam, and Shellfish containing products  Immunizations:   Immunization History   Administered Date(s) Administered   ??? Influenza Vaccine Quad (IIV4 PF) 14mo+ injectable 12/24/2017, 12/22/2019      PMHx:   Past  Medical History:   Diagnosis Date   ??? Anxiety    ??? Depression    ??? Malignant neoplasm of upper-outer quadrant of left breast in female, estrogen receptor negative (CMS-HCC) 12/10/2017   ??? Panic disorder    ??? PTSD (post-traumatic stress disorder)      PSHx:   Past Surgical History:   Procedure Laterality Date   ??? AUGMENTATION MAMMAPLASTY Left 08/2018    left only    ??? BREAST BIOPSY Left 2019    malignant   ??? CHEMOTHERAPY Left 11/2017    May 13 2018   ??? IR INSERT PORT AGE GREATER THAN 5 YRS  12/18/2017    IR INSERT PORT AGE GREATER THAN 5 YRS 12/18/2017 [name], MD IMG [facility]   ??? MASTECTOMY Left 05/2018   ??? PR BRONCHOSCOPY,BIOPSY Right 10/23/2019    Procedure: Bronchoscopy, Rigid/Flexible, Include Fluoro Guide When Performed; W/Bronch/Endobronch Bx, Single/Mult Site;  Surgeon: Mercy Moore, MD;  Location: MAIN OR [facility];  Service: Pulmonary   ??? PR BRONCHOSCOPY,TRANSBRON ASPIR BX Right 10/23/2019    Procedure: Bronchoscopy, Rigid/Flex, Incl Fluoro; W/Transbronch Ndl Aspirat Bx, Trachea, Main Stem &/Or Lobar Bronchus;  Surgeon: Mercy Moore, MD;  Location: MAIN OR [facility];  Service: Pulmonary   ??? PR BX/REMV,LYMPH NODE,DEEP AXILL Left 05/30/2018    Procedure: BX/EXC LYMPH NODE; OPEN, DEEP AXILRY NODE;  Surgeon: Talbert Cage, DO;  Location: ASC OR [facility];  Service: Surgical Oncology Breast   ??? PR EXC TUMOR SOFT TISSUE NECK/ANT THORAX SUBQ 3+CM Right 10/23/2018    Procedure: Priority EXCISION, TUMOR, SOFT TISSUE OF NECK OR ANTERIOR THORAX, SUBCUTANEOUS; 3 CM OR GREATER;  Surgeon: Talbert Cage, DO;  Location: ASC OR [facility];  Service: Surgical Oncology Breast   ??? PR IMPLNT BIO IMPLNT FOR SOFT TISSUE REINFORCEMENT Left 05/30/2018    Procedure: IMPLANTATION BIOLOGIC IMPLANT(EG, ACELLULAR DERMAL MATRIX) FOR SOFT TISSUE REINFORCEMENT(EG, BREAST, TRUNK);  Surgeon: Arsenio Katz, MD;  Location: ASC OR [facility];  Service: Plastics   ??? PR INSERTION BREAST IMPLANT SAME DAY OF MASTECTOMY Left 05/30/2018    Procedure: IMMED INSRT BREAST PROSTH AFTER MASTOPEX/MASTECT;  Surgeon: Arsenio Katz, MD;  Location: ASC OR [facility];  Service: Plastics   ??? PR INTRAOPERATIVE SENTINEL LYMPH NODE ID W DYE INJECTION Left 05/30/2018    Procedure: INTRAOPERATIVE IDENTIFICATION SENTINEL LYMPH NODE(S) INCLUDE INJECTION NON-RADIOACTIVE DYE, WHEN PERFORMED;  Surgeon: Talbert Cage, DO;  Location: ASC OR [facility];  Service: Surgical Oncology Breast   ??? PR MASTECTOMY, SIMPLE, COMPLETE Left 05/30/2018    Procedure: Urgent MASTECTOMY, SIMPLE, COMPLETE;  Surgeon: Talbert Cage, DO;  Location: ASC OR [facility];  Service: Surgical Oncology Breast   ??? PR RECMPL WND TRUNK 2.6-7.5 CM Left 10/23/2018    Procedure: REPR COMPLX TRUNK; 2.6 CM TO 7.5 CM;  Surgeon: Arsenio Katz, MD;  Location: ASC OR [facility];  Service: Plastics   ??? PR REPLACEMENT TISSUE EXPANDER W/PERMANENT IMPLANT Left 09/24/2018    Procedure: REPLACEMENT OF TISSUE EXPANDER WITH PERMANENT PROSTHESIS;  Surgeon: Arsenio Katz, MD;  Location: MAIN OR [facility];  Service: Plastics   ??? PR REVISION OF RECONSTRUCTED BREAST Left 09/24/2018    Procedure: R21 - REVISION OF RECONSTRUCTED  BREAST;  Surgeon: Arsenio Katz, MD;  Location: MAIN OR [facility];  Service: Plastics   ??? PR SUSPENSION OF BREAST Right 09/24/2018    Procedure: MASTOPEXY FOR SYMMETRY PURPOSES;  Surgeon: Arsenio Katz, MD;  Location: MAIN OR [facility];  Service: Plastics   ??? PR  TISSUE EXPANDER PLACEMENT BREAST RECONSTRUCTION Left 05/30/2018    Procedure: BREAST RECON IMMED/DELAY W/EXPANDR W/SUBSQT EXPA;  Surgeon: Arsenio Katz, MD;  Location: ASC OR [facility];  Service: Plastics     Family Hx:   Family History   Problem Relation Age of Onset   ??? Skin cancer Mother    ??? Breast cancer Neg Hx    ??? Colon cancer Neg Hx    ??? Endometrial cancer Neg Hx    ??? Ovarian cancer Neg Hx      Social Hx:   Social History     Socioeconomic History   ??? Marital status: Single   Tobacco Use   ??? Smoking status: Never Smoker   ??? Smokeless tobacco: Never Used   ??? Tobacco comment: lives in a home with smokers   Vaping Use   ??? Vaping Use: Never used   Substance and Sexual Activity   ??? Alcohol use: Not Currently   ??? Drug use: Never   Social History Narrative    The patient is single.  She lives at home with her mother and brother       Medications:   Current Outpatient Medications   Medication Sig Dispense Refill   ??? acetaminophen (TYLENOL) 325 MG tablet Take 325 mg by mouth every six (6) hours as needed for pain.     ??? benzonatate (TESSALON PERLES) 100 MG capsule Take 1 capsule (100 mg total) by mouth Three (3) times a day as needed for cough. 60 capsule 1   ??? capecitabine (XELODA) 500 MG tablet Take 4 tablets (2,000 mg total) by mouth Two (2) times a day for 14 days . Followed by 7 days off drug. 112 tablet 0   ??? ergocalciferol-1,250 mcg, 50,000 unit, (VITAMIN D2-1,250 MCG, 50,000 UNIT,) 1,250 mcg (50,000 unit) capsule Take 1 capsule (1,250 mcg total) by mouth once a week. 4 capsule 2   ??? famotidine (PEPCID) 20 MG tablet Take 1 tablet (20 mg total) by mouth Two (2) times a day. 60 tablet 11   ??? ferrous sulfate 325 (65 FE) MG tablet Take 1 tablet (325 mg total) by mouth daily. Take with orange juice. 30 tablet 3   ??? gabapentin (NEURONTIN) 300 MG capsule Take 1 capsule (300 mg total) by mouth nightly. 30 capsule 1   ??? lidocaine-prilocaine (EMLA) 2.5-2.5 % cream Apply topically once as needed for up to 1 dose. Apply small amount to port site 30 minutes - 1 hour prior to access. 30 g 0   ??? loperamide (IMODIUM) 2 mg capsule Take 2 mg by mouth 4 (four) times a day as needed.      ??? naloxone (NARCAN) 4 mg nasal spray One spray in either nostril once for known/suspected opioid overdose. May repeat every 2-3 minutes in alternating nostril til EMS arrives 2 each 1   ??? naproxen (NAPROSYN) 500 MG tablet Take 1 tablet (500 mg total) by mouth in the morning and 1 tablet (500 mg total) in the evening. Take with meals. 60 tablet 2   ??? ondansetron (ZOFRAN-ODT) 4 MG disintegrating tablet Dissolve 1 tablet (4 mg total) on the tongue every eight (8) hours as needed for nausea for up to 30 doses. 30 tablet 1   ??? oxyCODONE (ROXICODONE) 15 MG immediate release tablet Take 1-2 tablets (15-30mg ) every 4-6 hours as needed for cancer related pain. 70 tablet 0   ??? pantoprazole (PROTONIX) 40 MG tablet Take 1 tablet (40 mg total) by mouth daily. 90 tablet 3   ???  polyethylene glycol (GLYCOLAX) 17 gram/dose powder Mix 17g (1 cap) in 4-8 ounces of extra liquid each day. 255 g 0   ??? prochlorperazine (COMPAZINE) 10 MG tablet Take 1 tablet (10 mg total) by mouth every six (6) hours as needed (nausea). 30 tablet 2   ??? senna (SENNA) 8.6 mg tablet Take 1-2 tablets by mouth two (2) times a day as needed for constipation. 120 tablet 11   ??? sertraline (ZOLOFT) 100 MG tablet Take 2 tablets (200 mg total) by mouth daily. 60 tablet 2   ??? predniSONE (DELTASONE) 5 MG tablet Take 1 tablet (5 mg total) by mouth in the morning. (Patient not taking: Reported on 10/07/2020) 30 tablet 1     No current facility-administered medications for this visit.       Physical Exam:    Vitals:    10/07/20 1455   BP: 149/86   BP Site: R Wrist   BP Position: Sitting   BP Cuff Size: Small   Pulse: 113   Temp: 36.5 ??C (97.7 ??F)   TempSrc: Temporal   Weight: (!) 139.8 kg (308 lb 3.2 oz)     GEN:  In no acute distress.  Well nourished and well kempt. Pleasant.  Head: No alopecia. Kimberly/AT.  Eyes: EOMI. PERRL. No conjunctival injection.  Ears: Normal external exam. Auricles nontender.  Nose/Oral:  No eyrthema in the nares or nasal polyps.  Clear oropharynx. No oral ulcers.   Neck:  Supple, no JVD at 90 degrees.   Lymph: No cervical or supraclavicular lymphadenopathy.  Chest: Good respiratory effort with symmetrical chest expansion.  CTAB.  Cardiovascular: RRR, normal s1/s2, no murmurs, rubs, or gallops appreciated.  2+ distal pulses equal bilaterally. 2+ pitting edema in LEs to knee.  GI:  Soft, NT, ND, normal bowel sounds.  MSK:     Hands: Some tenderness diffusely over hand and bony/musclar areas. Diffuse soft tissue swelling, difficulty ascertaining focal joint swelling or bogginess. Unable to make tight fist.    Wrists: Non-tender. Diffuse soft tissue swelling without focal articular swelling/synovitis. Full ROM.    Elbows: Non-tender. No swelling/synovitis. Full ROM.    Shoulders: Non-tender. Full ROM.    Neck: Non-tender to mid-line palpation. Full ROM.    Back: Non-tender to mid-line palpation & SI joints. No deformities. Full ROM.    Hips: Tenderness over greater trochanters and thighs with deep palpation. Full ROM.    Knees: Non-tender. No swelling/synovitis. Full ROM.    Ankles: 2+ pitting edema from midshin down limits assessment of focal articular swelling. No focal tenderness, erythema, or warmth. Full ROM.   Feet: Non-tender. No swelling/synovitis. Full ROM.   Neuro:  AAO x 3.  No focal neurologic deficits. Strength 5/5 bilaterally in upper and lower extremities.  Skin:  No rashes, nodules or dry skin noted.  No petichiae or purpura.  No cyanosis or clubbing.  Psych: Normal mood and affect. Normal thought process.

## 2020-10-07 NOTE — Unmapped (Signed)
[facility] Clinical Intervention    Type of intervention: Triage error    Medication involved: Capecitabine    Problem identified: medication did not go out as scheduled    Intervention performed: patient called in to see why her capecitabine was not delivered on 10/03/20 as previously set up.  Triage canceled the work order in error when a new rx was received on 09/29/20 and the medication was not delivered on 10/03/20 as previously set up.  Patient will now be late in restarting her capecitabine    Follow-up needed: will fill out a SAFE report since patient will miss doses    Approximate time spent: 15-20 minutes    Clinical evidence used to support intervention: Professional judgement    Rollen Sox   [facility] Specialty Pharmacist      PATIENT WILL NOW RECEIVE HER DELIVERY ON 10/07/20

## 2020-10-11 ENCOUNTER — Telehealth: Admit: 2020-10-11 | Discharge: 2020-10-12 | Payer: MEDICAID

## 2020-10-11 MED ORDER — MORPHINE ER 60 MG TABLET,EXTENDED RELEASE
ORAL_TABLET | Freq: Two times a day (BID) | ORAL | 0 refills | 7 days | Status: CP
Start: 2020-10-11 — End: 2020-10-18

## 2020-10-11 MED ORDER — OXYCODONE 15 MG TABLET
ORAL_TABLET | 0 refills | 0 days | Status: CP
Start: 2020-10-11 — End: ?

## 2020-10-11 NOTE — Unmapped (Signed)
Call pt. For virtual visit. Reviewed allergy, medication, and pharmacy.  Answered all question regarding virtual appointment. Pt verbalize time for appt. No other questions or concerns voiced. Provider was on site and pt was at home for this video appt.

## 2020-10-11 NOTE — Unmapped (Signed)
OUTPATIENT ONCOLOGY PALLIATIVE CARE    Principal Diagnosis: Sheri [name] is a 25 y.o. female with metastatic L breast cancer, diagnosed in October 2019. Disease sites include breast and both lungs.     Assessment/Plan:   1. 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 Pain control regimen somewhat less effective off steroids. We initiated opioid medications on 09/13/20 and discussed the importance of using only what is prescribed, keeping track of doses used (number per day and specific dose needed), not allowing others access to the medications, not using any other opioid or non-opioid medications prescribed to others, communicating about changes in pain needs other other substances being used outside of what has been prescribed.  -- Continue Tylenol 1,000mg  q8h  -- Continue Naproxen  -- Transition to Sheri-Contin 60mg  BID, anticipate increase to TID to satisfy current OMEs but wouldn't want to overshoot  -- Continue oxycodone 15-30mg  q4-6h PRN pain, will decrease supply to account for long acting medication.  -- Continue to keep app of doses/times/pain characteristics  -- No longer taking gabapentin because was not effective  -- Now off daily 5mg  prednisone, may benefit from resumption or     2. Opioid-induced constipation, improved due to side effects of chemotherapy:  -- Continue Miralax and Senna as needed  -- Discussed titrating to one soft BM daily while on opioid medications    3. Cough: stable; likely related to lung metastases, responds to Tessalon, also exacerbated by reflux  -- Tessalon 100mg  BID-TID PRN, effective at this dose  -- Famotidine 20mg  BID PRN, using after symptoms arise/with other meds, may improve if remains off NSAIDs  -- May respond to oxycodone as above  -- Continue to monitor for changes    4. Depression and anxiety: had been self-medicating with Xanax, not currently using as dealers were arrested and jailed, has tried Ativan (effective) and Klonopin (ineffective, seemed to heighten anxiety/panic episodes)  -- Continue sertraline 200mg  daily  -- No new benzodiazapine medications for now, will monitor anxiety and consider new depression/anxiety regimen at later date as discussed  -- Consider olanzapine at future date, possibly other adjuncts to help with lack of motivation    5. Nausea: occurs with chemotherapy and early days of opioid use, improved with most recent modifications  -- Continue Compazine PRN  -- Avoid Zofran as was contributing to headaches    6. Mobility issues: getting out of bed and getting dressed has been burdensome, awaiting PT/OT sessions    7. Urge incontinence: resolved.    8. GOC: did not discuss in detail today; looking forward to saving up for her own rental home.     # Controlled substances risk management.   - Patient has a signed pain medication agreement with Outpatient Palliative Care, completed on 09/13/20, as per standard of care. Pain contract discussed in detail, including her high risk status and our inability to prescribe if any future red flags. She uses a paisley medicine bag for Zoloft, Tylenol, heartburn medication etc. and will use this bag for opioids and keep it in her presence at all times.   - [place] database was reviewed today and it was appropriate. No benzodiazapine prescriptions since 2020   - Urine drug  screen was not performed at this visit. Findings: not applicable.   - Patient has received information about safe storage and administration of medications.   - Patient has received a prescription for narcan; has been educated on its use. Patient's caregiver/friend Sam was not present, but she was encouraged to discuss with her.      F/u: WEEKLY    ----------------------------------------  Referring Provider: Dr Avis Epley  Oncology Team:   Medical Oncologist: Dr. Marc Morgans / Louann Liv  Surgical Oncologist: Dr. [name]   Radiation Oncologist: Dr. Zacarias Pontes   PCP: No PCP Per Patient      HPI: Sheri [name] is a 25 y.o. woman with left breast cancer metastatic to lungs. She was initially diagnosed with local disease in October 2019 and completed chemotherapy in March 2020. Was found to have residual disease at time of mastectomy in April 2020 and again at time of reconstruction, and additional surgical resection was required. Restaging scans in August 2020 did not reveal metastatic disease, and she completed adjuvant radiation in December 2020. She was scheduled to continue capecitabine after radiation but due to a complicated psychosocial situation this did not occur. She was found to have metastatic disease in August 2021 when she presented with hemoptysis and cough and was found to have bilateral lung involvement.     Current cancer-directed therapy: Xeloda    Interval events (Sheri [name] video visit) 10/11/20:  -- Pain still ranges from 7-7.5 and down to 2-3 after oxycodone kicks in  -- Continues to take only 1 tab if planning to do any driving  -- BMs every 2-3 days, Miralax is at her mom's house where she plans to be later today, will resume use  -- No more urinary symptoms  -- Saw Rheum, received a diagnosis of hypertrophic osteoarthropathy, unsure when she will receive treatment, glad she has a diagnosis    Interval events (Sheri [name], video visit), 10/04/20:  -- Pain remains well controlled on oxycodone 30mg  q4-6h when used consistently  -- Slightly more pain off steroids  -- Will wake up stiff and in pain when no recent overnight dose of oxycodone  -- Has been sleeping more regular hours past few nights, feels good  -- No constipation as diarrhea is a side effect of current chemotherapy regimen  -- Has been having a few days of urinary urgency and incontinence, wondering if related to opioids or something else; says she will wake up stiff and achy and unable to make it to bathroom before urinating spontaneously.    Interval history (Sheri [name], video visit), 09/20/20:  -- Pain very well controlled with oxycodone, has been taking notes because couldn't find an app; has used it less than prescribed  -- Will sometimes take 1 tab to avoid being too sleepy (if she was going to be driving our out and about)  -- Slept a lot after the first few days, now much more manageable  -- Pain described as pulsating and a 6 or 7/10 when she decides to take medication, will notice it first in ankles and wrists  -- Rates her pain as 0-2/10 about an hour after taking 2 tabs of oxycodone, will go down to 3.5 or 4/10 with one tablet  Per pain journal:       -- Tuesday 7/19: 2 tabs at 9pm       -- Wednesday 7/20: 1 tab at 1am, 1 tab at 11am, 1 tab at 2pm, 2 tabs at 6pm       --  Thursday 7/21:  2 tabs at MN, 2 tabs at 6am, 2 tabs at 2pm       -- Friday 7/22: 1 tab at 1am, 1 tab at 2am, 1 tab at 5am, 2 tabs at 11am, 2 tabs at 4pm, 2 tabs at 9pm       -- Saturday 7/23: 2 tabs at 3am, 2 tabs at 11am, 2 tabs at 6pm       -- Sunday 7/24: 2 tabs at 2am, 2 tabs at 6am, 2 tabs at 12pm, 2 tabs at 4pm, 1 tab at 9pm       -- Monday 7/25: 2 tabs at 3am, 2 tabs at 9am, 2 tabs at 1pm, 2 tabs at 5pm       -- Tuesday 7/26: 2 tabs at 1am, 2 tabs at 7am, 2 at 2pm  -- She has 8 tablets remaining, thinks she may have missed writing down one dose of one pill at some point  -- Some eyelid swelling for past 4-5 days, had started chemotherapy a few days prior to that  -- Has also been somewhat nauseated, wasn't sure if it was from chemo or oxycodone, compazine has been helpful  -- Keeps a funny sleep schedule (goes to bed around 0500 and wakes up early afternoon, will get up earlier for meds and then go back to sleep) and sleeps ok, falls asleep easily  -- Notes slightly more swelling but believes it to be a side effect of chemotherapy  -- Mobility much better and not waking up with as much stiffness/pain in her joints  -- Anxiety had been heightened in the first few days after starting oxycodone, used Ativan because worried about having a panic attack, has been better since then.    Interval events (Sheri [name]), 09/13/20:  -- Pain described as all over my body and dull/throbbing, 8+/10 at its worst, will lie in bed crying when unable to capture it with current regimen  -- Will schedule meals around med schedule, because her medications are more effective on an empty stomach  -- Has tried percocet and morphine (most recently on Saturday), will take the pain down to 2/10 but only when taking 30mg  oxycodone dose  -- No more headaches, possibly from Zofran and/or prior chemo  -- Anxiety has been fluctuating, worse last week after scans/results, taking Ativan from prior prescription, has approximately 6 tablets left, thinks they are 1mg  dose  -- Still on a stable dose of Zoloft 200mg  which helps with baseline depression/anxiety, but her pain has been exacerbating her depression  -- Using a sleep aid (diphenhydramine), did not know it was a Benadryl equivalent  -- Been living with her mom for past several weeks brother there too but not much interaction, will stay with friend Sam for 1-2 weeks at a time and remain on good terms  -- Mom has alcoholism and paranoia, tends to fixate on certain topics, worse when it's focused on Sheri Kole herself, currently not an active issue for past few weeks  -- Talked about therapy as not having a lasting benefit but open to setting up a more stable counseling plan with Santina Evans if it can build in some flexibility based on her pain and mobility issues       Symptom Review (January 2022):  General: doing poorly overall, has had some debilitating pain in preceding weeks, unclear etiology  Pain: full-body pain has been worse in last few weeks, will improve in week or so after chemo but then become worse,  described as pain in wrists and ankles, also mid/upper back  Headaches: resolved, were present with last 3 cycles, frontal in nature and very intense, would occur 48-72 hours after tx but most recently occurred immediately following treatment. No response to Naproxen/Tylenol, did seem to respond to acetaminophen/oxycodone.  Fatigue: worse  Mobility: worse, has had discomfort with movement  Sleep: improved quality, will sleep all day after chemo  Appetite: fluctuates with pain  Nausea: present with and after, will take antiemetic meds for 1-2 days post chemo  Bowel function: regular, no current issues with constipation/hemorrhoids/bleeding  Dyspnea: not an issue, does have a fluctuating cough  Secretions: not an issue  Mood: somewhat stable on sertraline, has been depressed due to pain  Edema: fluctuates but especially bad at this time, off steroids    Interval history 05/03/20 (Rosalin Buster/Swift joint visit with Beckie Busing): Had seen occupational therapy for consultative visit, plan to attend first official appointment tomorrow, will have 1x/week for 7 weeks. Will also have physical therapy and lymphedema therapy as well. She and her friend Sam describe a lack of motivation for anything, including coming to the hospital for chemotherapy despite knowing it will be helpful. Pain was debilitating for some time but was able to take their scheduled trip, now doing somewhat better and looking forward to chemotherapy.    Interval history 04/12/2020 (Adaria Hole/Swift joint visit with Urban): Had and recovered from COVID but has a persistent cough (tends to happen with all colds since childhood, mother has always been a heavy smoker which she believes contributed); was under the impression that she was not allowed back in cancer hospital for 21 days after infection, which has led to a delay in chemo admin and worsening symptoms. She says that her pain and swelling improve with chemo and is looking forward to today's treatment for those reasons. Nausea has been under better control with tweaks to the infusion regimen and PRN Compazine. Pain meds (Tylenol and Naproxen) have been only somewhat helpful for her joint pain and swollen extremities, and she is worried about an upcoming trip and whether the pain will interfere with her ability to enjoy her travels. She has also been having mobility challenges related to swelling and joint pain. Her friend Sam wonders whether physical therapy might be helpful for providing ideas for stretches/exercises they can do together at home. They are also working on setting up a regular routine to improve daytime energy.    Interval history 03/08/20 (Sheri [name]/Swift):   -- Feeling much better overall, with significant improvement in full-body pain now that she's been on a stable dose of Naproxen and Tylenol twice a day.   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   -- Began having headaches after chemotherapy, initially delayed and most recently when a nausea medication change was made, she got a headache immediately after treatment. It improved with 1/2 Percocet though she expressed a desire to avoid opioids. Now that it's been 3 days the headache has resolved, but she would love to have a way to prevent it with future chemo sessions. Says she tries to drink extra water around chemo sessions.  -- Nausea is somewhat stable and occurs with chemo and for 2-3 days afterward. Zofran has not been very helpful so she is mostly using Compazine.   -- Mental health much improved at this time; she's staying with her mother because relationship with dad has been strained since the holidays. Friend Sam also staying with  her, and they plan to stay with Sam's parents if/when they need to vacate her mom's house    Interval history 01/12/20 (Abdulmalik Darco/Steele/Swift): Doing somewhat better overall, gabapentin trial was slightly (8-10%) helpful with arm/leg pain and possibly with sleep as well. Encouraging herself to get up and busy during the day, says she's glad she's been pushing herself. Went bowling with friends one night and had a good time. Says she won't overdo it when she knows she can't handle it. Still using non-prescription Xanax approximately once a day, believes it's 0.5mg  per dose. Hoping to come off it at some point and open to making small incremental changes and working on that together with her medical team. Says she saw someone in [place] about her joints and was told that there is no cartilage, that it's a result of the chemotherapy she was on in the past.    Today's history 12/08/19 (Ressie Slevin/Swift): Discussed her cancer history, a bad relationship that kept her from being able to participate in clinic visits and attend to her scheduled cancer care, and the fact that she is currently splitting time between both parents' homes and independently managing her medications and cancer care coordination. She shared that she had been self medicating with Xanax bars that she bought off the street and used in squares that she thinks are equivalent to 0.5mg  doses; that at times she would take more than a single dose to numb the pain even though she knew it wasn't actually affecting her pain. Symptoms as above.    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; had moved to night shifts due to issues with pain control limiting activity, picking up shifts again  Hobbies: detailing cars at the family shop  Current residence / distance from [place]: [place], [place]    Advance Care Planning:   HCPOA: not established  Natural surrogate decision maker: parents  Living Will: N/A  ACP note: none    Objective     Opioid Risk Tool:    Female  Female    Family history of substance abuse      Alcohol  1  3    Illegal drugs  2  3    Rx drugs  4  4    Personal history of substance abuse      Alcohol  3  3    Illegal drugs  4  4    Rx drugs  5  5    Age between 16--45 years  1  1    History of preadolescent sexual abuse  3  0    Psychological disease      ADD, OCD, bipolar, schizophrenia  2  2    Depression  1  1       Total: 8+ high risk  (<3 low risk, 4-7 moderate risk, >8 high risk)    Oncology History Overview Note   Left breast cT2 N0 IDC, G3, weakly ER+, PR-, HER-  Malignant neoplasm of upper-outer quadrant of left breast in female, estrogen receptor negative (CMS-HCC)   12/03/2017 -  Presenting Symptoms    Presented with self detected left breast mass x 3 weeks.  MMG/US: Left breast irregular 2.7 x 3.2 cm mass. There are faint microcalcifications associated with the mass and mild surrounding trabecular thickening is suggestive of edema.  There are 2 hyperdense and enlarged left axillary lymph nodes. No other suspicious findings are seen in the left breast.     12/03/2017 -  Other    Clinic exam: Left breast in the 1 to 2 o'clock position there is a 4 cm irregular mass. Subtle pink erythema without pitting. Left axilla with a palpable 2 cm lymph node.     12/03/2017 Biopsy    Left breast USG core biopsy: IDC, G3, ER+(5%), PR-(0), HER2-(0)  Left axillary USG core biopsy: fragments of LN negative for carcinoma     12/10/2017 Initial Diagnosis    Malignant neoplasm of upper-outer quadrant of left breast in female, estrogen receptor negative (CMS-HCC)     12/10/2017 Tumor Board    MDC recs: Left breast cT2 N0 IDC, G3, +/-/-. Very suspicious LN on imaging and exam. LN core biopsy negative. Discussed considering repeat core (although needle is definitively in the node) vs outback SLN. Will receive NACT. If node neg, plan for outback SLN. Node positivity would affect radiation recs if patient has mastectomy. Meeting genetics today. Will discuss fertility. Also discussed concerns for XRT in young patient in 20's - higher risk of complications from XRT long-term. Consider staging studies.     12/10/2017 -  Cancer Staged    Staging form: Breast, AJCC 8th Edition  - Clinical stage from 12/10/2017: Stage IIB (cT2, cN0(f), cM0, G3, ER+, PR-, HER2-) - Signed by Talbert Cage, DO on 12/10/2017       12/11/2017 Biopsy    Repeat left axillary core biopsy given clinical suspicion: LN negative for carcinoma     12/24/2017 - 02/17/2018 Chemotherapy    OP BREAST AC (DOSE DENSE)  DOXOrubicin 60 mg/m2, Cyclophosphamide 600 mg/m2 every 14 days     01/08/2018 Genetics    Patient has genetic testing done for breast cancer.  Negative [facility] Breast Cancer Guidelines-Based Panel.     02/25/2018 - 05/19/2018 Chemotherapy    OP BREAST PACLItaxel WEEKLY / CARBOplatin EVERY 3 WEEKS  PACLItaxel 80 mg/m2 IV weekly   CARBOplatin AUC 6 IV every 3 weeks   21-day cycle x 4 cycles      05/30/2018 Surgery    Left SSM: Residual IDC, Gr 2 +/-/- measuring 8mm.  Final margins are clear.  0/4 SLN with metastatic disease  Residual Cancer Burden   Tumor bed size: 8 mm x 7 mm  Overall tumor cellularity: 40%  Percentage in situ: 0%  Number of involved lymph nodes: 0  Diameter of largest metastasis: 0 mm  Residual Cancer Burden Score: 1.687  Residual Cancer Burden Class: RCB-II     06/09/2018 -  Cancer Staged    Staging form: Breast, AJCC 8th Edition  - Pathologic stage from 06/09/2018: No Stage Recommended (ypT1b, pN0(sn), cM0, G2, ER+, PR-, HER2-) - Signed by Rudie Meyer, ANP on 06/09/2018       10/23/2018 Surgery    Left chest wall excision by Dr. Dellis Anes. Final Path: IDC Grade 3 ER-PR- HER2- measuring 1 cm in greatest dimension. Final Margins clear.      11/12/2018 -  Cancer Staged    Staging form:  Breast, AJCC 8th Edition  - Pathologic stage from 11/12/2018: Stage IB (rpT1b, pN0, cM0, G3, ER-, PR-, HER2-) - Signed by Talbert Cage, DO on 11/13/2018       11/10/2019 - 05/03/2020 Chemotherapy    OP BREAST GEMCITABINE (2 OF 3 WEEKS)  gemcitabine 1,000 mg/m2 IV on days 1, 8, every 21 days     08/10/2020 -  Cancer Staged    Staging form: Breast, AJCC 8th Edition  - Pathologic: Stage IV (pM1) - Signed by Donzetta Kohut, PA on 08/10/2020       09/15/2020 - 09/15/2020 Chemotherapy    OP BREAST SACITUZUMAB GOVITECAN-HZIY  sacituzumab govitecan-hziy 10 mg/kg IV on days 1, 8, every 21 days         Patient Active Problem List   Diagnosis   ??? Malignant neoplasm of upper-outer quadrant of left breast in female, estrogen receptor negative (CMS-HCC)   ??? Microcytic anemia   ??? Symptomatic anemia   ??? PTSD (post-traumatic stress disorder)   ??? 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 Anxiety disorder   ??? S/P breast reconstruction   ??? Arthralgia   ??? Nausea       Past Medical History:   Diagnosis Date   ??? Anxiety    ??? Depression    ??? Malignant neoplasm of upper-outer quadrant of left breast in female, estrogen receptor negative (CMS-HCC) 12/10/2017   ??? Panic disorder    ??? PTSD (post-traumatic stress disorder)        Past Surgical History:   Procedure Laterality Date   ??? AUGMENTATION MAMMAPLASTY Left 08/2018    left only    ??? BREAST BIOPSY Left 2019    malignant   ??? CHEMOTHERAPY Left 11/2017    May 13 2018   ??? IR INSERT PORT AGE GREATER THAN 5 YRS  12/18/2017    IR INSERT PORT AGE GREATER THAN 5 YRS 12/18/2017 [name], MD IMG VIR [facility]   ??? MASTECTOMY Left 05/2018   ??? PR BRONCHOSCOPY,BIOPSY Right 10/23/2019    Procedure: Bronchoscopy, Rigid/Flexible, Include Fluoro Guide When Performed; W/Bronch/Endobronch Bx, Single/Mult Site;  Surgeon: Mercy Moore, MD;  Location: MAIN [facility];  Service: Pulmonary   ??? PR BRONCHOSCOPY,TRANSBRON ASPIR BX Right 10/23/2019    Procedure: Bronchoscopy, Rigid/Flex, Incl Fluoro; W/Transbronch Ndl Aspirat Bx, Trachea, Main Stem &/Or Lobar Bronchus;  Surgeon: Mercy Moore, MD;  [facility] MAIN [facility];  Service: Pulmonary   ??? PR BX/REMV,LYMPH NODE,DEEP AXILL Left 05/30/2018    Procedure: BX/EXC LYMPH NODE; OPEN, DEEP AXILRY NODE;  Surgeon: Talbert Cage, DO;  Location: ASC [facility];  Service: Surgical Oncology Breast   ??? PR EXC TUMOR SOFT TISSUE NECK/ANT THORAX SUBQ 3+CM Right 10/23/2018    Procedure: Priority EXCISION, TUMOR, SOFT TISSUE OF NECK OR ANTERIOR THORAX, SUBCUTANEOUS; 3 CM OR GREATER;  Surgeon: Talbert Cage, DO;  Location: ASC OR [facility];  Service: Surgical Oncology Breast   ??? PR IMPLNT BIO IMPLNT FOR SOFT TISSUE REINFORCEMENT Left 05/30/2018    Procedure: IMPLANTATION BIOLOGIC IMPLANT(EG, ACELLULAR DERMAL MATRIX) FOR SOFT TISSUE REINFORCEMENT(EG, BREAST, TRUNK);  Surgeon: Arsenio Katz, MD;  Location[facility] ASC [facility];  Service: Plastics   ??? PR INSERTION BREAST IMPLANT SAME DAY OF MASTECTOMY Left 05/30/2018    Procedure: IMMED INSRT BREAST PROSTH AFTER MASTOPEX/MASTECT;  Surgeon: Arsenio Katz, MD;  Location: ASC OR [facility];  Service: Plastics   ??? PR INTRAOPERATIVE SENTINEL LYMPH NODE ID W DYE INJECTION Left  05/30/2018    Procedure: INTRAOPERATIVE IDENTIFICATION SENTINEL LYMPH NODE(S) INCLUDE INJECTION NON-RADIOACTIVE DYE, WHEN PERFORMED;  Surgeon: Talbert Cage, DO;  Location: ASC [facility];  Service: Surgical Oncology Breast   ??? PR MASTECTOMY, SIMPLE, COMPLETE Left 05/30/2018    Procedure: Urgent MASTECTOMY, SIMPLE, COMPLETE;  Surgeon: Talbert Cage, DO;  [facility];  Service: Surgical Oncology Breast   ??? PR RECMPL WND TRUNK 2.6-7.5 CM Left 10/23/2018    Procedure: REPR COMPLX TRUNK; 2.6 CM TO 7.5 CM;  Surgeon: Arsenio Katz, MD;  Location: ASC OR [facility]; Service: Plastics   ??? PR REPLACEMENT TISSUE EXPANDER W/PERMANENT IMPLANT Left 09/24/2018    Procedure: REPLACEMENT OF TISSUE EXPANDER WITH PERMANENT PROSTHESIS;  Surgeon: Arsenio Katz, MD;  Location: MAIN OR [facility];  Service: Plastics   ??? PR REVISION OF RECONSTRUCTED BREAST Left 09/24/2018    Procedure: R21 - REVISION OF RECONSTRUCTED  BREAST;  Surgeon: Arsenio Katz, MD;  Location: MAIN OR [facility];  Service: Plastics   ??? PR SUSPENSION OF BREAST Right 09/24/2018    Procedure: MASTOPEXY FOR SYMMETRY PURPOSES;  Surgeon: Arsenio Katz, MD;  Location: MAIN OR [facility];  Service: Plastics   ??? PR TISSUE EXPANDER PLACEMENT BREAST RECONSTRUCTION Left 05/30/2018    Procedure: BREAST RECON IMMED/DELAY W/EXPANDR W/SUBSQT EXPA;  Surgeon: Arsenio Katz, MD;  Location: ASC OR [facility];  Service: Plastics       Current Outpatient Medications   Medication Sig Dispense Refill   ??? acetaminophen (TYLENOL) 325 MG tablet Take 325 mg by mouth every six (6) hours as needed for pain.     ??? benzonatate (TESSALON PERLES) 100 MG capsule Take 1 capsule (100 mg total) by mouth Three (3) times a day as needed for cough. 60 capsule 1   ??? capecitabine (XELODA) 500 MG tablet Take 4 tablets (2,000 mg total) by mouth Two (2) times a day for 14 days . Followed by 7 days off drug. 112 tablet 0   ??? ergocalciferol-1,250 mcg, 50,000 unit, (VITAMIN D2-1,250 MCG, 50,000 UNIT,) 1,250 mcg (50,000 unit) capsule Take 1 capsule (1,250 mcg total) by mouth once a week. 4 capsule 2   ??? famotidine (PEPCID) 20 MG tablet Take 1 tablet (20 mg total) by mouth Two (2) times a day. 60 tablet 11   ??? ferrous sulfate 325 (65 FE) MG tablet Take 1 tablet (325 mg total) by mouth daily. Take with orange juice. 30 tablet 3   ??? gabapentin (NEURONTIN) 300 MG capsule Take 1 capsule (300 mg total) by mouth nightly. 30 capsule 1   ??? lidocaine-prilocaine (EMLA) 2.5-2.5 % cream Apply topically once as needed for up to 1 dose. Apply small amount to port site 30 minutes - 1 hour prior to access. 30 g 0   ??? loperamide (IMODIUM) 2 mg capsule Take 2 mg by mouth 4 (four) times a day as needed.      ??? naloxone (NARCAN) 4 mg nasal spray One spray in either nostril once for known/suspected opioid overdose. May repeat every 2-3 minutes in alternating nostril til EMS arrives 2 each 1   ??? naproxen (NAPROSYN) 500 MG tablet Take 1 tablet (500 mg total) by mouth in the morning and 1 tablet (500 mg total) in the evening. Take with meals. 60 tablet 2   ??? ondansetron (ZOFRAN-ODT) 4 MG disintegrating tablet Dissolve 1 tablet (4 mg total) on the tongue every eight (8) hours as needed for nausea for up to 30 doses. 30 tablet 1   ???  oxyCODONE (ROXICODONE) 15 MG immediate release tablet Take 1-2 tablets (15-30mg ) every 4-6 hours as needed for cancer related pain. 70 tablet 0   ??? pantoprazole (PROTONIX) 40 MG tablet Take 1 tablet (40 mg total) by mouth daily. 90 tablet 3   ??? polyethylene glycol (GLYCOLAX) 17 gram/dose powder Mix 17g (1 cap) in 4-8 ounces of extra liquid each day. 255 g 0   ??? predniSONE (DELTASONE) 5 MG tablet Take 1 tablet (5 mg total) by mouth in the morning. 30 tablet 1   ??? prochlorperazine (COMPAZINE) 10 MG tablet Take 1 tablet (10 mg total) by mouth every six (6) hours as needed (nausea). 30 tablet 2   ??? senna (SENNA) 8.6 mg tablet Take 1-2 tablets by mouth two (2) times a day as needed for constipation. 120 tablet 11   ??? sertraline (ZOLOFT) 100 MG tablet Take 2 tablets (200 mg total) by mouth daily. 60 tablet 2     No current facility-administered medications for this visit.       Allergies:   Allergies   Allergen Reactions   ??? Amoxicillin Other (See Comments)     EDEMA OF FACE   ??? Meloxicam Swelling and Rash     Swelling, itching, rash    ??? Shellfish Containing Products Swelling     Facial and throat swelling       Family History:  Cancer-related family history includes Skin cancer in her mother. There is no history of Breast cancer, Colon cancer, or Ovarian cancer.  She indicated that the status of her mother is unknown. She indicated that the status of her neg hx is unknown.      REVIEW OF SYSTEMS:  A comprehensive review of 10 systems was negative except for pertinent positives noted in HPI.      PHYSICAL EXAM:   VS not reviewed in Epic. Video visit. Weight had been uptrending.  GEN: Awake and alert, pleasant appearing female in no acute distress  PSYCH: Euthymic. Amiable.  HEENT: No facial asymmetry.  CV: No heave.  LUNGS: No increased work of breathing, No cough during visit.  SKIN: No rashes, petechiae or jaundice noted.    Lab Results   Component Value Date    CREATININE 0.55 (L) 10/07/2020     Lab Results   Component Value Date    ALKPHOS 239 (H) 10/07/2020    BILITOT 0.3 10/07/2020    BILIDIR <0.10 10/21/2018    PROT 7.1 10/07/2020    ALBUMIN 2.9 (L) 10/07/2020    ALT <7 (L) 10/07/2020    AST 11 10/07/2020              The patient reports they are currently: at home. I spent 16 minutes on the real-time audio and video with the patient on the date of service. I spent an additional 20 minutes on pre- and post-visit activities on the date of service.     The patient was physically located in [place] or a state in which I am permitted to provide care. The patient and/or parent/guardian understood that s/he may incur co-pays and cost sharing, and agreed to the telemedicine visit. The visit was reasonable and appropriate under the circumstances given the patient's presentation at the time.    The patient and/or parent/guardian has been advised of the potential risks and limitations of this mode of treatment (including, but not limited to, the absence of in-person examination) and has agreed to be treated using telemedicine. The patient's/patient's family's questions regarding telemedicine  have been answered.     If the visit was completed in an ambulatory setting, the patient and/or parent/guardian has also been advised to contact their provider???s office for worsening conditions, and seek emergency medical treatment and/or call 911 if the patient deems either necessary.        Mickie Hillier, MD PhD  [facility]

## 2020-10-13 DIAGNOSIS — Z171 Estrogen receptor negative status [ER-]: Principal | ICD-10-CM

## 2020-10-13 DIAGNOSIS — C50412 Malignant neoplasm of upper-outer quadrant of left female breast: Principal | ICD-10-CM

## 2020-10-13 DIAGNOSIS — M8940 Other hypertrophic osteoarthropathy, unspecified site: Principal | ICD-10-CM

## 2020-10-13 MED ORDER — METHYLPREDNISOLONE 4 MG TABLETS IN A DOSE PACK
0 refills | 0 days | Status: CP
Start: 2020-10-13 — End: ?

## 2020-10-13 NOTE — Unmapped (Signed)
Sent in a rx for medrol dose pack for pt to take when on trip.  Discussed with Dr. Avis Epley and previously discussed pro/cons with patient of PRN steroids.

## 2020-10-13 NOTE — Unmapped (Signed)
Pharmacist Phone Follow-Up    Cancer Team  Medical Oncology: Dr. Avis Epley  Reason for call: Oral chemotherapy management  Current treatment: Capecitabine     Assessment/Plan  Breast cancer:  Sheri [name] is a 25 y.o. female with ER (5%)/PR-/HER2- metastatic breast cancer who started capecitabine on 7/21.  She has fatigue but is otherwise tolerating the medication.  She has no other concerns with capecitabine.    Plan:  1. Continue capecitabine 2000 mg po BID x 14 days on and 7 days off  2. Start prochlorperazine 10 mg prior to evening dose of capecitabine for prevention of nausea  3. 10/24/20 Labs/[facility] and 10/28/20 Labs/Provider appointment  ________________________________________________________________________     Interval History   Sheri [name] is a 25 y.o. female with metastatic breast cancer who I am calling to follow-up on capecitabine.  She is currently on C2D7.  She notes that she continues to feel tired while on the medication.  She reports missing two doses this week.  I discussed adherence and she most likely missed these doses because she was staying with a friend and did not have her normal routine.  She reports that she is no longer having nausea.    Oral chemotherapy:  Capecitabine 2000 mg po BID x 14 days on  Start date: 09/15/20  Pharmacy: [facility]     Adherence: Missed two doses    Adverse Effects:   1. Fatigue  2. Nausea - resolved    Drug interactions: Will be starting zoledronic acid.  This is not expected to interact with capecitabine.    Medication list reviewed and updated in Epic.    Patient verbalized understanding of the above information.    Breast Oncology    Breast Oncology Metrics:         Chemotherapy Dose: Dose documented  Comments: 2 missed doses in last 7 days     Chemotherapy Schedule: Schedule documented     NCI CTCAE: Fatigue - Grade 1     Interventions: Other  Comments: Discussed adherence            I spent 10 minutes on the phone with the patient on the date of service. I spent an additional 10 minutes on pre- and post-visit activities.     The patient was physically located in [place] or a state in which I am permitted to provide care. The patient and/or parent/guardian understood that s/he may incur co-pays and cost sharing, and agreed to the telemedicine visit. The visit was reasonable and appropriate under the circumstances given the patient's presentation at the time.    The patient and/or parent/guardian has been advised of the potential risks and limitations of this mode of treatment (including, but not limited to, the absence of in-person examination) and has agreed to be treated using telemedicine. The patient's/patient's family's questions regarding telemedicine have been answered.     If the visit was completed in an ambulatory setting, the patient and/or parent/guardian has also been advised to contact their provider???s office for worsening conditions, and seek emergency medical treatment and/or call 911 if the patient deems either necessary.        Oncology History Overview Note   Left breast cT2 N0 IDC, G3, weakly ER+, PR-, HER-     Malignant neoplasm of upper-outer quadrant of left breast in female, estrogen receptor negative (CMS-HCC)   12/03/2017 -  Presenting Symptoms    Presented with self detected left breast mass x 3 weeks.  MMG/US: Left breast irregular  2.7 x 3.2 cm mass. There are faint microcalcifications associated with the mass and mild surrounding trabecular thickening is suggestive of edema.  There are 2 hyperdense and enlarged left axillary lymph nodes. No other suspicious findings are seen in the left breast.     12/03/2017 -  Other    Clinic exam: Left breast in the 1 to 2 o'clock position there is a 4 cm irregular mass. Subtle pink erythema without pitting. Left axilla with a palpable 2 cm lymph node.     12/03/2017 Biopsy    Left breast USG core biopsy: IDC, G3, ER+(5%), PR-(0), HER2-(0)  Left axillary USG core biopsy: fragments of LN negative for carcinoma     12/10/2017 Initial Diagnosis    Malignant neoplasm of upper-outer quadrant of left breast in female, estrogen receptor negative (CMS-HCC)     12/10/2017 Tumor Board    MDC recs: Left breast cT2 N0 IDC, G3, +/-/-. Very suspicious LN on imaging and exam. LN core biopsy negative. Discussed considering repeat core (although needle is definitively in the node) vs outback SLN. Will receive NACT. If node neg, plan for outback SLN. Node positivity would affect radiation recs if patient has mastectomy. Meeting genetics today. Will discuss fertility. Also discussed concerns for XRT in young patient in 20's - higher risk of complications from XRT long-term. Consider staging studies.     12/10/2017 -  Cancer Staged    Staging form: Breast, AJCC 8th Edition  - Clinical stage from 12/10/2017: Stage IIB (cT2, cN0(f), cM0, G3, ER+, PR-, HER2-) - Signed by Talbert Cage, DO on 12/10/2017       12/11/2017 Biopsy    Repeat left axillary core biopsy given clinical suspicion: LN negative for carcinoma     12/24/2017 - 02/17/2018 Chemotherapy    OP BREAST AC (DOSE DENSE)  DOXOrubicin 60 mg/m2, Cyclophosphamide 600 mg/m2 every 14 days     01/08/2018 Genetics    Patient has genetic testing done for breast cancer.  Negative [employer] Breast Cancer Guidelines-Based Panel.     02/25/2018 - 05/19/2018 Chemotherapy    OP BREAST PACLItaxel WEEKLY / CARBOplatin EVERY 3 WEEKS  PACLItaxel 80 mg/m2 IV weekly   CARBOplatin AUC 6 IV every 3 weeks   21-day cycle x 4 cycles      05/30/2018 Surgery    Left SSM: Residual IDC, Gr 2 +/-/- measuring 8mm.  Final margins are clear.  0/4 SLN with metastatic disease  Residual Cancer Burden   Tumor bed size: 8 mm x 7 mm  Overall tumor cellularity: 40%  Percentage in situ: 0%  Number of involved lymph nodes: 0  Diameter of largest metastasis: 0 mm  Residual Cancer Burden Score: 1.687  Residual Cancer Burden Class: RCB-II     06/09/2018 -  Cancer Staged    Staging form: Breast, AJCC 8th Edition  - Pathologic stage from 06/09/2018: No Stage Recommended (ypT1b, pN0(sn), cM0, G2, ER+, PR-, HER2-) - Signed by Rudie Meyer, ANP on 06/09/2018       10/23/2018 Surgery    Left chest wall excision by Dr. Dellis Anes. Final Path: IDC Grade 3 ER-PR- HER2- measuring 1 cm in greatest dimension. Final Margins clear.      11/12/2018 -  Cancer Staged    Staging form: Breast, AJCC 8th Edition  - Pathologic stage from 11/12/2018: Stage IB (rpT1b, pN0, cM0, G3, ER-, PR-, HER2-) - Signed by Talbert Cage, DO on 11/13/2018       11/10/2019 - 05/03/2020 Chemotherapy  OP BREAST GEMCITABINE (2 OF 3 WEEKS)  gemcitabine 1,000 mg/m2 IV on days 1, 8, every 21 days     08/10/2020 -  Cancer Staged    Staging form: Breast, AJCC 8th Edition  - Pathologic: Stage IV (pM1) - Signed by Donzetta Kohut, PA on 08/10/2020       09/15/2020 - 09/15/2020 Chemotherapy    OP BREAST SACITUZUMAB GOVITECAN-HZIY  sacituzumab govitecan-hziy 10 mg/kg IV on days 1, 8, every 21 days          Pertinent Labs:   No visits with results within 1 Day(s) from this visit.   Latest known visit with results is:   Office Visit on 10/07/2020   Component Date Value Ref Range Status   ??? Uric Acid 10/07/2020 5.3  3.1 - 7.8 mg/dL Final       Current medications:     Current Outpatient Medications   Medication Sig Dispense Refill   ??? acetaminophen (TYLENOL) 325 MG tablet Take 325 mg by mouth every six (6) hours as needed for pain.     ??? benzonatate (TESSALON PERLES) 100 MG capsule Take 1 capsule (100 mg total) by mouth Three (3) times a day as needed for cough. 60 capsule 1   ??? capecitabine (XELODA) 500 MG tablet Take 4 tablets (2,000 mg total) by mouth Two (2) times a day for 14 days . Followed by 7 days off drug. 112 tablet 0   ??? ergocalciferol-1,250 mcg, 50,000 unit, (VITAMIN D2-1,250 MCG, 50,000 UNIT,) 1,250 mcg (50,000 unit) capsule Take 1 capsule (1,250 mcg total) by mouth once a week. 4 capsule 2   ??? famotidine (PEPCID) 20 MG tablet Take 1 tablet (20 mg total) by mouth Two (2) times a day. 60 tablet 11   ??? ferrous sulfate 325 (65 FE) MG tablet Take 1 tablet (325 mg total) by mouth daily. Take with orange juice. 30 tablet 3   ??? gabapentin (NEURONTIN) 300 MG capsule Take 1 capsule (300 mg total) by mouth nightly. 30 capsule 1   ??? lidocaine-prilocaine (EMLA) 2.5-2.5 % cream Apply topically once as needed for up to 1 dose. Apply small amount to port site 30 minutes - 1 hour prior to access. 30 g 0   ??? loperamide (IMODIUM) 2 mg capsule Take 2 mg by mouth 4 (four) times a day as needed.      ??? methylPREDNISolone (MEDROL DOSEPACK) 4 mg tablet follow package directions 1 each 0   ??? MORPhine (MS CONTIN) 60 MG 12 hr tablet Take 1 tablet (60 mg total) by mouth every twelve (12) hours for 7 days. 14 tablet 0   ??? naloxone (NARCAN) 4 mg nasal spray One spray in either nostril once for known/suspected opioid overdose. May repeat every 2-3 minutes in alternating nostril til EMS arrives 2 each 1   ??? naproxen (NAPROSYN) 500 MG tablet Take 1 tablet (500 mg total) by mouth in the morning and 1 tablet (500 mg total) in the evening. Take with meals. 60 tablet 2   ??? ondansetron (ZOFRAN-ODT) 4 MG disintegrating tablet Dissolve 1 tablet (4 mg total) on the tongue every eight (8) hours as needed for nausea for up to 30 doses. 30 tablet 1   ??? oxyCODONE (ROXICODONE) 15 MG immediate release tablet Take 1-2 tablets (15-30mg ) every 4-6 hours as needed for cancer related pain. 42 tablet 0   ??? pantoprazole (PROTONIX) 40 MG tablet Take 1 tablet (40 mg total) by mouth daily. 90 tablet 3   ???  polyethylene glycol (GLYCOLAX) 17 gram/dose powder Mix 17g (1 cap) in 4-8 ounces of extra liquid each day. 255 g 0   ??? prochlorperazine (COMPAZINE) 10 MG tablet Take 1 tablet (10 mg total) by mouth every six (6) hours as needed (nausea). 30 tablet 2   ??? senna (SENNA) 8.6 mg tablet Take 1-2 tablets by mouth two (2) times a day as needed for constipation. 120 tablet 11   ??? sertraline (ZOLOFT) 100 MG tablet Take 2 tablets (200 mg total) by mouth daily. 60 tablet 2     No current facility-administered medications for this visit.

## 2020-10-13 NOTE — Unmapped (Signed)
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  Therapy plan entered.    Case series: https://doi-org.libproxy.lib.[place].edu/10.1016/j.semarthrit.2011.01.007.

## 2020-10-13 NOTE — Unmapped (Signed)
10/13/20 confirmed appointments on 8/26 with [name] @ 9:47am.

## 2020-10-17 ENCOUNTER — Telehealth: Admit: 2020-10-17 | Discharge: 2020-10-18 | Payer: MEDICAID

## 2020-10-17 MED ORDER — PROCHLORPERAZINE MALEATE 10 MG TABLET
ORAL_TABLET | Freq: Four times a day (QID) | ORAL | 2 refills | 8.00000 days | Status: CP | PRN
Start: 2020-10-17 — End: ?

## 2020-10-17 MED ORDER — OXYCODONE 15 MG TABLET
ORAL_TABLET | ORAL | 0 refills | 0.00000 days | Status: CP
Start: 2020-10-17 — End: 2020-10-17

## 2020-10-17 NOTE — Unmapped (Signed)
OUTPATIENT ONCOLOGY PALLIATIVE CARE    Principal Diagnosis: Sheri [name] is a 25 y.o. female with metastatic L breast cancer, diagnosed in October 2019. Disease sites include breast and both lungs.     Assessment/Plan:   1. 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 Pain control regimen somewhat less effective off steroids. We initiated opioid medications on 09/13/20 and discussed the importance of using only what is prescribed, keeping track of doses used (number per day and specific dose needed), not allowing others access to the medications, not using any other opioid or non-opioid medications prescribed to others, communicating about changes in pain needs other other substances being used outside of what has been prescribed.  -- Continue Tylenol 1,000mg  q8h  -- Continue Naproxen  -- STOP Sheri-Contin due to poor duration efficacy  -- Resume oxycodone as solo opioid tx, continue 15-30mg  q4-6h PRN pain, Rx #80 to cover next 8 days, 10/17/20; PDMP reviewed and consistent with only this prescriber  -- Continue to keep app of doses/times/pain characteristics  -- No longer taking gabapentin because was not effective  -- Now off daily 5mg  prednisone, may benefit from resumption or     2. Opioid-induced constipation, improved due to side effects of chemotherapy:  -- Continue Miralax and Senna as needed  -- Discussed titrating to one soft BM daily while on opioid medications    3. Cough: stable; likely related to lung metastases, responds to Tessalon, also exacerbated by reflux  -- Tessalon 100mg  BID-TID PRN, effective at this dose  -- Famotidine 20mg  BID PRN, using after symptoms arise/with other meds, may improve if remains off NSAIDs  -- May respond to oxycodone as above  -- Continue to monitor for changes    4. Depression and anxiety: had been self-medicating with Xanax, not currently using as dealers were arrested and jailed, has tried Ativan (effective) and Klonopin (ineffective, seemed to heighten anxiety/panic episodes)  -- Continue sertraline 200mg  daily  -- No new benzodiazapine medications for now, will monitor anxiety and consider new depression/anxiety regimen at later date as discussed  -- Consider olanzapine at future date, possibly other adjuncts to help with lack of motivation    5. Nausea: occurs with chemotherapy and early days of opioid use, improved with most recent modifications  -- Continue Compazine PRN, sent new Rx 10/17/20  -- Avoid Zofran as was contributing to headaches    6. Mobility issues: getting out of bed and getting dressed has been burdensome, awaiting PT/OT sessions    7. Urge incontinence: resolved.    8. GOC: did not discuss in detail today; looking forward to saving up for her own rental home.     # Controlled substances risk management.   - Patient has a signed pain medication agreement with Outpatient Palliative Care, completed on 09/13/20, as per standard of care. Pain contract discussed in detail, including her high risk status and our inability to prescribe if any future red flags. She uses a paisley medicine bag for Zoloft, Tylenol, heartburn medication etc. and will use this bag for opioids and keep it in her presence at all times.   - [place] database was reviewed today and it was appropriate. No benzodiazapine prescriptions since 2020   -  Urine drug screen was not performed at this [name]. Findings: not applicable.   - Patient has received information about safe storage and administration of medications.   - Patient has received a prescription for narcan; has been educated on its use. Patient's caregiver/friend Sam was not present, but she was encouraged to discuss with her.      F/u: WEEKLY    ----------------------------------------  Referring Provider: Dr Avis Epley  Oncology Team:   Medical Oncologist: Dr. Marc Morgans / Louann Liv  Surgical Oncologist: Dr. [name]   Radiation Oncologist: Dr. Zacarias Pontes   PCP: No PCP Per Patient      HPI: Sheri [name] is a 25 y.o. woman with left breast cancer metastatic to lungs. She was initially diagnosed with local disease in October 2019 and completed chemotherapy in March 2020. Was found to have residual disease at time of mastectomy in April 2020 and again at time of reconstruction, and additional surgical resection was required. Restaging scans in August 2020 did not reveal metastatic disease, and she completed adjuvant radiation in December 2020. She was scheduled to continue capecitabine after radiation but due to a complicated psychosocial situation this did not occur. She was found to have metastatic disease in August 2021 when she presented with hemoptysis and cough and was found to have bilateral lung involvement.     Current cancer-directed therapy: Xeloda    Interval events (Sheri [name] video [name]) 10/17/20:  -- Conducted this [name] one day early due to her plans to take a trip to the beach with her dad, stepmom, and their kids including two teenage half-brothers and one 43-year-old half brother  -- Had been in touch mid-week about Sheri-contin not lasting very long, and this MD suggested she increase frequency to TID  -- Even on a TID dose she was still using oxycodone somewhat regularly  -- Pain relatively well controlled on both medications, but Sheri-contin was not lasting more than 4-6 hours, not much difference from the oxycodone  -- Some nausea related to chemotherapy, has been using Compazine which is helpful, requests a refill today  -- No issues with constipation, mood has been good, urinary symptoms remain resolved    Interval events (Sheri [name] video [name]) 10/11/20:  -- Pain still ranges from 7-7.5 and down to 2-3 after oxycodone kicks in  -- Continues to take only 1 tab if planning to do any driving  -- BMs every 2-3 days, Miralax is at her mom's house where she plans to be later today, will resume use  -- No more urinary symptoms  -- Saw Rheum, received a diagnosis of hypertrophic osteoarthropathy, unsure when she will receive treatment, glad she has a diagnosis    Interval events (Sheri [name], video [name]), 10/04/20:  -- Pain remains well controlled on oxycodone 30mg  q4-6h when used consistently  -- Slightly more pain off steroids  -- Will wake up stiff and in pain when no recent overnight dose of oxycodone  -- Has been sleeping more regular hours past few nights, feels good  -- No constipation as diarrhea is a side effect of current chemotherapy regimen  -- Has been having a few days of urinary urgency and incontinence, wondering if related to opioids or something else; says she will wake up stiff and achy and unable to make it to bathroom before urinating spontaneously.    Interval history (Sheri [name], video [name]), 09/20/20:  -- Pain very well controlled with oxycodone, has been taking notes because couldn't find an app; has used it less  than prescribed  -- Will sometimes take 1 tab to avoid being too sleepy (if she was going to be driving our out and about)  -- Slept a lot after the first few days, now much more manageable  -- Pain described as pulsating and a 6 or 7/10 when she decides to take medication, will notice it first in ankles and wrists  -- Rates her pain as 0-2/10 about an hour after taking 2 tabs of oxycodone, will go down to 3.5 or 4/10 with one tablet  Per pain journal:       -- Tuesday 7/19: 2 tabs at 9pm       -- Wednesday 7/20: 1 tab at 1am, 1 tab at 11am, 1 tab at 2pm, 2 tabs at 6pm       -- Thursday 7/21:  2 tabs at MN, 2 tabs at 6am, 2 tabs at 2pm       -- Friday 7/22: 1 tab at 1am, 1 tab at 2am, 1 tab at 5am, 2 tabs at 11am, 2 tabs at 4pm, 2 tabs at 9pm       -- Saturday 7/23: 2 tabs at 3am, 2 tabs at 11am, 2 tabs at 6pm       -- Sunday 7/24: 2 tabs at 2am, 2 tabs at 6am, 2 tabs at 12pm, 2 tabs at 4pm, 1 tab at 9pm       -- Monday 7/25: 2 tabs at 3am, 2 tabs at 9am, 2 tabs at 1pm, 2 tabs at 5pm       -- Tuesday 7/26: 2 tabs at 1am, 2 tabs at 7am, 2 at 2pm  -- She has 8 tablets remaining, thinks she may have missed writing down one dose of one pill at some point  -- Some eyelid swelling for past 4-5 days, had started chemotherapy a few days prior to that  -- Has also been somewhat nauseated, wasn't sure if it was from chemo or oxycodone, compazine has been helpful  -- Keeps a funny sleep schedule (goes to bed around 0500 and wakes up early afternoon, will get up earlier for meds and then go back to sleep) and sleeps ok, falls asleep easily  -- Notes slightly more swelling but believes it to be a side effect of chemotherapy  -- Mobility much better and not waking up with as much stiffness/pain in her joints  -- Anxiety had been heightened in the first few days after starting oxycodone, used Ativan because worried about having a panic attack, has been better since then.    Interval events (Sheri [name]), 09/13/20:  -- Pain described as all over my body and dull/throbbing, 8+/10 at its worst, will lie in bed crying when unable to capture it with current regimen  -- Will schedule meals around med schedule, because her medications are more effective on an empty stomach  -- Has tried percocet and morphine (most recently on Saturday), will take the pain down to 2/10 but only when taking 30mg  oxycodone dose  -- No more headaches, possibly from Zofran and/or prior chemo  -- Anxiety has been fluctuating, worse last week after scans/results, taking Ativan from prior prescription, has approximately 6 tablets left, thinks they are 1mg  dose  -- Still on a stable dose of Zoloft 200mg  which helps with baseline depression/anxiety, but her pain has been exacerbating her depression  -- Using a sleep aid (diphenhydramine), did not know it was a Benadryl equivalent  -- Been living with her mom for past several  weeks brother there too but not much interaction, will stay with friend Sam for 1-2 weeks at a time and remain on good terms  -- Mom has alcoholism and paranoia, tends to fixate on certain topics, worse when it's focused on Sheri Kitko herself, currently not an active issue for past few weeks  -- Talked about therapy as not having a lasting benefit but open to setting up a more stable counseling plan with Santina Evans if it can build in some flexibility based on her pain and mobility issues       Symptom Review (January 2022):  General: doing poorly overall, has had some debilitating pain in preceding weeks, unclear etiology  Pain: full-body pain has been worse in last few weeks, will improve in week or so after chemo but then become worse, described as pain in wrists and ankles, also mid/upper back  Headaches: resolved, were present with last 3 cycles, frontal in nature and very intense, would occur 48-72 hours after tx but most recently occurred immediately following treatment. No response to Naproxen/Tylenol, did seem to respond to acetaminophen/oxycodone.  Fatigue: worse  Mobility: worse, has had discomfort with movement  Sleep: improved quality, will sleep all day after chemo  Appetite: fluctuates with pain  Nausea: present with and after, will take antiemetic meds for 1-2 days post chemo  Bowel function: regular, no current issues with constipation/hemorrhoids/bleeding  Dyspnea: not an issue, does have a fluctuating cough  Secretions: not an issue  Mood: somewhat stable on sertraline, has been depressed due to pain  Edema: fluctuates but especially bad at this time, off steroids    Interval history 05/03/20 (Sheri [name] with Sheri [name]): Had seen occupational therapy for consultative [name], plan to attend first official appointment tomorrow, will have 1x/week for 7 weeks. Will also have physical therapy and lymphedema therapy as well. She and her friend Sam describe a lack of motivation for anything, including coming to the hospital for chemotherapy despite knowing it will be helpful. Pain was debilitating for some time but was able to take their scheduled trip, now doing somewhat better and looking forward to chemotherapy.    Interval history 04/12/2020 (Sheri [name] with Sheri [name]): Had and recovered from COVID but has a persistent cough (tends to happen with all colds since childhood, mother has always been a heavy smoker which she believes contributed); was under the impression that she was not allowed back in cancer hospital for 21 days after infection, which has led to a delay in chemo admin and worsening symptoms. She says that her pain and swelling improve with chemo and is looking forward to today's treatment for those reasons. Nausea has been under better control with tweaks to the infusion regimen and PRN Compazine. Pain meds (Tylenol and Naproxen) have been only somewhat helpful for her joint pain and swollen extremities, and she is worried about an upcoming trip and whether the pain will interfere with her ability to enjoy her travels. She has also been having mobility challenges related to swelling and joint pain. Her friend Sam wonders whether physical therapy might be helpful for providing ideas for stretches/exercises they can do together at home. They are also working on setting up a regular routine to improve daytime energy.    Interval history 03/08/20 (Sheri [name]):   -- Feeling much better overall, with significant improvement in full-body pain now that she's been on a stable dose of Naproxen and Tylenol twice a day.   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   -- Began having headaches after chemotherapy, initially delayed and most recently when a nausea medication change was made, she got a headache immediately after treatment. It improved with 1/2 Percocet though she expressed a desire to avoid opioids. Now that it's been 3 days the headache has resolved, but she would love to have a way to prevent it with future chemo sessions. Says she tries to drink extra water around chemo sessions.  -- Nausea is somewhat stable and occurs with chemo and for 2-3 days afterward. Zofran has not been very helpful so she is mostly using Compazine.   -- Mental health much improved at this time; she's staying with her mother because relationship with dad has been strained since the holidays. Friend Sam also staying with her, and they plan to stay with Sam's parents if/when they need to vacate her mom's house    Interval history 01/12/20 (Sheri [name]): Doing somewhat better overall, gabapentin trial was slightly (8-10%) helpful with arm/leg pain and possibly with sleep as well. Encouraging herself to get up and busy during the day, says she's glad she's been pushing herself. Went bowling with friends one night and had a good time. Says she won't overdo it when she knows she can't handle it. Still using non-prescription Xanax approximately once a day, believes it's 0.5mg  per dose. Hoping to come off it at some point and open to making small incremental changes and working on that together with her medical team. Says she saw someone in [place] about her joints and was told that there is no cartilage, that it's a result of the chemotherapy she was on in the past.    Today's history 12/08/19 (Sheri [name]/Swift): Discussed her cancer history, a bad relationship that kept her from being able to participate in clinic visits and attend to her scheduled cancer care, and the fact that she is currently splitting time between both parents' homes and independently managing her medications and cancer care coordination. She shared that she had been self medicating with Xanax bars that she bought off the street and used in squares that she thinks are equivalent to 0.5mg  doses; that at times she would take more than a single dose to numb the pain even though she knew it wasn't actually affecting her pain. Symptoms as above.    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; had moved to night shifts due to issues with pain control limiting activity, picking up shifts again  Hobbies: detailing cars at the family shop  Current residence / distance from [name]: [place], [place]    Advance Care Planning:   HCPOA: not established  Natural surrogate decision maker: parents  Living Will: N/A  ACP note: none    Objective     Opioid Risk Tool:    Female  Female    Family history of substance abuse      Alcohol  1  3    Illegal  drugs  2  3    Rx drugs  4  4    Personal history of substance abuse      Alcohol  3  3    Illegal drugs  4  4    Rx drugs  5  5    Age between 16--45 years  1  1    History of preadolescent sexual abuse  3  0    Psychological disease      ADD, OCD, bipolar, schizophrenia  2  2    Depression  1  1       Total: 8+ high risk  (<3 low risk, 4-7 moderate risk, >8 high risk)    Oncology History Overview Note   Left breast cT2 N0 IDC, G3, weakly ER+, PR-, HER-     Malignant neoplasm of upper-outer quadrant of left breast in female, estrogen receptor negative (CMS-HCC)   12/03/2017 -  Presenting Symptoms    Presented with self detected left breast mass x 3 weeks.  MMG/US: Left breast irregular 2.7 x 3.2 cm mass. There are faint microcalcifications associated with the mass and mild surrounding trabecular thickening is suggestive of edema.  There are 2 hyperdense and enlarged left axillary lymph nodes. No other suspicious findings are seen in the left breast.     12/03/2017 -  Other    Clinic exam: Left breast in the 1 to 2 o'clock position there is a 4 cm irregular mass. Subtle pink erythema without pitting. Left axilla with a palpable 2 cm lymph node.     12/03/2017 Biopsy    Left breast USG core biopsy: IDC, G3, ER+(5%), PR-(0), HER2-(0)  Left axillary USG core biopsy: fragments of LN negative for carcinoma     12/10/2017 Initial Diagnosis    Malignant neoplasm of upper-outer quadrant of left breast in female, estrogen receptor negative (CMS-HCC)     12/10/2017 Tumor Board    MDC recs: Left breast cT2 N0 IDC, G3, +/-/-. Very suspicious LN on imaging and exam. LN core biopsy negative. Discussed considering repeat core (although needle is definitively in the node) vs outback SLN. Will receive NACT. If node neg, plan for outback SLN. Node positivity would affect radiation recs if patient has mastectomy. Meeting genetics today. Will discuss fertility. Also discussed concerns for XRT in young patient in 20's - higher risk of complications from XRT long-term. Consider staging studies.     12/10/2017 -  Cancer Staged    Staging form: Breast, AJCC 8th Edition  - Clinical stage from 12/10/2017: Stage IIB (cT2, cN0(f), cM0, G3, ER+, PR-, HER2-) - Signed by Talbert Cage, DO on 12/10/2017       12/11/2017 Biopsy    Repeat left axillary core biopsy given clinical suspicion: LN negative for carcinoma     12/24/2017 - 02/17/2018 Chemotherapy    OP BREAST AC (DOSE DENSE)  DOXOrubicin 60 mg/m2, Cyclophosphamide 600 mg/m2 every 14 days     01/08/2018 Genetics    Patient has genetic testing done for breast cancer.  Negative [facility] Breast Cancer Guidelines-Based Panel.     02/25/2018 - 05/19/2018 Chemotherapy    OP BREAST PACLItaxel WEEKLY / CARBOplatin EVERY 3 WEEKS  PACLItaxel 80 mg/m2 IV weekly   CARBOplatin AUC 6 IV every 3 weeks   21-day cycle x 4 cycles      05/30/2018 Surgery    Left SSM: Residual IDC, Gr 2 +/-/- measuring 8mm.  Final margins are clear.  0/4 SLN with metastatic disease  Residual Cancer Burden   Tumor bed size: 8 mm x 7 mm  Overall tumor cellularity: 40%  Percentage in situ: 0%  Number of involved lymph nodes: 0  Diameter of largest metastasis: 0 mm  Residual Cancer Burden Score: 1.687  Residual Cancer Burden Class: RCB-II     06/09/2018 -  Cancer Staged    Staging form: Breast, AJCC 8th Edition  - Pathologic stage from 06/09/2018: No Stage Recommended (ypT1b, pN0(sn), cM0, G2, ER+, PR-, HER2-) - Signed by Rudie Meyer, ANP on 06/09/2018       10/23/2018 Surgery    Left chest wall excision by Dr. Dellis Anes. Final Path: IDC Grade 3 ER-PR- HER2- measuring 1 cm in greatest dimension. Final Margins clear.      11/12/2018 -  Cancer Staged    Staging form: Breast, AJCC 8th Edition  - Pathologic stage from 11/12/2018: Stage IB (rpT1b, pN0, cM0, G3, ER-, PR-, HER2-) - Signed by Talbert Cage, DO on 11/13/2018       11/10/2019 - 05/03/2020 Chemotherapy    OP BREAST GEMCITABINE (2 OF 3 WEEKS)  gemcitabine 1,000 mg/m2 IV on days 1, 8, every 21 days     08/10/2020 -  Cancer Staged    Staging form: Breast, AJCC 8th Edition  - Pathologic: Stage IV (pM1) - Signed by Donzetta Kohut, PA on 08/10/2020       09/15/2020 - 09/15/2020 Chemotherapy    OP BREAST SACITUZUMAB GOVITECAN-HZIY  sacituzumab govitecan-hziy 10 mg/kg IV on days 1, 8, every 21 days         Patient Active Problem List   Diagnosis   ??? Malignant neoplasm of upper-outer quadrant of left breast in female, estrogen receptor negative (CMS-HCC)   ??? Microcytic anemia   ??? Symptomatic anemia   ??? PTSD (post-traumatic stress disorder)   ??? 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 Anxiety disorder   ??? S/P breast reconstruction   ??? Arthralgia   ??? Nausea   ??? Hypertrophic osteoarthropathy       Past Medical History:   Diagnosis Date   ??? Anxiety    ??? Depression    ??? Malignant neoplasm of upper-outer quadrant of left breast in female, estrogen receptor negative (CMS-HCC) 12/10/2017   ??? Panic disorder    ??? PTSD (post-traumatic stress disorder)        Past Surgical History:   Procedure Laterality Date   ??? AUGMENTATION MAMMAPLASTY Left 08/2018    left only    ??? BREAST BIOPSY Left 2019    malignant   ??? CHEMOTHERAPY Left 11/2017    May 13 2018   ??? IR INSERT PORT AGE GREATER THAN 5 YRS  12/18/2017    IR INSERT PORT AGE GREATER THAN 5 YRS 12/18/2017 [name], MD IMG [facility]   ??? MASTECTOMY Left 05/2018   ??? PR BRONCHOSCOPY,BIOPSY Right 10/23/2019    Procedure: Bronchoscopy, Rigid/Flexible, Include Fluoro Guide When Performed; W/Bronch/Endobronch Bx, Single/Mult Site;  Surgeon: Mercy Moore, MD;  Location: MAIN OR [facility];  Service: Pulmonary   ??? PR BRONCHOSCOPY,TRANSBRON ASPIR BX Right 10/23/2019    Procedure: Bronchoscopy, Rigid/Flex, Incl Fluoro; W/Transbronch Ndl Aspirat Bx, Trachea, Main Stem &/Or Lobar Bronchus;  Surgeon: Mercy Moore, MD;  [facility] MAIN [facility];  Service: Pulmonary   ??? PR BX/REMV,LYMPH NODE,DEEP AXILL Left 05/30/2018    Procedure: BX/EXC LYMPH NODE; OPEN, DEEP AXILRY NODE;  Surgeon: Talbert Cage, DO;  Location: ASC OR [facility];  Service: Surgical Oncology Breast   ??? PR EXC TUMOR SOFT TISSUE NECK/ANT THORAX SUBQ 3+CM Right 10/23/2018    Procedure: Priority EXCISION, TUMOR, SOFT TISSUE OF NECK OR ANTERIOR THORAX, SUBCUTANEOUS; 3 CM OR GREATER;  Surgeon: Talbert Cage, DO;  Location: ASC OR [facility];  Service: Surgical Oncology Breast   ??? PR IMPLNT BIO IMPLNT FOR SOFT TISSUE REINFORCEMENT Left 05/30/2018    Procedure: IMPLANTATION BIOLOGIC IMPLANT(EG, ACELLULAR DERMAL MATRIX) FOR SOFT TISSUE REINFORCEMENT(EG, BREAST, TRUNK);  Surgeon: Arsenio Katz, MD;  Location: ASC OR [facility];  Service: Plastics   ??? PR INSERTION BREAST IMPLANT SAME DAY OF MASTECTOMY Left 05/30/2018    Procedure: IMMED INSRT BREAST PROSTH AFTER MASTOPEX/MASTECT;  Surgeon: Arsenio Katz, MD;  Location: ASC OR [facility];  Service: Plastics   ??? PR INTRAOPERATIVE SENTINEL LYMPH NODE ID W DYE INJECTION Left 05/30/2018    Procedure: INTRAOPERATIVE IDENTIFICATION SENTINEL LYMPH NODE(S) INCLUDE INJECTION NON-RADIOACTIVE DYE, WHEN PERFORMED;  Surgeon: Talbert Cage, DO;  Location: ASC OR [facility];  Service: Surgical Oncology Breast   ??? PR MASTECTOMY, SIMPLE, COMPLETE Left 05/30/2018    Procedure: Urgent MASTECTOMY, SIMPLE, COMPLETE;  Surgeon: Talbert Cage, DO;  Location: ASC OR [facility];  Service: Surgical Oncology Breast   ??? PR RECMPL WND TRUNK 2.6-7.5 CM Left 10/23/2018    Procedure: REPR COMPLX TRUNK; 2.6 CM TO 7.5 CM;  Surgeon: Arsenio Katz, MD;  Location: ASC OR [facility];  Service: Plastics   ??? PR REPLACEMENT TISSUE EXPANDER W/PERMANENT IMPLANT Left 09/24/2018    Procedure: REPLACEMENT OF TISSUE EXPANDER WITH PERMANENT PROSTHESIS;  Surgeon: Arsenio Katz, MD;  Location: MAIN OR [facility];  Service: Plastics   ??? PR REVISION OF RECONSTRUCTED BREAST Left 09/24/2018    Procedure: R21 - REVISION OF RECONSTRUCTED  BREAST;  Surgeon: Arsenio Katz, MD;  Location: MAIN OR [facility];  Service: Plastics   ??? PR SUSPENSION OF BREAST Right 09/24/2018    Procedure: MASTOPEXY FOR SYMMETRY PURPOSES;  Surgeon: Arsenio Katz, MD;  Location: MAIN OR [facility];  Service: Plastics   ??? PR TISSUE EXPANDER PLACEMENT BREAST RECONSTRUCTION Left 05/30/2018    Procedure: BREAST RECON IMMED/DELAY W/EXPANDR W/SUBSQT EXPA;  Surgeon: Arsenio Katz, MD;  Location: ASC OR [facility];  Service: Plastics       Current Outpatient Medications   Medication Sig Dispense Refill   ??? acetaminophen (TYLENOL) 325 MG tablet Take 325 mg by mouth every six (6) hours as needed for pain.     ??? benzonatate (TESSALON PERLES) 100 MG capsule Take 1 capsule (100 mg total) by mouth Three (3) times a day as needed for cough. 60 capsule 1   ??? capecitabine (XELODA) 500 MG tablet Take 4 tablets (2,000 mg total) by mouth Two (2) times a day for 14 days . Followed by 7 days off drug. 112 tablet 0   ??? ergocalciferol-1,250 mcg, 50,000 unit, (VITAMIN D2-1,250 MCG, 50,000 UNIT,) 1,250 mcg (50,000 unit) capsule Take 1 capsule (1,250 mcg total) by mouth once a week. 4 capsule 2   ??? famotidine (PEPCID) 20 MG tablet Take 1 tablet (20 mg total) by mouth Two (2) times a day. 60 tablet 11   ??? ferrous sulfate 325 (65 FE) MG tablet Take 1 tablet (325 mg total) by mouth daily. Take with orange juice. 30 tablet 3   ??? gabapentin (NEURONTIN) 300 MG capsule Take 1 capsule (300 mg total) by mouth nightly. 30 capsule 1   ??? lidocaine-prilocaine (EMLA) 2.5-2.5 % cream Apply topically once as needed  for up to 1 dose. Apply small amount to port site 30 minutes - 1 hour prior to access. 30 g 0   ??? loperamide (IMODIUM) 2 mg capsule Take 2 mg by mouth 4 (four) times a day as needed.      ??? methylPREDNISolone (MEDROL DOSEPACK) 4 mg tablet follow package directions 1 each 0   ??? naloxone (NARCAN) 4 mg nasal spray One spray in either nostril once for known/suspected opioid overdose. May repeat every 2-3 minutes in alternating nostril til EMS arrives 2 each 1   ??? naproxen (NAPROSYN) 500 MG tablet Take 1 tablet (500 mg total) by mouth in the morning and 1 tablet (500 mg total) in the evening. Take with meals. 60 tablet 2   ??? ondansetron (ZOFRAN-ODT) 4 MG disintegrating tablet Dissolve 1 tablet (4 mg total) on the tongue every eight (8) hours as needed for nausea for up to 30 doses. 30 tablet 1   ??? oxyCODONE (ROXICODONE) 15 MG immediate release tablet Take 1-2 tablets (15-30mg ) every 4-6 hours as needed for cancer related pain. 80 tablet 0   ??? pantoprazole (PROTONIX) 40 MG tablet Take 1 tablet (40 mg total) by mouth daily. 90 tablet 3   ??? polyethylene glycol (GLYCOLAX) 17 gram/dose powder Mix 17g (1 cap) in 4-8 ounces of extra liquid each day. 255 g 0   ??? prochlorperazine (COMPAZINE) 10 MG tablet Take 1 tablet (10 mg total) by mouth every six (6) hours as needed (nausea). 30 tablet 2   ??? senna (SENNA) 8.6 mg tablet Take 1-2 tablets by mouth two (2) times a day as needed for constipation. 120 tablet 11   ??? sertraline (ZOLOFT) 100 MG tablet Take 2 tablets (200 mg total) by mouth daily. 60 tablet 2     No current facility-administered medications for this [name].       Allergies:   Allergies   Allergen Reactions   ??? Amoxicillin Other (See Comments)     EDEMA OF FACE   ??? Meloxicam Swelling and Rash     Swelling, itching, rash    ??? Shellfish Containing Products Swelling     Facial and throat swelling       Family History:  Cancer-related family history includes Skin cancer in her mother. There is no history of Breast cancer, Colon cancer, or Ovarian cancer.  She indicated that the status of her mother is unknown. She indicated that the status of her neg hx is unknown.      REVIEW OF SYSTEMS:  A comprehensive review of 10 systems was negative except for pertinent positives noted in HPI.      PHYSICAL EXAM:   VS not reviewed in Epic. Video [name]. Weight had been uptrending.  GEN: Awake and alert, pleasant appearing female in no acute distress  PSYCH: Euthymic. Amiable.  HEENT: No facial asymmetry.  CV: No heave.  LUNGS: No increased work of breathing, No cough during [name].  SKIN: No rashes, petechiae or jaundice noted. Some flushing of cheeks that she attributes to recently waking up and being under heavy blankets while asleep.    Lab Results   Component Value Date    CREATININE 0.55 (L) 10/07/2020     Lab Results   Component Value Date    ALKPHOS 239 (H) 10/07/2020    BILITOT 0.3 10/07/2020    BILIDIR <0.10 10/21/2018    PROT 7.1 10/07/2020    ALBUMIN 2.9 (L) 10/07/2020    ALT <7 (L) 10/07/2020    AST  11 10/07/2020              The patient reports they are currently: at home. I spent 12 minutes on the real-time audio and video with the patient on the date of service. I spent an additional 20 minutes on pre- and post-[name] activities on the date of service.     The patient was physically located in [place] or a state in which I am permitted to provide care. The patient and/or parent/guardian understood that s/he may incur co-pays and cost sharing, and agreed to the telemedicine [name]. The [name] was reasonable and appropriate under the circumstances given the patient's presentation at the time.    The patient and/or parent/guardian has been advised of the potential risks and limitations of this mode of treatment (including, but not limited to, the absence of in-person examination) and has agreed to be treated using telemedicine. The patient's/patient's family's questions regarding telemedicine have been answered.     If the [name] was completed in an ambulatory setting, the patient and/or parent/guardian has also been advised to contact their provider???s office for worsening conditions, and seek emergency medical treatment and/or call 911 if the patient deems either necessary.        Mickie Hillier, MD PhD  [facility]

## 2020-10-18 NOTE — Unmapped (Signed)
10/18/20 confirmed 9/23 and 10/21 with [name] @ 10:33am.

## 2020-10-20 DIAGNOSIS — C50412 Malignant neoplasm of upper-outer quadrant of left female breast: Principal | ICD-10-CM

## 2020-10-20 DIAGNOSIS — Z171 Estrogen receptor negative status [ER-]: Principal | ICD-10-CM

## 2020-10-20 MED ORDER — CAPECITABINE 500 MG TABLET
ORAL_TABLET | Freq: Two times a day (BID) | ORAL | 0 refills | 14 days | Status: CP
Start: 2020-10-20 — End: 2020-11-03
  Filled 2020-10-25: qty 112, 21d supply, fill #0

## 2020-10-24 ENCOUNTER — Institutional Professional Consult (permissible substitution): Admit: 2020-10-24 | Discharge: 2020-10-24 | Payer: MEDICAID

## 2020-10-24 ENCOUNTER — Ambulatory Visit: Admit: 2020-10-24 | Discharge: 2020-10-24 | Payer: MEDICAID

## 2020-10-24 DIAGNOSIS — Z171 Estrogen receptor negative status [ER-]: Principal | ICD-10-CM

## 2020-10-24 DIAGNOSIS — C50412 Malignant neoplasm of upper-outer quadrant of left female breast: Principal | ICD-10-CM

## 2020-10-24 DIAGNOSIS — M8940 Other hypertrophic osteoarthropathy, unspecified site: Principal | ICD-10-CM

## 2020-10-24 LAB — CALCIUM: CALCIUM: 8.7 mg/dL (ref 8.7–10.4)

## 2020-10-24 LAB — CREATININE
CREATININE: 0.46 mg/dL — ABNORMAL LOW
EGFR CKD-EPI (2021) FEMALE: >90 mL/min/{1.73_m2} (ref >=60)

## 2020-10-24 MED ADMIN — zoledronic acid (ZOMETA) 4 mg in sodium chloride (NS) 0.9 % 100 mL IVPB: 4 mg | INTRAVENOUS | @ 18:00:00 | Stop: 2020-10-24

## 2020-10-24 MED ADMIN — sodium chloride (NS) 0.9 % infusion: 20 mL/h | INTRAVENOUS | @ 18:00:00 | Stop: 2020-10-24

## 2020-10-24 MED ADMIN — heparin, porcine (PF) 100 unit/mL injection 500 Units: 500 [IU] | INTRAVENOUS | @ 17:00:00 | Stop: 2020-10-24

## 2020-10-24 NOTE — Unmapped (Signed)
Patient arrived to infusion in stable condition for zometa infusion. Port already accessed and labs collected in earlier nurse visit. Labs reviewed and found to be in parameters for treatment. Zometa released to pharmacy. Infusion administered as ordered. Patient tolerated well. No adverse reactions noted. AVS declined. Port flushed, hep locked, and deaccessed per protocol. Patient discharged home to self care, in stable condition.

## 2020-10-24 NOTE — Unmapped (Signed)
Pharmacy: [facility]    Agent: Zometa  Day 1 of treatment: 10/24/20    I counseled Christena Sunderlin about her new medication regimen. Counseling points are below. Patient's father also present for visit. Updated home med list.     Objective:      Allergies:    Allergies   Allergen Reactions   ??? Amoxicillin Other (See Comments)     EDEMA OF FACE   ??? Meloxicam Swelling and Rash     Swelling, itching, rash    ??? Shellfish Containing Products Swelling     Facial and throat swelling       Vital Signs/Weight:    BP 142/74  - Pulse 109  - Temp 36.3 ??C (97.4 ??F) (Temporal)  - Resp 18  - Wt (!) 133.8 kg (294 lb 15.6 oz)  - BMI 47.61 kg/m??      Adverse Effects:      Handout(s) provided from: [facility]    Adverse effects discussed included but were not limited to the following:      Flu-like symptoms (body aches, fever), ONJ, Nausea/Vomiting, Renal Toxicity, Constipation, Bone Pain and Electrolyte disturbances (hypocalcemia)  Fatigue  Rare, but serious side effects requiring immediate treatment and physician notification (e.g. fever, bleeding, clots).      Additional discussion:    ??? Flu-like symptoms -- Patient takes scheduled APAP & naproxen at home for cancer-related pain. Encouraged her that these should help prevent/relieve body aches & fever following infusion.   ??? 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 She inquired if this would help her bone pain. Counseled her that while she might experience bone pain in the short-term following infusions, the goal is long-term improvement in pain.   ??? ONJ (osteonecrosis of jaw) -- Patient reports she does not think she has any upcoming dental work for several months. Recommended she inform her dentist she is taking Zometa & reach out to team if need for dental work arises.   ??? Anorexia/weight loss -- Patient's weight is down 14 lbs from last visit on 8/12. Reports decreased appetite & early satiety. States she has difficulty with access to food & is rarely able to finish a meal when she does have access to one. Denies any taste changes. She is unsure if early satiety is from disease, chemo, or her body adjusting to eating less frequently.  ??? Capecitabine -- Denied any side effects from capecitabine. Went to [employer] with family last week & seems to be doing well overall.       Approximate time spent with patient: 25 mins. Patient had no further questions at this time.      Dorrene German, PharmD Candidate

## 2020-10-24 NOTE — Unmapped (Signed)
Pt's port accessed - flushed, bld return noted/sluggish, labs drawn and heplocked. Pt tolerated well.

## 2020-10-24 NOTE — Unmapped (Signed)
[facility] Refill Coordination Note    Specialty Medication(s) to be Shipped:   Hematology/Oncology: Capecitabine 500mg , directions: Take 4 tablets (2,000 mg total) by mouth Two (2) times a day for 14 days . Followed by 7 days off drug.    Other medication(s) to be shipped: No additional medications requested for fill at this time     Sheri [name], DOB: [birth year]  Phone: [phone] (home)       All above HIPAA information was verified with patient.     Was a Nurse, learning disability used for this call? No    Completed refill call assessment today to schedule patient's medication shipment from the [facility] [phone]).  All relevant notes have been reviewed.     Specialty medication(s) and dose(s) confirmed: Regimen is correct and unchanged.   Changes to medications: Taneia reports no changes at this time.  Changes to insurance: No  New side effects reported not previously addressed with a pharmacist or physician: None reported  Questions for the pharmacist: No    Confirmed patient received a Conservation officer, historic buildings and a Surveyor, mining with first shipment. The patient will receive a drug information handout for each medication shipped and additional FDA Medication Guides as required.       DISEASE/MEDICATION-SPECIFIC INFORMATION        N/A    SPECIALTY MEDICATION ADHERENCE     Medication Adherence    Patient reported X missed doses in the last month: 0  Specialty Medication: Capecitabine 500mg   Informant: patient              Were doses missed due to medication being on hold? No    Capecitabine 500 mg: 0 days of medicine on hand       REFERRAL TO PHARMACIST     Referral to the pharmacist: Not needed      [facility]     Shipping address confirmed in Epic.     Delivery Scheduled: Yes, Expected medication delivery date: 10/26/20.     Medication will be delivered via Next Day Courier to the prescription address in Epic WAM.    Jasper Loser   [facility] Specialty Technician

## 2020-10-25 ENCOUNTER — Telehealth: Admit: 2020-10-25 | Discharge: 2020-10-26 | Payer: MEDICAID

## 2020-10-25 DIAGNOSIS — Z515 Encounter for palliative care: Principal | ICD-10-CM

## 2020-10-25 DIAGNOSIS — Z171 Estrogen receptor negative status [ER-]: Principal | ICD-10-CM

## 2020-10-25 DIAGNOSIS — C50412 Malignant neoplasm of upper-outer quadrant of left female breast: Principal | ICD-10-CM

## 2020-10-25 DIAGNOSIS — G893 Neoplasm related pain (acute) (chronic): Principal | ICD-10-CM

## 2020-10-25 DIAGNOSIS — M8940 Other hypertrophic osteoarthropathy, unspecified site: Principal | ICD-10-CM

## 2020-10-25 MED ORDER — OXYCODONE 15 MG TABLET
ORAL_TABLET | 0 refills | 0 days | Status: CP
Start: 2020-10-25 — End: ?

## 2020-10-25 NOTE — Unmapped (Signed)
OUTPATIENT ONCOLOGY PALLIATIVE CARE    Principal Diagnosis: Ms. Inglis is a 25 y.o. female with metastatic L breast cancer, diagnosed in October 2019. Disease sites include breast and both lungs.     Assessment/Plan:   1. 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 Pain control regimen somewhat less effective off steroids. We initiated opioid medications on 09/13/20 and discussed the importance of using only what is prescribed, keeping track of doses used (number per day and specific dose needed), not allowing others access to the medications, not using any other opioid or non-opioid medications prescribed to others, communicating about changes in pain needs other other substances being used outside of what has been prescribed.  -- Continue Tylenol 1,000mg  q8h  -- Continue Naproxen  -- Continue oxycodone as solo opioid tx, continue 15-30mg  q4-6h PRN pain, Rx #140 and spaced to 2 weeks now that she has consistently participated in weekly visits for past 6 weeks; PDMP reviewed and consistent with only this prescriber  -- Continue to keep app of doses/times/pain characteristics  -- No longer taking gabapentin because was not effective  -- Received a steroid dose pack last week, beneficial, also started bisphosphonate tx, will monitor symptoms closely    2. Opioid-induced constipation, improved due to side effects of chemotherapy:  -- Continue Miralax and Senna as needed  -- Discussed titrating to one soft BM daily while on opioid medications    3. Cough: stable; likely related to lung metastases, responds to Tessalon, also exacerbated by reflux  -- Tessalon 100mg  BID-TID PRN, effective at this dose  -- Famotidine 20mg  BID PRN, using after symptoms arise/with other meds, may improve if remains off NSAIDs  -- May respond to oxycodone as above  -- Continue to monitor for changes    4. Depression and anxiety: had been self-medicating with Xanax, not currently using as dealers were arrested and jailed, has tried Ativan (effective) and Klonopin (ineffective, seemed to heighten anxiety/panic episodes)  -- Continue sertraline 200mg  daily  -- No new benzodiazapine medications for now, will monitor anxiety and consider new depression/anxiety regimen at later date as discussed  -- Consider olanzapine at future date, possibly other adjuncts to help with lack of motivation    5. Nausea: occurs with chemotherapy and early days of opioid use, improved with most recent modifications  -- Continue Compazine PRN, sent new Rx 10/17/20  -- Avoid Zofran as was contributing to headaches    6. Mobility issues: getting out of bed and getting dressed has been burdensome, awaiting PT/OT sessions    7. GOC: did not discuss in detail today; looking forward to saving up for her own rental home.     # Controlled substances risk management.   - Patient has a signed pain medication agreement with Outpatient Palliative Care, completed on 09/13/20, as per standard of care. Pain contract discussed in detail, including her high risk status and our inability to prescribe if any future red flags. She uses a paisley medicine bag for Zoloft, Tylenol, heartburn medication etc. and will use this bag for opioids and keep it in her presence at all times.   - [place] database was reviewed today and it was appropriate. No benzodiazapine prescriptions since  2020   - Urine drug screen was not performed at this visit. Findings: not applicable.   - Patient has received information about safe storage and administration of medications.   - Patient has received a prescription for narcan; has been educated on its use. Patient's caregiver/friend Sam was not present, but she was encouraged to discuss with her.      F/u: 2 weeks    ----------------------------------------  Referring Provider: Dr Avis Epley  Oncology Team:   Medical Oncologist: Dr. Marc Morgans / Louann Liv  Surgical Oncologist: Dr. [name]   Radiation Oncologist: Dr. Zacarias Pontes   PCP: No PCP Per Patient      HPI: Ms Hershberger is a 25 y.o. woman with left breast cancer metastatic to lungs. She was initially diagnosed with local disease in October 2019 and completed chemotherapy in March 2020. Was found to have residual disease at time of mastectomy in April 2020 and again at time of reconstruction, and additional surgical resection was required. Restaging scans in August 2020 did not reveal metastatic disease, and she completed adjuvant radiation in December 2020. She was scheduled to continue capecitabine after radiation but due to a complicated psychosocial situation this did not occur. She was found to have metastatic disease in August 2021 when she presented with hemoptysis and cough and was found to have bilateral lung involvement.     Current cancer-directed therapy: Xeloda    Today's update (Harmani Neto video visit) 10/25/20:  -- Had a good time at the beach with dad/stepmom and sibs  -- Got a lot of my strength back and pain was well controlled on steroid pack last week; says she's not usually able to open bottles easily but that hasn't been a problem anymore  -- Pain well controlled on current oxycodone regimen, continues to use 1-2 tabs every 4-6 hours, usually 2 tabs at a time unless she plans to be driving  -- Bisphosphonate infusion yesterday which she hopes will kick in and replace need for steroids?  -- Has been using the OT putty since seeing them last fall  -- Medicaid application is on hold because she can't find titles to the 3 vehicles that were bought in her name    Interval events (Aireonna Bauer video visit) 10/17/20:  -- Conducted this visit one day early due to her plans to take a trip to the beach with her dad, stepmom, and their kids including two teenage half-brothers and one 38-year-old half brother  -- Had been in touch mid-week about MS-contin not lasting very long, and this MD suggested she increase frequency to TID  -- Even on a TID dose she was still using oxycodone somewhat regularly  -- Pain relatively well controlled on both medications, but MS-contin was not lasting more than 4-6 hours, not much difference from the oxycodone  -- Some nausea related to chemotherapy, has been using Compazine which is helpful, requests a refill today  -- No issues with constipation, mood has been good, urinary symptoms remain resolved    Interval events (Nitza Schmid video visit) 10/11/20:  -- Pain still ranges from 7-7.5 and down to 2-3 after oxycodone kicks in  -- Continues to take only 1 tab if planning to do any driving  -- BMs every 2-3 days, Miralax is at her mom's house where she plans to be later today, will resume use  -- No more urinary symptoms  -- Saw Rheum, received a diagnosis of hypertrophic osteoarthropathy, unsure when she will receive treatment, glad she has a  diagnosis    Interval events (Kienna Moncada, video visit), 10/04/20:  -- Pain remains well controlled on oxycodone 30mg  q4-6h when used consistently  -- Slightly more pain off steroids  -- Will wake up stiff and in pain when no recent overnight dose of oxycodone  -- Has been sleeping more regular hours past few nights, feels good  -- No constipation as diarrhea is a side effect of current chemotherapy regimen  -- Has been having a few days of urinary urgency and incontinence, wondering if related to opioids or something else; says she will wake up stiff and achy and unable to make it to bathroom before urinating spontaneously.    Interval history (Huan Pollok, video visit), 09/20/20:  -- Pain very well controlled with oxycodone, has been taking notes because couldn't find an app; has used it less than prescribed  -- Will sometimes take 1 tab to avoid being too sleepy (if she was going to be driving our out and about)  -- Slept a lot after the first few days, now much more manageable  -- Pain described as pulsating and a 6 or 7/10 when she decides to take medication, will notice it first in ankles and wrists  -- Rates her pain as 0-2/10 about an hour after taking 2 tabs of oxycodone, will go down to 3.5 or 4/10 with one tablet  Per pain journal:       -- Tuesday 7/19: 2 tabs at 9pm       -- Wednesday 7/20: 1 tab at 1am, 1 tab at 11am, 1 tab at 2pm, 2 tabs at 6pm       -- Thursday 7/21:  2 tabs at MN, 2 tabs at 6am, 2 tabs at 2pm       -- Friday 7/22: 1 tab at 1am, 1 tab at 2am, 1 tab at 5am, 2 tabs at 11am, 2 tabs at 4pm, 2 tabs at 9pm       -- Saturday 7/23: 2 tabs at 3am, 2 tabs at 11am, 2 tabs at 6pm       -- Sunday 7/24: 2 tabs at 2am, 2 tabs at 6am, 2 tabs at 12pm, 2 tabs at 4pm, 1 tab at 9pm       -- Monday 7/25: 2 tabs at 3am, 2 tabs at 9am, 2 tabs at 1pm, 2 tabs at 5pm       -- Tuesday 7/26: 2 tabs at 1am, 2 tabs at 7am, 2 at 2pm  -- She has 8 tablets remaining, thinks she may have missed writing down one dose of one pill at some point  -- Some eyelid swelling for past 4-5 days, had started chemotherapy a few days prior to that  -- Has also been somewhat nauseated, wasn't sure if it was from chemo or oxycodone, compazine has been helpful  -- Keeps a funny sleep schedule (goes to bed around 0500 and wakes up early afternoon, will get up earlier for meds and then go back to sleep) and sleeps ok, falls asleep easily  -- Notes slightly more swelling but believes it to be a side effect of chemotherapy  -- Mobility much better and not waking up with as much stiffness/pain in her joints  -- Anxiety had been heightened in the first few days after starting oxycodone, used Ativan because worried about having a panic attack, has been better since then.    Interval events (Makenzi Bannister/Swift), 09/13/20:  -- Pain described as all over my body and dull/throbbing, 8+/10 at its  worst, will lie in bed crying when unable to capture it with current regimen  -- Will schedule meals around med schedule, because her medications are more effective on an empty stomach  -- Has tried percocet and morphine (most recently on Saturday), will take the pain down to 2/10 but only when taking 30mg  oxycodone dose  -- No more headaches, possibly from Zofran and/or prior chemo  -- Anxiety has been fluctuating, worse last week after scans/results, taking Ativan from prior prescription, has approximately 6 tablets left, thinks they are 1mg  dose  -- Still on a stable dose of Zoloft 200mg  which helps with baseline depression/anxiety, but her pain has been exacerbating her depression  -- Using a sleep aid (diphenhydramine), did not know it was a Benadryl equivalent  -- Been living with her mom for past several weeks brother there too but not much interaction, will stay with friend Sam for 1-2 weeks at a time and remain on good terms  -- Mom has alcoholism and paranoia, tends to fixate on certain topics, worse when it's focused on Ms Bhargava herself, currently not an active issue for past few weeks  -- Talked about therapy as not having a lasting benefit but open to setting up a more stable counseling plan with Santina Evans if it can build in some flexibility based on her pain and mobility issues       Symptom Review (January 2022):  General: doing poorly overall, has had some debilitating pain in preceding weeks, unclear etiology  Pain: full-body pain has been worse in last few weeks, will improve in week or so after chemo but then become worse, described as pain in wrists and ankles, also mid/upper back  Headaches: resolved, were present with last 3 cycles, frontal in nature and very intense, would occur 48-72 hours after tx but most recently occurred immediately following treatment. No response to Naproxen/Tylenol, did seem to respond to acetaminophen/oxycodone.  Fatigue: worse  Mobility: worse, has had discomfort with movement  Sleep: improved quality, will sleep all day after chemo  Appetite: fluctuates with pain  Nausea: present with and after, will take antiemetic meds for 1-2 days post chemo  Bowel function: regular, no current issues with constipation/hemorrhoids/bleeding  Dyspnea: not an issue, does have a fluctuating cough  Secretions: not an issue  Mood: somewhat stable on sertraline, has been depressed due to pain  Edema: fluctuates but especially bad at this time, off steroids    Interval history 05/03/20 (Tylah Mancillas/Swift joint visit with Beckie Busing): Had seen occupational therapy for consultative visit, plan to attend first official appointment tomorrow, will have 1x/week for 7 weeks. Will also have physical therapy and lymphedema therapy as well. She and her friend Sam describe a lack of motivation for anything, including coming to the hospital for chemotherapy despite knowing it will be helpful. Pain was debilitating for some time but was able to take their scheduled trip, now doing somewhat better and looking forward to chemotherapy.    Interval history 04/12/2020 (Korynn Kenedy/Swift joint visit with Urban): Had and recovered from COVID but has a persistent cough (tends to happen with all colds since childhood, mother has always been a heavy smoker which she believes contributed); was under the impression that she was not allowed back in cancer hospital for 21 days after infection, which has led to a delay in chemo admin and worsening symptoms. She says that her pain and swelling improve with chemo and is looking forward to today's treatment for those reasons. Nausea has been under  better control with tweaks to the infusion regimen and PRN Compazine. Pain meds (Tylenol and Naproxen) have been only somewhat helpful for her joint pain and swollen extremities, and she is worried about an upcoming trip and whether the pain will interfere with her ability to enjoy her travels. She has also been having mobility challenges related to swelling and joint pain. Her friend Sam wonders whether physical therapy might be helpful for providing ideas for stretches/exercises they can do together at home. They are also working on setting up a regular routine to improve daytime energy.    Interval history 03/08/20 (Lakshmi Sundeen/Swift):   -- Feeling much better overall, with significant improvement in full-body pain now that she's been on a stable dose of Naproxen and Tylenol twice a day.   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   -- Began having headaches after chemotherapy, initially delayed and most recently when a nausea medication change was made, she got a headache immediately after treatment. It improved with 1/2 Percocet though she expressed a desire to avoid opioids. Now that it's been 3 days the headache has resolved, but she would love to have a way to prevent it with future chemo sessions. Says she tries to drink extra water around chemo sessions.  -- Nausea is somewhat stable and occurs with chemo and for 2-3 days afterward. Zofran has not been very helpful so she is mostly using Compazine.   -- Mental health much improved at this time; she's staying with her mother because relationship with dad has been strained since the holidays. Friend Sam also staying with her, and they plan to stay with Sam's parents if/when they need to vacate her mom's house    Interval history 01/12/20 (Wirt Hemmerich/Steele/Swift): Doing somewhat better overall, gabapentin trial was slightly (8-10%) helpful with arm/leg pain and possibly with sleep as well. Encouraging herself to get up and busy during the day, says she's glad she's been pushing herself. Went bowling with friends one night and had a good time. Says she won't overdo it when she knows she can't handle it. Still using non-prescription Xanax approximately once a day, believes it's 0.5mg  per dose. Hoping to come off it at some point and open to making small incremental changes and working on that together with her medical team. Says she saw someone in [place] about her joints and was told that there is no cartilage, that it's a result of the chemotherapy she was on in the past.    Today's history 12/08/19 (Mearle Drew/Swift): Discussed her cancer history, a bad relationship that kept her from being able to participate in clinic visits and attend to her scheduled cancer care, and the fact that she is currently splitting time between both parents' homes and independently managing her medications and cancer care coordination. She shared that she had been self medicating with Xanax bars that she bought off the street and used in squares that she thinks are equivalent to 0.5mg  doses; that at times she would take more than a single dose to numb the pain even though she knew it wasn't actually affecting her pain. Symptoms as above.    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; had moved to night shifts due to issues with pain control limiting activity, picking up shifts again  Hobbies: detailing cars at the family shop  Current residence / distance from [place]: [place], [place]    Advance Care Planning:   HCPOA: not established  Natural surrogate decision maker: parents  Living Will: N/A  ACP note: none    Objective     Opioid Risk Tool:    Female  Female    Family history of substance abuse      Alcohol  1  3 Illegal drugs  2  3    Rx drugs  4  4    Personal history of substance abuse      Alcohol  3  3    Illegal drugs  4  4    Rx drugs  5  5    Age between 16--45 years  1  1    History of preadolescent sexual abuse  3  0    Psychological disease      ADD, OCD, bipolar, schizophrenia  2  2    Depression  1  1       Total: 8+ high risk  (<3 low risk, 4-7 moderate risk, >8 high risk)    Oncology History Overview Note   Left breast cT2 N0 IDC, G3, weakly ER+, PR-, HER-     Malignant neoplasm of upper-outer quadrant of left breast in female, estrogen receptor negative (CMS-HCC)   12/03/2017 -  Presenting Symptoms    Presented with self detected left breast mass x 3 weeks.  MMG/US: Left breast irregular 2.7 x 3.2 cm mass. There are faint microcalcifications associated with the mass and mild surrounding trabecular thickening is suggestive of edema.  There are 2 hyperdense and enlarged left axillary lymph nodes. No other suspicious findings are seen in the left breast.     12/03/2017 -  Other    Clinic exam: Left breast in the 1 to 2 o'clock position there is a 4 cm irregular mass. Subtle pink erythema without pitting. Left axilla with a palpable 2 cm lymph node.     12/03/2017 Biopsy    Left breast USG core biopsy: IDC, G3, ER+(5%), PR-(0), HER2-(0)  Left axillary USG core biopsy: fragments of LN negative for carcinoma     12/10/2017 Initial Diagnosis    Malignant neoplasm of upper-outer quadrant of left breast in female, estrogen receptor negative (CMS-HCC)     12/10/2017 Tumor Board    MDC recs: Left breast cT2 N0 IDC, G3, +/-/-. Very suspicious LN on imaging and exam. LN core biopsy negative. Discussed considering repeat core (although needle is definitively in the node) vs outback SLN. Will receive NACT. If node neg, plan for outback SLN. Node positivity would affect radiation recs if patient has mastectomy. Meeting genetics today. Will discuss fertility. Also discussed concerns for XRT in young patient in 20's - higher risk of complications from XRT long-term. Consider staging studies.     12/10/2017 -  Cancer Staged    Staging form: Breast, AJCC 8th Edition  - Clinical stage from 12/10/2017: Stage IIB (cT2, cN0(f), cM0, G3, ER+, PR-, HER2-) - Signed by Talbert Cage, DO on 12/10/2017  12/11/2017 Biopsy    Repeat left axillary core biopsy given clinical suspicion: LN negative for carcinoma     12/24/2017 - 02/17/2018 Chemotherapy    OP BREAST AC (DOSE DENSE)  DOXOrubicin 60 mg/m2, Cyclophosphamide 600 mg/m2 every 14 days     01/08/2018 Genetics    Patient has genetic testing done for breast cancer.  Negative [facility] Breast Cancer Guidelines-Based Panel.     02/25/2018 - 05/19/2018 Chemotherapy    OP BREAST PACLItaxel WEEKLY / CARBOplatin EVERY 3 WEEKS  PACLItaxel 80 mg/m2 IV weekly   CARBOplatin AUC 6 IV every 3 weeks   21-day cycle x 4 cycles      05/30/2018 Surgery    Left SSM: Residual IDC, Gr 2 +/-/- measuring 8mm.  Final margins are clear.  0/4 SLN with metastatic disease  Residual Cancer Burden   Tumor bed size: 8 mm x 7 mm  Overall tumor cellularity: 40%  Percentage in situ: 0%  Number of involved lymph nodes: 0  Diameter of largest metastasis: 0 mm  Residual Cancer Burden Score: 1.687  Residual Cancer Burden Class: RCB-II     06/09/2018 -  Cancer Staged    Staging form: Breast, AJCC 8th Edition  - Pathologic stage from 06/09/2018: No Stage Recommended (ypT1b, pN0(sn), cM0, G2, ER+, PR-, HER2-) - Signed by Rudie Meyer, ANP on 06/09/2018       10/23/2018 Surgery    Left chest wall excision by Dr. Dellis Anes. Final Path: IDC Grade 3 ER-PR- HER2- measuring 1 cm in greatest dimension. Final Margins clear.      11/12/2018 -  Cancer Staged    Staging form: Breast, AJCC 8th Edition  - Pathologic stage from 11/12/2018: Stage IB (rpT1b, pN0, cM0, G3, ER-, PR-, HER2-) - Signed by Talbert Cage, DO on 11/13/2018       11/10/2019 - 05/03/2020 Chemotherapy    OP BREAST GEMCITABINE (2 OF 3 WEEKS)  gemcitabine 1,000 mg/m2 IV on days 1, 8, every 21 days     08/10/2020 -  Cancer Staged    Staging form: Breast, AJCC 8th Edition  - Pathologic: Stage IV (pM1) - Signed by Donzetta Kohut, PA on 08/10/2020       09/15/2020 - 09/15/2020 Chemotherapy    OP BREAST SACITUZUMAB GOVITECAN-HZIY  sacituzumab govitecan-hziy 10 mg/kg IV on days 1, 8, every 21 days         Patient Active Problem List   Diagnosis   ??? Malignant neoplasm of upper-outer quadrant of left breast in female, estrogen receptor negative (CMS-HCC)   ??? Microcytic anemia   ??? Symptomatic anemia   ??? PTSD (post-traumatic stress disorder)   ??? 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 Anxiety disorder   ??? S/P breast reconstruction   ??? Arthralgia   ??? Nausea   ??? Hypertrophic osteoarthropathy       Past Medical History:   Diagnosis Date   ??? Anxiety    ??? Depression    ??? Malignant neoplasm of upper-outer quadrant of left breast in female, estrogen receptor negative (CMS-HCC) 12/10/2017   ??? Panic disorder    ??? PTSD (post-traumatic stress disorder)        Past Surgical History:   Procedure Laterality Date   ??? AUGMENTATION MAMMAPLASTY Left 08/2018    left only    ??? BREAST BIOPSY Left 2019    malignant   ??? CHEMOTHERAPY Left 11/2017    May 13 2018   ??? IR INSERT PORT AGE  GREATER THAN 5 YRS  12/18/2017    IR INSERT PORT AGE GREATER THAN 5 YRS 12/18/2017 [name], MD IMG [facility]   ??? MASTECTOMY Left 05/2018   ??? PR BRONCHOSCOPY,BIOPSY Right 10/23/2019    Procedure: Bronchoscopy, Rigid/Flexible, Include Fluoro Guide When Performed; W/Bronch/Endobronch Bx, Single/Mult Site;  Surgeon: Mercy Moore, MD;  Location: MAIN OR [facility];  Service: Pulmonary   ??? PR BRONCHOSCOPY,TRANSBRON ASPIR BX Right 10/23/2019    Procedure: Bronchoscopy, Rigid/Flex, Incl Fluoro; W/Transbronch Ndl Aspirat Bx, Trachea, Main Stem &/Or Lobar Bronchus;  Surgeon: Mercy Moore, MD;  Location: MAIN OR [facility];  Service: Pulmonary   ??? PR BX/REMV,LYMPH NODE,DEEP AXILL Left 05/30/2018 Procedure: BX/EXC LYMPH NODE; OPEN, DEEP AXILRY NODE;  Surgeon: Talbert Cage, DO;  Location: ASC OR [facility];  Service: Surgical Oncology Breast   ??? PR EXC TUMOR SOFT TISSUE NECK/ANT THORAX SUBQ 3+CM Right 10/23/2018    Procedure: Priority EXCISION, TUMOR, SOFT TISSUE OF NECK OR ANTERIOR THORAX, SUBCUTANEOUS; 3 CM OR GREATER;  Surgeon: Talbert Cage, DO;  Location: ASC OR [facility];  Service: Surgical Oncology Breast   ??? PR IMPLNT BIO IMPLNT FOR SOFT TISSUE REINFORCEMENT Left 05/30/2018    Procedure: IMPLANTATION BIOLOGIC IMPLANT(EG, ACELLULAR DERMAL MATRIX) FOR SOFT TISSUE REINFORCEMENT(EG, BREAST, TRUNK);  Surgeon: Arsenio Katz, MD;  Location: ASC OR [facility];  Service: Plastics   ??? PR INSERTION BREAST IMPLANT SAME DAY OF MASTECTOMY Left 05/30/2018    Procedure: IMMED INSRT BREAST PROSTH AFTER MASTOPEX/MASTECT;  Surgeon: Arsenio Katz, MD;  Location: ASC OR [facility];  Service: Plastics   ??? PR INTRAOPERATIVE SENTINEL LYMPH NODE ID W DYE INJECTION Left 05/30/2018    Procedure: INTRAOPERATIVE IDENTIFICATION SENTINEL LYMPH NODE(S) INCLUDE INJECTION NON-RADIOACTIVE DYE, WHEN PERFORMED;  Surgeon: Talbert Cage, DO;  Location: ASC OR [facility];  Service: Surgical Oncology Breast   ??? PR MASTECTOMY, SIMPLE, COMPLETE Left 05/30/2018    Procedure: Urgent MASTECTOMY, SIMPLE, COMPLETE;  Surgeon: Talbert Cage, DO;  Location: ASC OR [facility];  Service: Surgical Oncology Breast   ??? PR RECMPL WND TRUNK 2.6-7.5 CM Left 10/23/2018    Procedure: REPR COMPLX TRUNK; 2.6 CM TO 7.5 CM;  Surgeon: Arsenio Katz, MD;  Location: ASC OR [facility];  Service: Plastics   ??? PR REPLACEMENT TISSUE EXPANDER W/PERMANENT IMPLANT Left 09/24/2018    Procedure: REPLACEMENT OF TISSUE EXPANDER WITH PERMANENT PROSTHESIS;  Surgeon: Arsenio Katz, MD;  Location: MAIN OR [facility];  Service: Plastics   ??? PR REVISION OF RECONSTRUCTED BREAST Left 09/24/2018    Procedure: R21 - REVISION OF RECONSTRUCTED  BREAST;  Surgeon: Arsenio Katz, MD;  Location: MAIN OR [facility];  Service: Plastics   ??? PR SUSPENSION OF BREAST Right 09/24/2018    Procedure: MASTOPEXY FOR SYMMETRY PURPOSES;  Surgeon: Arsenio Katz, MD;  Location: MAIN OR [facility];  Service: Plastics   ??? PR TISSUE EXPANDER PLACEMENT BREAST RECONSTRUCTION Left 05/30/2018    Procedure: BREAST RECON IMMED/DELAY W/EXPANDR W/SUBSQT EXPA;  Surgeon: Arsenio Katz, MD;  Location: ASC OR [facility];  Service: Plastics       Current Outpatient Medications   Medication Sig Dispense Refill   ??? acetaminophen (TYLENOL) 500 MG tablet Take 1,000 mg by mouth every eight (8) hours.     ??? benzonatate (TESSALON PERLES) 100 MG capsule Take 1 capsule (100 mg total) by mouth Three (3) times a day as needed for cough. 60 capsule 1   ??? capecitabine (XELODA) 500 MG tablet Take 4 tablets (2,000 mg total) by  mouth Two (2) times a day for 14 days . Followed by 7 days off drug. 112 tablet 0   ??? ergocalciferol-1,250 mcg, 50,000 unit, (VITAMIN D2-1,250 MCG, 50,000 UNIT,) 1,250 mcg (50,000 unit) capsule Take 1 capsule (1,250 mcg total) by mouth once a week. 4 capsule 2   ??? famotidine (PEPCID) 20 MG tablet Take 1 tablet (20 mg total) by mouth Two (2) times a day. 60 tablet 11   ??? ferrous sulfate 325 (65 FE) MG tablet Take 1 tablet (325 mg total) by mouth daily. Take with orange juice. 30 tablet 3   ??? gabapentin (NEURONTIN) 300 MG capsule Take 1 capsule (300 mg total) by mouth nightly. (Patient not taking: Reported on 10/24/2020) 30 capsule 1   ??? lidocaine-prilocaine (EMLA) 2.5-2.5 % cream Apply topically once as needed for up to 1 dose. Apply small amount to port site 30 minutes - 1 hour prior to access. 30 g 0   ??? loperamide (IMODIUM) 2 mg capsule Take 2 mg by mouth 4 (four) times a day as needed.      ??? methylPREDNISolone (MEDROL DOSEPACK) 4 mg tablet follow package directions (Patient not taking: Reported on 10/24/2020) 1 each 0   ??? naloxone (NARCAN) 4 mg nasal spray One spray in either nostril once for known/suspected opioid overdose. May repeat every 2-3 minutes in alternating nostril til EMS arrives 2 each 1   ??? naproxen (NAPROSYN) 500 MG tablet Take 1 tablet (500 mg total) by mouth in the morning and 1 tablet (500 mg total) in the evening. Take with meals. 60 tablet 2   ??? ondansetron (ZOFRAN-ODT) 4 MG disintegrating tablet Dissolve 1 tablet (4 mg total) on the tongue every eight (8) hours as needed for nausea for up to 30 doses. (Patient not taking: Reported on 10/24/2020) 30 tablet 1   ??? oxyCODONE (ROXICODONE) 15 MG immediate release tablet Take 1-2 tablets (15-30mg ) every 4-6 hours as needed for cancer related pain. 80 tablet 0   ??? pantoprazole (PROTONIX) 40 MG tablet Take 1 tablet (40 mg total) by mouth daily. 90 tablet 3   ??? polyethylene glycol (GLYCOLAX) 17 gram/dose powder Mix 17g (1 cap) in 4-8 ounces of extra liquid each day. 255 g 0   ??? prochlorperazine (COMPAZINE) 10 MG tablet Take 1 tablet (10 mg total) by mouth every six (6) hours as needed (nausea). 30 tablet 2   ??? senna (SENNA) 8.6 mg tablet Take 1-2 tablets by mouth two (2) times a day as needed for constipation. 120 tablet 11   ??? sertraline (ZOLOFT) 100 MG tablet Take 2 tablets (200 mg total) by mouth daily. 60 tablet 2     No current facility-administered medications for this visit.       Allergies:   Allergies   Allergen Reactions   ??? Amoxicillin Other (See Comments)     EDEMA OF FACE   ??? Meloxicam Swelling and Rash     Swelling, itching, rash    ??? Shellfish Containing Products Swelling     Facial and throat swelling       Family History:  Cancer-related family history includes Skin cancer in her mother. There is no history of Breast cancer, Colon cancer, or Ovarian cancer.  She indicated that the status of her mother is unknown. She indicated that the status of her neg hx is unknown.      REVIEW OF SYSTEMS:  A comprehensive review of 10 systems was negative except for pertinent positives noted in HPI.  PHYSICAL EXAM:   VS not reviewed in Epic. Video visit. Weight had been uptrending.  GEN: Awake and alert, pleasant appearing female in no acute distress  PSYCH: Euthymic. Amiable.  HEENT: No facial asymmetry.  CV: No heave.  LUNGS: No increased work of breathing, No cough during visit.  SKIN: No rashes, petechiae or jaundice noted.    Lab Results   Component Value Date    CREATININE 0.46 (L) 10/24/2020     Lab Results   Component Value Date    ALKPHOS 239 (H) 10/07/2020    BILITOT 0.3 10/07/2020    BILIDIR <0.10 10/21/2018    PROT 7.1 10/07/2020    ALBUMIN 2.9 (L) 10/07/2020    ALT <7 (L) 10/07/2020    AST 11 10/07/2020              The patient reports they are currently: at home. I spent 14 minutes on the real-time audio and video with the patient on the date of service. I spent an additional 10 minutes on pre- and post-visit activities on the date of service.     The patient was physically located in [place] or a state in which I am permitted to provide care. The patient and/or parent/guardian understood that s/he may incur co-pays and cost sharing, and agreed to the telemedicine visit. The visit was reasonable and appropriate under the circumstances given the patient's presentation at the time.    The patient and/or parent/guardian has been advised of the potential risks and limitations of this mode of treatment (including, but not limited to, the absence of in-person examination) and has agreed to be treated using telemedicine. The patient's/patient's family's questions regarding telemedicine have been answered.     If the visit was completed in an ambulatory setting, the patient and/or parent/guardian has also been advised to contact their provider???s office for worsening conditions, and seek emergency medical treatment and/or call 911 if the patient deems either necessary.        Mickie Hillier, MD PhD  [facility]

## 2020-10-25 NOTE — Unmapped (Signed)
It was good to see you today on our video visit. I refilled your pain medication and will see you in 2 weeks! Let me know if anything comes up in the meantime. Dr Merlene Morse    ---    Here is some additional information about [facility] Outpatient Oncology Palliative Care:     What is Palliative Care?  Palliative Care is medical care that focuses on managing symptoms of cancer or side effects from cancer treatment. The Outpatient Oncology Palliative Care service uses a team approach to help patients and their caregivers with the goal of maintaining the best quality of life possible during a cancer diagnosis and cancer treatment.   The Outpatient Oncology Palliative Care team serves all outpatient oncology clinics including surgery, gynecology, medicine and radiation oncology.      Who provides Outpatient Palliative Care?  The Outpatient Oncology Palliative Care team includes doctors, nurses, pharmacists, social workers, counselors and nutritionists. The team works with a patient's primary oncology team to create care plans that can help manage symptoms related to cancer and cancer treatment.     What services are offered by Outpatient Oncology Palliative Care?  We can help cancer patients with:  Pain  Symptoms such as nausea, vomiting, anxiety, depression, fatigue, loss of appetite of shortness of breath  Emotional, psychological, or spiritual suffering  Nutritional problems  Concern about medical decisions  Difficulty communicating values, goals and personal choices.     Who are the Outpatient Oncology Palliative Care team members?  Physicians: Mickie Hillier, MD; Kathlynn Grate, MD; Doreatha Martin, MD  Physician Fellows:  Ulanda Edison, MD; Theodoro Clock, MD; Remus Blake, MD; Payton Doughty, MD; Flora Lipps, MD  Nurse Practitioner: Burtis Junes, FNP  Clinical Pharmacist Practitioner:   Nurse Clinical Coordinator: Allegra Lai, RN, BSN, MS, OCN  Administrative Specialist: Dolores Frame     When can I see the Outpatient Oncology Palliative Care team and how do I make contact in between visits?  We see patients Monday through Friday 8am - 4:30pm. If you have questions or concerns during clinic hours, please contact us. Any requests made outside of business hours will be addressed the following business day.      For appointments & questions Monday through Friday 8 AM-- 4:30 PM:  please call [phone] or Toll free [phone].     On Nights, Weekends and Holidays:  Call [phone] and ask for the Oncologist on call.

## 2020-10-26 MED FILL — NAPROXEN 500 MG TABLET: ORAL | 30 days supply | Qty: 60 | Fill #1

## 2020-10-27 ENCOUNTER — Ambulatory Visit
Admit: 2020-10-27 | Discharge: 2020-11-25 | Payer: MEDICAID | Attending: Radiation Oncology | Primary: Radiation Oncology

## 2020-11-02 DIAGNOSIS — C50412 Malignant neoplasm of upper-outer quadrant of left female breast: Principal | ICD-10-CM

## 2020-11-02 DIAGNOSIS — Z171 Estrogen receptor negative status [ER-]: Principal | ICD-10-CM

## 2020-11-02 NOTE — Unmapped (Signed)
Attempted twice to reach Sheri [name] with details of tomorrow's appt, but no answer, voice mail not identified and mailbox full. Will call her tomorrow morning at 11 a.m. Team notified.

## 2020-11-02 NOTE — Unmapped (Addendum)
TC to Sheri [name] in foll up per Sheri Nicely, PA re: a new 'tic' Sheri [name] is having. Sheri [name] states the tic started on 8/26. She describes it seems that I  get really worked up it kind of comes out of nowhere, but Sheri [name] is talking very slow and losing her focus frequently during the conversation. We went through the stroke assessment; she denies facial droop, she is aware of where she is and who she is, she is having visual disturbances but she can't describe it very well.   She states she does not know how many compazine doses she is taking, but states that with the vomiting she's been having she very likely has taken much more than 10mg  every 6 hours. Asked that she not take anymore for now. She cannot take zofran due to it causes severe headache. She notes she has had a severe headache for days now.   States the 'tic' is that she starts talking to herself in the 3rd person, gets very confused, then her head starts jerking back and forth. She does not pass out or lose consciousness. She has had two of these episodes since 8/26--one her father witnessed closer to the 26th and one today. They last several minutes.   She states she has not been able to eat anything more than some popsicles and she is drinking water but then vomits the water back up. We discussed getting sports drinks instead and she will order some. Discussed taht she really sounds like she needs evaluation and possibly some IV fluids. She is in agreement with being evaluated. Updated team and appt is made for 9/8 @ 2pm at our [facility] for 2pm.

## 2020-11-03 ENCOUNTER — Ambulatory Visit
Admit: 2020-11-03 | Discharge: 2020-11-04 | Attending: Student in an Organized Health Care Education/Training Program | Primary: Student in an Organized Health Care Education/Training Program

## 2020-11-03 DIAGNOSIS — R112 Nausea with vomiting, unspecified: Principal | ICD-10-CM

## 2020-11-03 NOTE — Unmapped (Signed)
INTERNAL MEDICINE ADVANCED SAME DAY CLINIC NOTE     11/03/2020    PCP: No PCP Per Patient     Assessment and Plan    [name] was seen today for other.    Diagnoses and all orders for this visit:    Non-intractable vomiting with nausea, unspecified vomiting type      Non bloody N/V complicated by dystonic reaction secondary to compazine overuse: significant and complicated metastatic breast cancer hx. Patient currently received zometa infusions (last infusion one week ago). Hx of nausea with opioids and zofran caused HA so on compazine now. She had worse N/V last couple of days. Took a lot of compazine and had dystonic reaction. Seen today after contacting oncology team about reaction and concern for dehydration. She has had some diarrhea as well. Non toxic on exam and orthostatic vitals are native (blood pressure increase from supine to sit). She does not look dehydrated clinically. Dystonic reaction has stopped since not taking compazine. Etiology of N/V may be related to opioid use/multifactorial, however new diarrhea may indicate gastroenteritis. Due to inability to tolerate  - resume compazine, and take every 6-8 hours as scheduled until her oncologist appointment tomorrow where she can be reassessed. Stop compazine and take 25mg  of benadryl if dystonic reaction occurs again  - oral hydration  - considered change of compazine to zofran, but given history of HA on zofran and dystonic reaciton in the setting of overmedication with compazine opted to not make the change.   - confirmed oncology appointment with patient tomorrow that she plans to attend. May be helpful to have palliative care team assist with visit.    Follow up as scheduled or sooner as needed.    Patient was seen and discussed with Dr. Dietrich Pates who is in agreement with the assessment and plan as outlined above.       Subjective    Problem List:  Patient Active Problem List   Diagnosis   ??? Malignant neoplasm of upper-outer quadrant of left breast in female, estrogen receptor negative (CMS-HCC)   ??? Microcytic anemia   ??? Symptomatic anemia   ??? PTSD (post-traumatic stress disorder)   ??? 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 Anxiety disorder   ??? S/P breast reconstruction   ??? Arthralgia   ??? Nausea   ??? Hypertrophic osteoarthropathy       HPI  Sheri [name] is a 25 y.o. year old female with the above problem list who presents to the same day clinic for   Chief Complaint   Patient presents with   ??? Other     ? Dehydration and Eval for fluids   She has a history of metastatic L breast cancer (2019) s/p chemo and surgery involving the lungs complicated by hypertrophic osteoarthropath from chemo being treated with opioids and Zometa currently (last zometa 8/29). Her course was complicated by psychosocial issues that resulted in missed treatment opportunities. She has a history of nausea related to opioids and chemo that was controlled with compazine after Zofran caused HA. She is here today for N/V for two days. She says she vomited more than six times yesterday (never blood) and had difficulty keeping food and liquids down. She took a lot of compazine due to the vomiting and concern that she was not keeping the medication down. After taking extra compazine she noticed tics in her head that she was able to control by holding her head. She messaged her oncologist office who recommended  holding compazine and being evaluated in person for medication side effects and dehydration. She is not light headed today. She reports some diarrhea the last couple of days so she thinks she make have a virus. She generally does not feel well. She has noticed some weight loss over the last couple of weeks. She is not sure if her N/V is better today, but she feels like she is keeping more Gatorade down. She has an appointment with her oncologist tomorrow. She is no longer having tics.      Meds and allergies were reviewed in Epic    ROS: 10 point ROS was performed and is otherwise negative other than mentioned in the HPI    Objective  PE:  Vitals:    11/03/20 1431   BP: 139/87   Pulse: 119   Temp: 36.8 ??C (98.2 ??F)   SpO2: 96%     General: uncomfortable appearing, but non toxic  Eyes: EOMI, sclera clear, PERRL  ENT: OP clear w/o erythema or exudate  CV: regular, no murmurs  Resp: CTAB, no wheezes or crackles, normal WOB  GI: soft, NTND, NABS  MSK: full ROM, no deformity noted  Skin: clean and dry, no rashes or lesions noted  Ext: no cyanosis/clubbing/edema  Neuro: alert, follows commands. CN II-XII grossly intact    Procedure: None  See procedure note from this encounter    _______________________________________________________________________________________    PLEASE DO NOT DELETE THE SAME DAY METRIC TRACKER. DELETE THIS TEXT ONCE YOU HAVE COMPLETED BELOW    Same Day Metric Tracker:    Referral source for today's visit:  Other, oncology    Referral to other outpatient urgent services? N/A    Did today's visit result in referral to ED or direct admission? No    Without today's visit, would this patient have required an ED visit or hospitalization? No

## 2020-11-03 NOTE — Unmapped (Addendum)
It was great seeing you today, Ms. Criscuolo!    Resume compazine and take scheduled every 6-8 hours until your appointment tomorrow. If you notice the tics again then stop compazine and take 25 mg of benadryl.   Continue to hydrate    Important Numbers  1. How do I schedule an appointment or get a message to my doctor?  Call [phone] or toll free [phone]. Press 1 for English or 2 for Spanish. Our fax number is [phone].     2. I'm sick today and need an appointment. Who do I call during office hours?  Call our clinic and ask for an appointment in the Same Day Clinic.     3. How do I cancel or reschedule an appointment?  Call [phone] or toll free [phone].   You may also cancel your appointment by using [facility] (patient portal).     4. How do I request medication refills?  Request a refill via [facility] (patient portal), call clinic at [phone] or have your pharmacy fax the request to [phone].     5. After Hours, Weekend, or Holidays:  It's after 5:00pm during the week or it's the weekend. How do I get medical attention?     Call the [facility] 24/7 Nursing Line [phone] to get nurse advice.  Go to [facility] walk-in clinic at [address], [place], [place]; [phone]; 7 days a week from 9:00AM - 8:00PM.     Clinic Website: [email or web address]       Our clinic is proud to be recognized as a [employer] [facility] and for our diabetes care (NCQA Diabetes).
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
## 2020-11-03 NOTE — Unmapped (Signed)
TC to Ms Keough to confirm her appt for today and to review how to get to [facility]. Verbalized understanding. TEam updated.

## 2020-11-04 ENCOUNTER — Ambulatory Visit: Admit: 2020-11-04 | Discharge: 2020-11-05 | Payer: MEDICAID

## 2020-11-04 ENCOUNTER — Institutional Professional Consult (permissible substitution): Admit: 2020-11-04 | Discharge: 2020-11-05 | Payer: MEDICAID

## 2020-11-04 ENCOUNTER — Ambulatory Visit: Admit: 2020-11-04 | Discharge: 2020-11-05 | Discharge status: Against Medical Advice | Payer: MEDICAID

## 2020-11-04 DIAGNOSIS — R519 Acute intractable headache, unspecified headache type: Principal | ICD-10-CM

## 2020-11-04 DIAGNOSIS — C50412 Malignant neoplasm of upper-outer quadrant of left female breast: Principal | ICD-10-CM

## 2020-11-04 DIAGNOSIS — R110 Nausea: Principal | ICD-10-CM

## 2020-11-04 DIAGNOSIS — Z171 Estrogen receptor negative status [ER-]: Principal | ICD-10-CM

## 2020-11-04 LAB — CBC W/ AUTO DIFF
BASOPHILS ABSOLUTE COUNT: 0 10*9/L (ref 0.0–0.1)
BASOPHILS RELATIVE PERCENT: 0.3 %
EOSINOPHILS ABSOLUTE COUNT: 0 10*9/L (ref 0.0–0.5)
EOSINOPHILS RELATIVE PERCENT: 0 %
HEMATOCRIT: 34.8 % (ref 34.0–44.0)
HEMOGLOBIN: 11.5 g/dL (ref 11.3–14.9)
LYMPHOCYTES ABSOLUTE COUNT: 0.7 10*9/L — ABNORMAL LOW (ref 1.1–3.6)
LYMPHOCYTES RELATIVE PERCENT: 5.8 %
MEAN CORPUSCULAR HEMOGLOBIN CONC: 32.9 g/dL (ref 32.0–36.0)
MEAN CORPUSCULAR HEMOGLOBIN: 25.9 pg (ref 25.9–32.4)
MEAN CORPUSCULAR VOLUME: 78.6 fL (ref 77.6–95.7)
MEAN PLATELET VOLUME: 6.7 fL — ABNORMAL LOW (ref 6.8–10.7)
MONOCYTES ABSOLUTE COUNT: 1.4 10*9/L — ABNORMAL HIGH (ref 0.3–0.8)
MONOCYTES RELATIVE PERCENT: 10.9 %
NEUTROPHILS ABSOLUTE COUNT: 10.5 10*9/L — ABNORMAL HIGH (ref 1.8–7.8)
NEUTROPHILS RELATIVE PERCENT: 83 %
NUCLEATED RED BLOOD CELLS: 0 /100{WBCs} (ref <=4)
PLATELET COUNT: 645 10*9/L — ABNORMAL HIGH (ref 150–450)
RED BLOOD CELL COUNT: 4.43 10*12/L (ref 3.95–5.13)
RED CELL DISTRIBUTION WIDTH: 17.6 % — ABNORMAL HIGH (ref 12.2–15.2)
WBC ADJUSTED: 12.6 10*9/L — ABNORMAL HIGH (ref 3.6–11.2)

## 2020-11-04 LAB — CANCER ANTIGEN 27.29: CA 27-29: 63.5 U/mL — ABNORMAL HIGH (ref <=38.6)

## 2020-11-04 LAB — COMPREHENSIVE METABOLIC PANEL
ALBUMIN: 3.2 g/dL — ABNORMAL LOW (ref 3.4–5.0)
ALKALINE PHOSPHATASE: 190 U/L — ABNORMAL HIGH (ref 46–116)
ALT (SGPT): <7 U/L — ABNORMAL LOW (ref 10–49)
ANION GAP: 11 mmol/L (ref 5–14)
AST (SGOT): 10 U/L (ref <=34)
BILIRUBIN TOTAL: 0.5 mg/dL (ref 0.3–1.2)
BLOOD UREA NITROGEN: <5 mg/dL — ABNORMAL LOW (ref 9–23)
CALCIUM: 9.2 mg/dL (ref 8.7–10.4)
CHLORIDE: 98 mmol/L (ref 98–107)
CO2: 25.5 mmol/L (ref 20.0–31.0)
CREATININE: 0.36 mg/dL — ABNORMAL LOW
EGFR CKD-EPI (2021) FEMALE: >90 mL/min/{1.73_m2} (ref >=60)
GLUCOSE RANDOM: 151 mg/dL (ref 70–179)
POTASSIUM: 3.3 mmol/L — ABNORMAL LOW (ref 3.4–4.8)
PROTEIN TOTAL: 8.4 g/dL — ABNORMAL HIGH (ref 5.7–8.2)
SODIUM: 134 mmol/L — ABNORMAL LOW (ref 135–145)

## 2020-11-04 MED ADMIN — ketorolac (TORADOL) injection 30 mg: 30 mg | INTRAVENOUS | @ 18:00:00 | Stop: 2020-11-04

## 2020-11-04 MED ADMIN — LORazepam (ATIVAN) injection 0.5 mg: .5 mg | INTRAVENOUS | @ 17:00:00 | Stop: 2020-11-04

## 2020-11-04 MED ADMIN — sodium chloride 0.9% (NS) bolus 1,000 mL: 1000 mL | INTRAVENOUS | @ 17:00:00 | Stop: 2020-11-04

## 2020-11-04 MED ADMIN — metoclopramide (REGLAN) injection 10 mg: 10 mg | INTRAVENOUS | @ 18:00:00 | Stop: 2020-11-04

## 2020-11-04 NOTE — Unmapped (Signed)
Pt c/o headache and n/v from Tuesday. Denies any fever/chills. H/o left breast cancer. Pt was at the Oncology clinic today and had migraine cocktail and IVF. Pt came in with a port access to right chest wall from oncology clinic. Had blood work done this am

## 2020-11-04 NOTE — Unmapped (Addendum)
Sheri [name] presents to [facility] clinic today with her father via wheelchair for scheduled visit with C.Urban PA, however she endorses V/V/D x3 days with increase in symptoms overnight. Pt and father state she has been vomiting every 30 minutes. She has been taking her Zofran and Comapzine but continues with vomiting. Pt has vomited x2 in the 15 minutes she has been in clinic, bile. C.Urban PA notified of pt's condition, orders obtained. Port accessed, labs obtained, IVF started, and IV Ativan given for N/V. [name] also c/o headache rating pain 8/10 (she is typically very conservative with her pain scale rating) along with her regular cancer associated pain, but does not have any c/o of abdominal pain. Pt was made comfortable with a makeshift bed in exam room and blankets. She is resting with father at bedside. Liter of IVF complete. Pt still complains of HA 8/10, nausea, tolerating small sips of gatorade. Reglan and Toradol given via port. After repeated evaluations by PA Urban and Dr.C. Avis Epley pt is to be transferred to ED here in [place]. Report given to ED charge nurse G.Lacretia Nicks by Helmut Muster nurse navigator. [name] transported to ED via wheelchair by Clinical research associate.

## 2020-11-04 NOTE — Unmapped (Signed)
I saw and evaluated the patient, participating in the key portions of the service.  I reviewed the resident’s note.  I agree with the resident’s findings and plan. Sheri [name] R Armie Moren, MD

## 2020-11-07 ENCOUNTER — Institutional Professional Consult (permissible substitution): Admit: 2020-11-07 | Discharge: 2020-11-08 | Payer: MEDICAID

## 2020-11-07 DIAGNOSIS — R519 Acute intractable headache, unspecified headache type: Principal | ICD-10-CM

## 2020-11-07 DIAGNOSIS — Z171 Estrogen receptor negative status [ER-]: Principal | ICD-10-CM

## 2020-11-07 DIAGNOSIS — R110 Nausea: Principal | ICD-10-CM

## 2020-11-07 DIAGNOSIS — C50412 Malignant neoplasm of upper-outer quadrant of left female breast: Principal | ICD-10-CM

## 2020-11-07 MED ADMIN — heparin, porcine (PF) 100 unit/mL injection 500 Units: 500 [IU] | INTRAVENOUS | @ 21:00:00 | Stop: 2020-11-07

## 2020-11-07 NOTE — Unmapped (Signed)
Patient in today for a port flush due to staying accessed for CT scan tomorrow. Dressing peeling at edges so dressing changed at this time. Blood return noted from port and flushed with heparin.

## 2020-11-07 NOTE — Unmapped (Addendum)
attempted to call patient, but she wasn't available.  Unable to leave a message as the mailbox was full.  Will send the patient a Mychart message to see if I can reach her.    ----- Message from [facility], [place] sent at 11/07/2020  9:06 AM EDT -----  Regarding: Left ED on Friday 9/9 //Port Needle in Place  Hi Sheri [name]~    Can you call Sheri [name] this morning? We sent her to ED on Friday from clinic for ongoing vomiting, headaches for evaluation and possible brain MRI.  It looks like she left ED without evaluation.  Also she sent me a mychart message that she left with the needle still in the port.    I need an update on how she is feeling and then we need to at a minimum get the needle out.       Thanks  claire

## 2020-11-08 ENCOUNTER — Ambulatory Visit: Admit: 2020-11-08 | Discharge: 2020-11-09 | Payer: MEDICAID

## 2020-11-08 ENCOUNTER — Telehealth: Admit: 2020-11-08 | Discharge: 2020-11-09 | Payer: MEDICAID

## 2020-11-08 MED ORDER — OXYCODONE 15 MG TABLET
ORAL_TABLET | 0 refills | 0 days | Status: CP
Start: 2020-11-08 — End: ?

## 2020-11-08 MED ADMIN — iohexoL (OMNIPAQUE) 350 mg iodine/mL solution 100 mL: 100 mL | INTRAVENOUS | @ 17:00:00 | Stop: 2020-11-08

## 2020-11-08 NOTE — Unmapped (Signed)
Spoke with Sheri [name] via phone as she was en route to head CT. Says that her headache is unrelenting. Continues to use oxycodone 1-2 tabs q4-6h PRN and requests refill sent to home pharmacy. Will touch base via phone/video at next opportunity considering new finding of brain metastases on CT and currently en route to ED.    [name], MD PhD  [facility]

## 2020-11-08 NOTE — Unmapped (Signed)
Per Dr. Avis Epley called the ED at [facility] and informed the Charge RN and let them know that we are sending  this patient to the ED. She has new brain mets and midline shift. Dr. Avis Epley has talked to her rad onc who thinks she needs neurosurg eval and steroid and urgent MRI.      Message was read back and verbalized.

## 2020-11-08 NOTE — Unmapped (Signed)
Attempted call for video visit call routed to VM.

## 2020-11-08 NOTE — Unmapped (Signed)
Called patient regarding abnormal head CT: There is an enhancing mass in the left frontal lobe with significant vasogenic edema causing approximately 1 cm of rightward midline shift. There are additional enhancing lesions in the left cerebellar hemisphere and the left superior frontoparietal region. This is concerning for metastatic disease. Recommend follow-up with MRI when patient is stable.    Discussed with rad onc colleagues Drs. Rogelio Seen + Mina Marble.  Recommended pt got [facility] ED for evaluation, expedited brain MRI + neurosurg/rad onc evaluation.  Conveyed message to patient and patient's friend Sam [phone]).  Unable to connect with parents despite mult calls; voicemail boxes full.  There was a concern regarding tissue expander however patient confirmed that it has been swapped for implant and no longer in place.  Also discuss with friend that patient may be leaning towards palliative care.  Encouraged ED evaluation for best recommendations.

## 2020-11-09 ENCOUNTER — Ambulatory Visit: Admit: 2020-11-09 | Discharge: 2020-11-13 | Payer: MEDICAID

## 2020-11-09 ENCOUNTER — Encounter
Admit: 2020-11-09 | Discharge: 2020-11-13 | Disposition: A | Discharge status: Home / Self Care | Payer: MEDICAID | Attending: Certified Registered"

## 2020-11-09 ENCOUNTER — Ambulatory Visit: Admit: 2020-11-09 | Discharge: 2020-11-13 | Disposition: A | Discharge status: Home / Self Care | Payer: MEDICAID

## 2020-11-09 ENCOUNTER — Encounter: Admit: 2020-11-09 | Discharge: 2020-11-13 | Disposition: A | Discharge status: Home / Self Care | Payer: MEDICAID

## 2020-11-09 LAB — CBC W/ AUTO DIFF
BASOPHILS ABSOLUTE COUNT: 0.1 10*9/L (ref 0.0–0.1)
BASOPHILS RELATIVE PERCENT: 0.8 %
EOSINOPHILS ABSOLUTE COUNT: 0.1 10*9/L (ref 0.0–0.5)
EOSINOPHILS RELATIVE PERCENT: 1.2 %
HEMATOCRIT: 30.8 % — ABNORMAL LOW (ref 34.0–44.0)
HEMOGLOBIN: 10.1 g/dL — ABNORMAL LOW (ref 11.3–14.9)
LYMPHOCYTES ABSOLUTE COUNT: 1 10*9/L — ABNORMAL LOW (ref 1.1–3.6)
LYMPHOCYTES RELATIVE PERCENT: 14.2 %
MEAN CORPUSCULAR HEMOGLOBIN CONC: 32.6 g/dL (ref 32.0–36.0)
MEAN CORPUSCULAR HEMOGLOBIN: 25.6 pg — ABNORMAL LOW (ref 25.9–32.4)
MEAN CORPUSCULAR VOLUME: 78.4 fL (ref 77.6–95.7)
MEAN PLATELET VOLUME: 7 fL (ref 6.8–10.7)
MONOCYTES ABSOLUTE COUNT: 0.7 10*9/L (ref 0.3–0.8)
MONOCYTES RELATIVE PERCENT: 9.9 %
NEUTROPHILS ABSOLUTE COUNT: 5.2 10*9/L (ref 1.8–7.8)
NEUTROPHILS RELATIVE PERCENT: 73.9 %
PLATELET COUNT: 541 10*9/L — ABNORMAL HIGH (ref 150–450)
RED BLOOD CELL COUNT: 3.93 10*12/L — ABNORMAL LOW (ref 3.95–5.13)
RED CELL DISTRIBUTION WIDTH: 17.7 % — ABNORMAL HIGH (ref 12.2–15.2)
WBC ADJUSTED: 7 10*9/L (ref 3.6–11.2)

## 2020-11-09 LAB — COMPREHENSIVE METABOLIC PANEL
ALBUMIN: 3.1 g/dL — ABNORMAL LOW (ref 3.4–5.0)
ALKALINE PHOSPHATASE: 164 U/L — ABNORMAL HIGH (ref 46–116)
ALT (SGPT): <7 U/L — ABNORMAL LOW (ref 10–49)
ANION GAP: 7 mmol/L (ref 5–14)
AST (SGOT): 8 U/L (ref <=34)
BILIRUBIN TOTAL: 0.3 mg/dL (ref 0.3–1.2)
BLOOD UREA NITROGEN: 6 mg/dL — ABNORMAL LOW (ref 9–23)
BUN / CREAT RATIO: 15
CALCIUM: 8.4 mg/dL — ABNORMAL LOW (ref 8.7–10.4)
CHLORIDE: 103 mmol/L (ref 98–107)
CO2: 26 mmol/L (ref 20.0–31.0)
CREATININE: 0.41 mg/dL — ABNORMAL LOW
EGFR CKD-EPI (2021) FEMALE: >90 mL/min/{1.73_m2} (ref >=60)
GLUCOSE RANDOM: 96 mg/dL (ref 70–179)
POTASSIUM: 2.7 mmol/L — ABNORMAL LOW (ref 3.4–4.8)
PROTEIN TOTAL: 7.4 g/dL (ref 5.7–8.2)
SODIUM: 136 mmol/L (ref 135–145)

## 2020-11-09 LAB — MAGNESIUM: MAGNESIUM: 2 mg/dL (ref 1.6–2.6)

## 2020-11-09 MED ADMIN — gadobenate dimeglumine (MULTIHANCE) 529 mg/mL (0.1mmol/0.2mL) solution 20 mL: 20 mL | INTRAVENOUS | @ 04:00:00 | Stop: 2020-11-08

## 2020-11-09 MED ADMIN — oxyCODONE (ROXICODONE) immediate release tablet 30 mg: 30 mg | ORAL | @ 10:00:00 | Stop: 2020-11-09

## 2020-11-09 MED ADMIN — ferrous sulfate tablet 325 mg: 325 mg | ORAL | @ 18:00:00

## 2020-11-09 MED ADMIN — polyethylene glycol (MIRALAX) packet 17 g: 17 g | ORAL | @ 17:00:00 | Stop: 2020-11-29

## 2020-11-09 MED ADMIN — docusate sodium (COLACE) capsule 100 mg: 100 mg | ORAL | @ 18:00:00

## 2020-11-09 MED ADMIN — lactated ringers bolus 1,000 mL: 1000 mL | INTRAVENOUS | @ 07:00:00 | Stop: 2020-11-09

## 2020-11-09 MED ADMIN — pantoprazole (PROTONIX) EC tablet 40 mg: 40 mg | ORAL | @ 18:00:00

## 2020-11-09 MED ADMIN — LORazepam (ATIVAN) tablet 1 mg: 1 mg | ORAL | @ 10:00:00 | Stop: 2020-11-09

## 2020-11-09 MED ADMIN — LORazepam (ATIVAN) tablet 1 mg: 1 mg | ORAL | @ 05:00:00 | Stop: 2020-11-09

## 2020-11-09 MED ADMIN — dexamethasone (DECADRON) 4 mg/mL injection 10 mg: 10 mg | INTRAVENOUS | @ 13:00:00 | Stop: 2020-11-11

## 2020-11-09 MED ADMIN — oxyCODONE (ROXICODONE) immediate release tablet 30 mg: 30 mg | ORAL | @ 05:00:00 | Stop: 2020-11-09

## 2020-11-09 MED ADMIN — LORazepam (ATIVAN) tablet 1 mg: 1 mg | ORAL | @ 22:00:00

## 2020-11-09 MED ADMIN — prochlorperazine (COMPAZINE) 10 mg in sodium chloride (NS) 0.9 % 25 mL IVPB: 10 mg | INTRAVENOUS | @ 07:00:00 | Stop: 2020-11-09

## 2020-11-09 MED ADMIN — potassium chloride (KLOR-CON) CR tablet 40 mEq: 40 meq | ORAL | @ 18:00:00 | Stop: 2020-11-09

## 2020-11-09 MED ADMIN — dexamethasone (DECADRON) 4 mg/mL injection 10 mg: 10 mg | INTRAVENOUS | @ 19:00:00 | Stop: 2020-11-11

## 2020-11-09 MED ADMIN — famotidine (PEPCID) tablet 20 mg: 20 mg | ORAL | @ 18:00:00

## 2020-11-09 MED ADMIN — LORazepam (ATIVAN) tablet 2 mg: 2 mg | ORAL | @ 14:00:00 | Stop: 2020-11-09

## 2020-11-09 MED ADMIN — oxyCODONE (ROXICODONE) immediate release tablet 10 mg: 10 mg | ORAL | @ 18:00:00 | Stop: 2020-11-23

## 2020-11-09 MED ADMIN — dexamethasone (DECADRON) 4 mg/mL injection 10 mg: 10 mg | INTRAVENOUS | @ 07:00:00 | Stop: 2020-11-09

## 2020-11-09 MED ADMIN — acetaminophen (TYLENOL) tablet 1,000 mg: 1000 mg | ORAL | @ 07:00:00 | Stop: 2020-11-09

## 2020-11-09 NOTE — Unmapped (Addendum)
Brief Oncology Plan of Care    Sheri [name] is a 25yo woman with L breast cancer ER+ (5%), PR-, HER2-, metastatic to lung, hypertrophic osteoarthropathy, and PTSD, who presents to the [facility] ED with persistent headache and recent CT scan showing likely new brain mets.     She was last seen in Dr. Avis Epley clinic 9/9, at which time she was noted to have nausea, vomiting, diarrhea and no PO intake x 3 days with associated frontal headache. She was given IVF, ativan, toradol, and reglan in clinic with minimal improvement. She was referred to the ED, but left prior to evaluation. Her oncology team ordered a CT head that was done on 9/13 and showed an enhancing mass in L frontal lobe with significant edema and ~1cm of midline shift, concerning for metastatic disease. Her team referred her to the [facility] ED, where she presented tonight and had MRI brain done that confirmed Multiple T1 hypointense enhancing lesions with surrounding vasogenic edema, concerning for intracranial metastatic disease and detailed above. Largest of which is in the left frontal lobe measuring 1.8 x 1.5 cm with surrounding vasogenic edema, mild effacement of the left lateral ventricle and 7 mm rightward midline shift.    She was given 10mg  dexamethasone, IVF, ativan, and oxycodone. Neurosurgery was consulted, recommendations pending. Oncology consulted for additional management recommendations.     Brief A/P  - Agree with plan from ED team to admit patient - either to neurosurgical service or to medicine (preferably oncology service, otherwise medicine service with oncology consult)  - On admission would additionally consult Rad Onc for evaluation of role of XRT  - Agree with corticosteroids, would continue with dexamethasone 4mg  q6h   - Continued symptom control per primary team - would likely benefit from palliative care consult for assistance as she has seen them in the past in clinic    Raul Del, MD, PhD  Hematology/Oncology Fellow, PGY3    Discussed with Dr. Lendell Caprice

## 2020-11-09 NOTE — Unmapped (Signed)
[facility]  Emergency Department Provider Note       ED Clinical Impression     Final diagnoses:   New daily persistent headache (Primary)        Impression, Medical Decision Making, ED Course     Impression: 25 y.o. female who has a past medical history of Anxiety, Depression, Malignant neoplasm of upper-outer quadrant of left breast in female, estrogen receptor negative (CMS-HCC) (12/10/2017), Panic disorder, and PTSD (post-traumatic stress disorder). who presents with headache, referred to Emergency Department for emergent MRI and Neurosurgery consult as described below.    BP 137/77  - Pulse 86  - Temp 37 ??C (98.6 ??F) (Temporal)  - Resp 15  - Ht 167.6 cm (5' 6)  - Wt (!) 131.8 kg (290 lb 9.1 oz)  - LMP 11/03/2020  - SpO2 97%  - BMI 46.90 kg/m??     Initial vitals as above, hemodynamically stable.  Upon initial evaluation in the emergency department, patient is non-toxic appearing but appears anxious. Heart regular rate and rhythm, lungs clear to auscultation bilaterally. No obvious neurologic deficits.    DDx/MDM: 25 year old female with history as below including breast cancer here with headaches x 1-2 weeks. Outside CT imaging showed diffuse brain mets with approximately 1 cm of midline shift Will obtain MRI to better evaluate, anticipate Neurosurgical consult. Will order home doses of Ativan and Oxycodone per Palliative note and 10mg  of Decadron for cerebral edema.     Diagnostic workup as below. Will sign out patient to oncoming physician at 7am pending inpatient team placement.     Orders Placed This Encounter   Procedures   ??? COVID-19 PCR   ??? Urine Culture   ??? MRI Brain W Wo Contrast   ??? Comprehensive Metabolic Panel   ??? CBC w/ Differential   ??? Urinalysis with Culture Reflex   ??? Magnesium Level   ??? aPTT   ??? Basic Metabolic Panel   ??? CBC   ??? PT-INR   ??? NPO Sips with meds; Operating room   ??? Notify Provider   ??? Notify Provider   ??? Strict intake and output   ??? Measure height on admission   ??? Weigh patient on admission   ??? Neuro checks   ??? Vital signs   ??? Nursing oxygen orders / instructions   ??? Place and maintain sequential compression device   ??? Chlorhexidine gluconate shower/bath   ??? Chlorhexidine gluconate shower/bath   ??? Misc nursing order (specify)   ??? POCT Glucose - RN Obtain   ??? POCT Pregnancy, Urine - RN Obtain   ??? Full Code   ??? Consult to Nutrition Services   ??? ECG 12 Lead   ??? Type and Screen   ??? Place Patient in Bed   ??? ED Admit Decision   ??? Transfer patient       ED Course as of 11/10/20 0815   Wed Nov 09, 2020   1610 Patient received Ativan and Oxycodone upon arrival in the ED as recommended by her Palliative team.     Patient with persistent headache. Given brain mets visualized on MRI, will order 10mg  of decadron. Will also treat her headache with Tylenol, Compazine, and fluid bolus and reassess for improvement.    Paged Neurosurgery for recommendations    0142 MRI Brain W Wo Contrast  IMPRESSION:  Multiple T1 hypointense enhancing lesions with surrounding vasogenic edema, concerning for intracranial metastatic disease and detailed above. Largest of which is in the  left frontal lobe measuring 1.8 x 1.5 cm with surrounding vasogenic edema, mild effacement of the left lateral ventricle and 7 mm rightward midline shift.  ??  No diffusion weighted signal abnormality to suggest acute infarct.   0981 Spoke with neurosurgery-they agree with the previously ordered Decadron, would like oncology on board to assist with determining prognosis to guide any conversations about possible neurosurgical intervention.  Will page oncology.  No other recommendations at this time.   57 Spoke with Oncology, no further recs at this time. No need for additional imaging to evaluate for other new mets as she denies any other symptoms. If Neurosurgery does not admit, they will take her on their service, will consult if she goes to Neurosurgery.    0422 Reached out to Neurosurgery to ask if they are admitting patient, have not heard back.     Spoke with Oncology- recommend continuing her Decadron q6, will put in order. They also recommend reaching out to Rad Onc so they can see her first thing in the morning.     On reassessment, patient is sleeping comfortably, in no acute distress.    80 Repaged Neurosurgery    [phone] Neurosurgery will not admit as her mets are non-operable given extent of disease. Will page out to Medicine    [phone] Will sign out patient to oncoming physician at 7am pending team placement.     Patient has q 6 hour Decadron ordered. Will likely need additional doses of Ativan and oxycodone. Per palliative note on 9/13, her home dose of oxycodone is 30mg  q 4 hours     Patient requires admission for expedited work up and management of new brain mets. If Neurosurgery is not taking her on their service, Oncology is aware and will take the patient either on their service or will consult if there are no Med O beds and she goes to another medicine team. Rad Onc is also already aware of patient.        ____________________________________________    The case was discussed with Dr. Odie Sera, MD, who is in agreement with the above assessment and plan.    Dictation software was used while making this note. Please excuse any errors made with dictation software.     Additional Medical Decision Making     I have reviewed the vital signs and the nursing notes. Labs and radiology results that were available during my care of the patient were independently reviewed by me and considered in my medical decision making.     I independently visualized the EKG tracing if performed.  I independently visualized the radiology images if performed.  I reviewed the patient's prior medical records if available.  Additional history obtained from family if available.  I discussed the case with the admitting provider and the consulting services if the patient was admitted and/or consulting services were utilized.     History     Chief Complaint  Chief Complaint   Patient presents with   ??? Evaluation of Abnormal Diagnostic Test       HPI   Sheri [name] is a 25 y.o. female with past medical history as below who presents with headaches over the past 2 weeks.  He had an outpatient head CT earlier today that was concerning for brain metastasis with significant edema, approximate 1 cm midline shift.  She was referred to the ED for emergent MRI and neurosurgical evaluation.    She had several episodes of nausea and vomiting in  the context of a URI 2 weeks ago, but has not had vomiting for the past week.     ROS:   Constitutional: Negative for fever.  Eyes: Negative for visual changes.  ENT: Negative for sore throat.  Cardiovascular: Negative for palpitations. Negative for chest pain.  Respiratory: Negative for shortness of breath.  Gastrointestinal: Negative for abdominal pain, vomiting, or diarrhea.  Genitourinary: Negative for dysuria.   Musculoskeletal: Negative for back pain.  Skin: Negative for rash.  Neurological: Positive for headaches, negative for focal weakness, or numbness.     All other systems have been reviewed and are negative except as otherwise documented.    Past Medical History:   Diagnosis Date   ??? Anxiety    ??? Depression    ??? Malignant neoplasm of upper-outer quadrant of left breast in female, estrogen receptor negative (CMS-HCC) 12/10/2017   ??? Panic disorder    ??? PTSD (post-traumatic stress disorder)        Past Surgical History:   Procedure Laterality Date   ??? AUGMENTATION MAMMAPLASTY Left 08/2018    left only    ??? BREAST BIOPSY Left 2019    malignant   ??? CHEMOTHERAPY Left 11/2017    May 13 2018   ??? IR INSERT PORT AGE GREATER THAN 5 YRS  12/18/2017    IR INSERT PORT AGE GREATER THAN 5 YRS 12/18/2017 [name], MD IMG VIR [facility]   ??? MASTECTOMY Left 05/2018   ??? PR BRONCHOSCOPY,BIOPSY Right 10/23/2019    Procedure: Bronchoscopy, Rigid/Flexible, Include Fluoro Guide When Performed; W/Bronch/Endobronch Bx, Single/Mult Site; Surgeon: Mercy Moore, MD;  Location: MAIN OR [facility];  Service: Pulmonary   ??? PR BRONCHOSCOPY,TRANSBRON ASPIR BX Right 10/23/2019    Procedure: Bronchoscopy, Rigid/Flex, Incl Fluoro; W/Transbronch Ndl Aspirat Bx, Trachea, Main Stem &/Or Lobar Bronchus;  Surgeon: Mercy Moore, MD;  Location: MAIN OR [facility];  Service: Pulmonary   ??? PR BX/REMV,LYMPH NODE,DEEP AXILL Left 05/30/2018    Procedure: BX/EXC LYMPH NODE; OPEN, DEEP AXILRY NODE;  Surgeon: Talbert Cage, DO;  Location: ASC OR [facility];  Service: Surgical Oncology Breast   ??? PR EXC TUMOR SOFT TISSUE NECK/ANT THORAX SUBQ 3+CM Right 10/23/2018    Procedure: Priority EXCISION, TUMOR, SOFT TISSUE OF NECK OR ANTERIOR THORAX, SUBCUTANEOUS; 3 CM OR GREATER;  Surgeon: Talbert Cage, DO;  Location: ASC OR [facility];  Service: Surgical Oncology Breast   ??? PR IMPLNT BIO IMPLNT FOR SOFT TISSUE REINFORCEMENT Left 05/30/2018    Procedure: IMPLANTATION BIOLOGIC IMPLANT(EG, ACELLULAR DERMAL MATRIX) FOR SOFT TISSUE REINFORCEMENT(EG, BREAST, TRUNK);  Surgeon: Arsenio Katz, MD;  Location: ASC OR [facility];  Service: Plastics   ??? PR INSERTION BREAST IMPLANT SAME DAY OF MASTECTOMY Left 05/30/2018    Procedure: IMMED INSRT BREAST PROSTH AFTER MASTOPEX/MASTECT;  Surgeon: Arsenio Katz, MD;  Location: ASC OR [facility];  Service: Plastics   ??? PR INTRAOPERATIVE SENTINEL LYMPH NODE ID W DYE INJECTION Left 05/30/2018    Procedure: INTRAOPERATIVE IDENTIFICATION SENTINEL LYMPH NODE(S) INCLUDE INJECTION NON-RADIOACTIVE DYE, WHEN PERFORMED;  Surgeon: Talbert Cage, DO;  Location: ASC OR [facility];  Service: Surgical Oncology Breast   ??? PR MASTECTOMY, SIMPLE, COMPLETE Left 05/30/2018    Procedure: Urgent MASTECTOMY, SIMPLE, COMPLETE;  Surgeon: Talbert Cage, DO;  Location: ASC OR [facility];  Service: Surgical Oncology Breast   ??? PR RECMPL WND TRUNK 2.6-7.5 CM Left 10/23/2018    Procedure: REPR COMPLX TRUNK; 2.6 CM TO 7.5 CM;  Surgeon: Arsenio Katz, MD;  Location:  ASC OR [facility];  Service: Plastics   ??? PR REPLACEMENT TISSUE EXPANDER W/PERMANENT IMPLANT Left 09/24/2018    Procedure: REPLACEMENT OF TISSUE EXPANDER WITH PERMANENT PROSTHESIS;  Surgeon: Arsenio Katz, MD;  Location: MAIN OR [facility];  Service: Plastics   ??? PR REVISION OF RECONSTRUCTED BREAST Left 09/24/2018    Procedure: R21 - REVISION OF RECONSTRUCTED  BREAST;  Surgeon: Arsenio Katz, MD;  Location: MAIN OR [facility];  Service: Plastics   ??? PR SUSPENSION OF BREAST Right 09/24/2018    Procedure: MASTOPEXY FOR SYMMETRY PURPOSES;  Surgeon: Arsenio Katz, MD;  Location: MAIN OR [facility];  Service: Plastics   ??? PR TISSUE EXPANDER PLACEMENT BREAST RECONSTRUCTION Left 05/30/2018    Procedure: BREAST RECON IMMED/DELAY W/EXPANDR W/SUBSQT EXPA;  Surgeon: Arsenio Katz, MD;  Location: ASC OR [facility];  Service: Plastics         Current Facility-Administered Medications:   ???  [MAR Hold] acetaminophen (TYLENOL) tablet 650 mg, 650 mg, Oral, Q6H PRN, Wilford Sports, MD  ???  [MAR Hold] acetaminophen (TYLENOL) tablet 650 mg, 650 mg, Oral, Q6H PRN, Wilford Sports, MD  ???  [MAR Hold] albuterol (PROVENTIL HFA;VENTOLIN HFA) 90 mcg/actuation inhaler 2 puff, 2 puff, Inhalation, Q4H PRN, Wilford Sports, MD  ???  Mitzi Hansen Hold] bisacodyL (DULCOLAX) suppository 10 mg, 10 mg, Rectal, Daily PRN, Wilford Sports, MD  ???  [MAR Hold] calcium carbonate (TUMS) chewable tablet 200 mg of elem calcium, 200 mg of elem calcium, Oral, TID PRN **AND** [MAR Hold] simethicone (MYLICON) chewable tablet 80 mg, 80 mg, Oral, Q6H PRN, Wilford Sports, MD  ???  [MAR Hold] dexamethasone (DECADRON) 4 mg/mL injection 4 mg, 4 mg, Intravenous, Q6H, Bonne Dolores, MD, 4 mg at 11/10/20 0557  ???  [MAR Hold] docusate sodium (COLACE) capsule 100 mg, 100 mg, Oral, BID, Wilford Sports, MD, 100 mg at 11/09/20 2050  ???  [MAR Hold] ergocalciferol-1,250 mcg (50,000 unit) (DRISDOL) capsule 1,250 mcg, 1,250 mcg, Oral, Weekly, Wilford Sports, MD  ???  [MAR Hold] famotidine (PEPCID) tablet 20 mg, 20 mg, Oral, BID, Wilford Sports, MD, 20 mg at 11/09/20 2050  ???  [MAR Hold] ferrous sulfate tablet 325 mg, 325 mg, Oral, Daily, Wilford Sports, MD, 325 mg at 11/09/20 1331  ???  [MAR Hold] hydrALAZINE (APRESOLINE) injection 10 mg, 10 mg, Intravenous, Q4H PRN, Wilford Sports, MD  ???  [MAR Hold] labetaloL (NORMODYNE,TRANDATE) injection 10 mg, 10 mg, Intravenous, Q4H PRN, Wilford Sports, MD  ???  [MAR Hold] LORazepam (ATIVAN) tablet 1 mg, 1 mg, Oral, Q6H PRN, Cherre Robins, MD, 1 mg at 11/10/20 0557  ???  [MAR Hold] melatonin tablet 3 mg, 3 mg, Oral, QPM, Wilford Sports, MD, 3 mg at 11/09/20 2049  ???  [MAR Hold] oxyCODONE (ROXICODONE) immediate release tablet 10 mg, 10 mg, Oral, Q6H PRN, 10 mg at 11/10/20 0451 **OR** [MAR Hold] oxyCODONE (ROXICODONE) immediate release tablet 10 mg, 10 mg, Oral, Q6H PRN, Wilford Sports, MD  ???  [MAR Hold] pantoprazole (PROTONIX) EC tablet 40 mg, 40 mg, Oral, Daily, Wilford Sports, MD, 40 mg at 11/09/20 1332  ???  [MAR Hold] polyethylene glycol (MIRALAX) packet 17 g, 17 g, Oral, Daily, Wilford Sports, MD, 17 g at 11/09/20 1305  ???  [MAR Hold] promethazine (PHENERGAN) tablet 12.5 mg, 12.5 mg, Oral, Q6H PRN, Wilford Sports, MD, 12.5 mg at 11/10/20 0327  ???  [MAR Hold] senna (SENOKOT) tablet 2 tablet, 2  tablet, Oral, Daily before lunch, Wilford Sports, MD  ???  [facility] Hold] sertraline (ZOLOFT) tablet 200 mg, 200 mg, Oral, Daily, Wilford Sports, MD  ???  [MAR Hold] SMOG ENEMA, 240 mL, Rectal, Daily PRN, Wilford Sports, MD  ???  sodium chloride (NS) 0.9 % infusion, 75 mL/hr, Intravenous, Continuous, Wilford Sports, MD, Stopped at 11/10/20 0603  ???  vancomycin (VANCOCIN) 2000 mg IVPB in 500 mL sodium chloride 0.9% (premix), 2,000 mg, Intravenous, For OR use, Eldad Hermina Barters, MD    Facility-Administered Medications Ordered in Other Encounters:   ???  dexamethasone (DECADRON) 4 mg/mL injection, , Intravenous, PRN (once a day), Alona Bene, CRNA, 8 mg at 11/10/20 0753  ???  ROCuronium (ZEMURON) injection, , Intravenous, PRN (once a day), Alona Bene, CRNA, 50 mg at 11/10/20 0753    Allergies  Amoxicillin, Meloxicam, and Shellfish containing products    Family History   Problem Relation Age of Onset   ??? Skin cancer Mother    ??? Breast cancer Neg Hx    ??? Colon cancer Neg Hx    ??? Endometrial cancer Neg Hx    ??? Ovarian cancer Neg Hx        Social History  Social History     Tobacco Use   ??? Smoking status: Never Smoker   ??? Smokeless tobacco: Never Used   ??? Tobacco comment: lives in a home with smokers   Vaping Use   ??? Vaping Use: Never used   Substance Use Topics   ??? Alcohol use: Not Currently   ??? Drug use: Never        Physical Exam     VITAL SIGNS:      Vitals:    11/09/20 2117 11/10/20 0005 11/10/20 0304 11/10/20 0636   BP:  127/57 145/71 137/77   Pulse:  78 87 86   Resp:  23 20 15    Temp:  36.9 ??C (98.4 ??F) 36.7 ??C (98.1 ??F) 37 ??C (98.6 ??F)   TempSrc:  Oral Oral Temporal   SpO2:  97% 97% 97%   Weight: (!) 131.8 kg (290 lb 9.1 oz)      Height: 167.6 cm (5' 6)          Constitutional: Alert and oriented. No acute distress but appears anxious.  Eyes: Conjunctivae are normal.  HEENT: Normocephalic and atraumatic. Conjunctivae clear. No congestion. Moist mucous membranes.   Cardiovascular: Normal rate, regular rhythm. Normal and symmetric distal pulses. Brisk capillary refill. Normal skin turgor.  Respiratory: Normal respiratory effort. Breath sounds are normal. There are no wheezing or crackles heard.  Gastrointestinal: Soft, non-distended, non-tender.  Genitourinary: Deferred.  Musculoskeletal: Non-tender with normal range of motion in all extremities.       Right lower leg: No tenderness or edema.       Left lower leg: No tenderness or edema.  Neurologic: Normal speech and language. No gross focal neurologic deficits are appreciated. Patient is moving all extremities equally, face is symmetric at rest and with speech.  Skin: Skin is warm, dry and intact. No rash noted.  Psychiatric: Mood and affect are normal. Speech and behavior are normal.     Radiology     MRI Brain W Wo Contrast   Final Result   Multiple T1 hypointense enhancing lesions with surrounding vasogenic edema, concerning for intracranial metastatic disease and detailed above. Largest of which is in the left frontal lobe measuring 1.8 x 1.5 cm with surrounding vasogenic edema, mild  effacement of the left lateral ventricle and 7 mm rightward midline shift.      No diffusion weighted signal abnormality to suggest acute infarct.           Laboratory Data     Lab Results   Component Value Date    WBC 10.1 11/10/2020    HGB 10.1 (L) 11/10/2020    HCT 30.7 (L) 11/10/2020    PLT 588 (H) 11/10/2020       Lab Results   Component Value Date    NA 137 11/10/2020    K 3.6 11/10/2020    CL 103 11/10/2020    CO2 25.0 11/10/2020    BUN 5 (L) 11/10/2020    CREATININE 0.36 (L) 11/10/2020    GLU 150 (H) 11/10/2020    CALCIUM 8.8 11/10/2020    MG 2.0 11/09/2020       Lab Results   Component Value Date    BILITOT 0.3 11/09/2020    BILIDIR <0.10 10/21/2018    PROT 7.4 11/09/2020    ALBUMIN 3.1 (L) 11/09/2020    ALT <7 (L) 11/09/2020    AST 8 11/09/2020    ALKPHOS 164 (H) 11/09/2020    GGT 23 07/15/2020       Lab Results   Component Value Date    INR 1.25 11/10/2020    APTT 25.0 (L) 11/10/2020       Pertinent labs & imaging results that were available during my care of the patient were reviewed by me and considered in my medical decision making (see chart for details).    Portions of this record have been created using Scientist, clinical (histocompatibility and immunogenetics). Dictation errors have been sought, but may not have been identified and corrected.       Raechel Ache, MD  Resident  11/10/20 [phone]

## 2020-11-09 NOTE — Unmapped (Signed)
Bed: 10-P  Expected date:   Expected time:   Means of arrival:   Comments:  17

## 2020-11-09 NOTE — Unmapped (Cosign Needed)
NEUROSURGERY CONSULT NOTE    Requesting Attending Physician:  Marcelyn Bruins, MD  Service Requesting Consult:  Emergency Medicine    Assessment and Recommendations  Sheri [name] is a 25 y.o. female with PMH of L breast cancer ER+ (5%), PR-, HER2-, metastatic to lung, hypertrophic osteoarthropathy, and PTSD who presents to the ED with 1 week of HA, nausea and vomiting and new brain metastasis on brain MRI. Patient has extensive metastatic disease in the cerebellum, temporal lobes, brainstem and bilateral frontal lobes. Largest lesion measures roughly 2 cm with significant surrounding vasogenic edema. Given the number of lesions and their size do not feel patient would benefit from surgical intervention at this time. Largest lesion only measures 1cm. Believe symptoms are largely caused by vasogenic edema. Patient would benefit from radiation therapy. Recommend radiation oncology consult.     - No acute neurosurgical intervention being offered at this time  - Recommend radiation oncology consult for radiation planning  - Patient would benefit from steroid course given vasogenic edema. Will defer to oncology vs radiation oncology for management of steroids.   - Neurosurgery service will sign off at this time.    Patient was discussed with Tereasa Coop, MD and staffed with Deb A. Bhowmick, MD. Please page the Neurosurgery consult pager at [phone] with questions/concerns.     Problem List  Active Problems:    * No active hospital problems. *  Resolved Problems:    * No resolved hospital problems. *      History of Present Illness  Sheri [name] is a 25 y.o.  female seen in consultation at the request of Marcelyn Bruins, MD for evaluation brain metastasis. Patient started experiencing HA, nausea and vomiting Tuesday night which gradually worsened. Patient went to OSH where MRI was obtained and patient was found to have multiple lesions.    Review of Systems  A 10-system review of systems was conducted and was negative except as documented above in the HPI.  ______________________________________________________________________    Physical Exam  General: No acute distress; There is no height or weight on file to calculate BMI.    Neurological Exam:  Eyes open spontaneously  Oriented to name, location, time  Pupils equal and reactive bilaterally  Extraocular movements intact bilaterally  Face symmetric  Tongue protrudes in the midline  Motor strength 5/5 x 4  No pronator drift    Cardiovascular:  Warm and well perfused with good pulses, no edema  Pulmonary:  Unlabored breathing, no pursed lips or wheezing, acyanotic  Abdomen:  Soft, nontender, nondistended    Neurological imaging reviewed  MRI brain: extensive metastatic disease in the cerebellum, temporal lobes, brainstem and bilateral frontal lobes. Largest lesion measures roughly 2 cm with significant surrounding vasogenic edema.    The patient's available vitals, intake/output, medications, labs, and relevant neurological imaging were independently reviewed.    ---Historical data---    Allergies  Amoxicillin, Meloxicam, and Shellfish containing products    Medications  Reviewed in Epic.  Medications relevant to this consult listed below.      Past Medical History  Past Medical History:   Diagnosis Date   ??? Anxiety    ??? Depression    ??? Malignant neoplasm of upper-outer quadrant of left breast in female, estrogen receptor negative (CMS-HCC) 12/10/2017   ??? Panic disorder    ??? PTSD (post-traumatic stress disorder)        Past Surgical History  Past Surgical History:   Procedure Laterality Date   ??? AUGMENTATION MAMMAPLASTY Left  08/2018    left only    ??? BREAST BIOPSY Left 2019    malignant   ??? CHEMOTHERAPY Left 11/2017    May 13 2018   ??? IR INSERT PORT AGE GREATER THAN 5 YRS  12/18/2017    IR INSERT PORT AGE GREATER THAN 5 YRS 12/18/2017 [name], MD IMG VIR [facility]   ??? MASTECTOMY Left 05/2018   ??? PR BRONCHOSCOPY,BIOPSY Right 10/23/2019    Procedure: Bronchoscopy, Rigid/Flexible, Include Fluoro Guide When Performed; W/Bronch/Endobronch Bx, Single/Mult Site;  Surgeon: Mercy Moore, MD;  Location: MAIN OR [facility];  Service: Pulmonary   ??? PR BRONCHOSCOPY,TRANSBRON ASPIR BX Right 10/23/2019    Procedure: Bronchoscopy, Rigid/Flex, Incl Fluoro; W/Transbronch Ndl Aspirat Bx, Trachea, Main Stem &/Or Lobar Bronchus;  Surgeon: Mercy Moore, MD;  Location: MAIN OR [facility];  Service: Pulmonary   ??? PR BX/REMV,LYMPH NODE,DEEP AXILL Left 05/30/2018    Procedure: BX/EXC LYMPH NODE; OPEN, DEEP AXILRY NODE;  Surgeon: Talbert Cage, DO;  Location: ASC OR [facility];  Service: Surgical Oncology Breast   ??? PR EXC TUMOR SOFT TISSUE NECK/ANT THORAX SUBQ 3+CM Right 10/23/2018    Procedure: Priority EXCISION, TUMOR, SOFT TISSUE OF NECK OR ANTERIOR THORAX, SUBCUTANEOUS; 3 CM OR GREATER;  Surgeon: Talbert Cage, DO;  Location: ASC OR [facility];  Service: Surgical Oncology Breast   ??? PR IMPLNT BIO IMPLNT FOR SOFT TISSUE REINFORCEMENT Left 05/30/2018    Procedure: IMPLANTATION BIOLOGIC IMPLANT(EG, ACELLULAR DERMAL MATRIX) FOR SOFT TISSUE REINFORCEMENT(EG, BREAST, TRUNK);  Surgeon: Arsenio Katz, MD;  Location: ASC OR [facility];  Service: Plastics   ??? PR INSERTION BREAST IMPLANT SAME DAY OF MASTECTOMY Left 05/30/2018    Procedure: IMMED INSRT BREAST PROSTH AFTER MASTOPEX/MASTECT;  Surgeon: Arsenio Katz, MD;  Location: ASC OR [facility];  Service: Plastics   ??? PR INTRAOPERATIVE SENTINEL LYMPH NODE ID W DYE INJECTION Left 05/30/2018    Procedure: INTRAOPERATIVE IDENTIFICATION SENTINEL LYMPH NODE(S) INCLUDE INJECTION NON-RADIOACTIVE DYE, WHEN PERFORMED;  Surgeon: Talbert Cage, DO;  Location: ASC OR [facility];  Service: Surgical Oncology Breast   ??? PR MASTECTOMY, SIMPLE, COMPLETE Left 05/30/2018    Procedure: Urgent MASTECTOMY, SIMPLE, COMPLETE;  Surgeon: Talbert Cage, DO;  Location: ASC OR [facility];  Service: Surgical Oncology Breast   ??? PR RECMPL WND TRUNK 2.6-7.5 CM Left 10/23/2018    Procedure: REPR COMPLX TRUNK; 2.6 CM TO 7.5 CM;  Surgeon: Arsenio Katz, MD;  Location: ASC OR [facility];  Service: Plastics   ??? PR REPLACEMENT TISSUE EXPANDER W/PERMANENT IMPLANT Left 09/24/2018    Procedure: REPLACEMENT OF TISSUE EXPANDER WITH PERMANENT PROSTHESIS;  Surgeon: Arsenio Katz, MD;  Location: MAIN OR [facility];  Service: Plastics   ??? PR REVISION OF RECONSTRUCTED BREAST Left 09/24/2018    Procedure: R21 - REVISION OF RECONSTRUCTED  BREAST;  Surgeon: Arsenio Katz, MD;  Location: MAIN OR [facility];  Service: Plastics   ??? PR SUSPENSION OF BREAST Right 09/24/2018    Procedure: MASTOPEXY FOR SYMMETRY PURPOSES;  Surgeon: Arsenio Katz, MD;  Location: MAIN OR [facility];  Service: Plastics   ??? PR TISSUE EXPANDER PLACEMENT BREAST RECONSTRUCTION Left 05/30/2018    Procedure: BREAST RECON IMMED/DELAY W/EXPANDR W/SUBSQT EXPA;  Surgeon: Arsenio Katz, MD;  Location: ASC OR [facility];  Service: Plastics       Family History  Family History   Problem Relation Age of Onset   ??? Skin cancer Mother    ??? Breast cancer Neg Hx    ??? Colon cancer  Neg Hx    ??? Endometrial cancer Neg Hx    ??? Ovarian cancer Neg Hx        Social History  Social History     Tobacco Use   ??? Smoking status: Never Smoker   ??? Smokeless tobacco: Never Used   ??? Tobacco comment: lives in a home with smokers   Vaping Use   ??? Vaping Use: Never used   Substance Use Topics   ??? Alcohol use: Not Currently   ??? Drug use: Never

## 2020-11-09 NOTE — Unmapped (Signed)
Oncology Consult Note    Requesting Attending Physician :  Odie Sera, MD  Service Requesting Consult : Neurosurgery [facility])  Reason for Consult: Metastatic breast cancer - new brain mets  Primary Oncologist: Dr. Marc Morgans    Assessment: Sheri [name] is a 25 y.o. female with IDC of Left Breast (BRCA1, ER+, PR-, HER2-) with known metastasis (lung, bone) on adjuvant capecitabine who was admitted for headaches and outpatient head imaging highly c/f metastatic disease. Oncology was consulted for continuity of care.     She presents with refractory headache and blurred vision for the past 1-2 weeks. Outpatient CT imaging showing large frontal lesion with midline shift. MRI brain in ED showed multiple T1 hypo enhancing lesions with vasogenic edema with prominent 1.8x1.5cm lesion in left frontal lobe with 7mm midline shift. She has been started on dexamethasone with improvement in symptoms    On evaluation this AM she has reported significant decrease in headaches with addition of corticosteroids. Mental status appears at baseline. She does not have any focal sensory or motor deficits.     After further discussion between radiation oncology and neurosurgery there is a tentative plan for mass resection tomorrow 9/15. Medical oncology will continue to follow along.     Recommendations:   - Agree with admission to NSGY with outlined procedural plan  - Cont Dex 4g q6h taper tbd nsgy following procedure  - Holding home capecitabine   - Please consult palliative care (specify AYA patient in comments)  - Restaging CTc/a/p and NM bone scan as able while admitted  - We will continue to follow along    This patient has been staffed with Dr. Nedra Hai These recommendations were discussed with the primary team.     Please contact the oncology consult fellow at [phone] with any further questions.    Malva Cogan, MD  PGY-4, Hematology/Oncology Fellow  [facility]  Pager: [phone]    -------------------------------------------------------------    HPI: Sheri [name] is a 25 y.o. female and who is being seen at the request of Odie Sera, MD for evaluation of brain metastasis.     Mrs. Bohman has been having ongoing headaches for the past 2+ weeks along with mild visual changes/blurred vision over the past week. This prompted outpatient team to order CT head. This showed enhancing mass in left frontal lobe with significant vasogenic edema c/f metastatic disease. Directed to present to ED after discussion with Rad Onc team.     Review of Systems: All positive and pertinent negatives are noted in the HPI; a 10 system review of systems was otherwise negative except as noted in HPI.    Oncologic History:  Oncology History Overview Note   Left breast cT2 N0 IDC, G3, weakly ER+, PR-, HER-     Malignant neoplasm of upper-outer quadrant of left breast in female, estrogen receptor negative (CMS-HCC)   12/03/2017 -  Presenting Symptoms    Presented with self detected left breast mass x 3 weeks.  MMG/US: Left breast irregular 2.7 x 3.2 cm mass. There are faint microcalcifications associated with the mass and mild surrounding trabecular thickening is suggestive of edema.  There are 2 hyperdense and enlarged left axillary lymph nodes. No other suspicious findings are seen in the left breast.     12/03/2017 -  Other    Clinic exam: Left breast in the 1 to 2 o'clock position there is a 4 cm irregular mass. Subtle pink erythema without pitting. Left axilla with  a palpable 2 cm lymph node.     12/03/2017 Biopsy    Left breast USG core biopsy: IDC, G3, ER+(5%), PR-(0), HER2-(0)  Left axillary USG core biopsy: fragments of LN negative for carcinoma     12/10/2017 Initial Diagnosis    Malignant neoplasm of upper-outer quadrant of left breast in female, estrogen receptor negative (CMS-HCC)     12/10/2017 Tumor Board    MDC recs: Left breast cT2 N0 IDC, G3, +/-/-. Very suspicious LN on imaging and exam. LN core biopsy negative. Discussed considering repeat core (although needle is definitively in the node) vs outback SLN. Will receive NACT. If node neg, plan for outback SLN. Node positivity would affect radiation recs if patient has mastectomy. Meeting genetics today. Will discuss fertility. Also discussed concerns for XRT in young patient in 20's - higher risk of complications from XRT long-term. Consider staging studies.     12/10/2017 -  Cancer Staged    Staging form: Breast, AJCC 8th Edition  - Clinical stage from 12/10/2017: Stage IIB (cT2, cN0(f), cM0, G3, ER+, PR-, HER2-) - Signed by Talbert Cage, DO on 12/10/2017       12/11/2017 Biopsy    Repeat left axillary core biopsy given clinical suspicion: LN negative for carcinoma     12/24/2017 - 02/17/2018 Chemotherapy    OP BREAST AC (DOSE DENSE)  DOXOrubicin 60 mg/m2, Cyclophosphamide 600 mg/m2 every 14 days     01/08/2018 Genetics    Patient has genetic testing done for breast cancer.  Negative [employer] Breast Cancer Guidelines-Based Panel.     02/25/2018 - 05/19/2018 Chemotherapy    OP BREAST PACLItaxel WEEKLY / CARBOplatin EVERY 3 WEEKS  PACLItaxel 80 mg/m2 IV weekly   CARBOplatin AUC 6 IV every 3 weeks   21-day cycle x 4 cycles      05/30/2018 Surgery    Left SSM: Residual IDC, Gr 2 +/-/- measuring 8mm.  Final margins are clear.  0/4 SLN with metastatic disease  Residual Cancer Burden   Tumor bed size: 8 mm x 7 mm  Overall tumor cellularity: 40%  Percentage in situ: 0%  Number of involved lymph nodes: 0  Diameter of largest metastasis: 0 mm  Residual Cancer Burden Score: 1.687  Residual Cancer Burden Class: RCB-II     06/09/2018 -  Cancer Staged    Staging form: Breast, AJCC 8th Edition  - Pathologic stage from 06/09/2018: No Stage Recommended (ypT1b, pN0(sn), cM0, G2, ER+, PR-, HER2-) - Signed by Rudie Meyer, ANP on 06/09/2018       10/23/2018 Surgery    Left chest wall excision by Dr. Dellis Anes. Final Path: IDC Grade 3 ER-PR- HER2- measuring 1 cm in greatest dimension. Final Margins clear.      11/12/2018 -  Cancer Staged    Staging form: Breast, AJCC 8th Edition  - Pathologic stage from 11/12/2018: Stage IB (rpT1b, pN0, cM0, G3, ER-, PR-, HER2-) - Signed by Talbert Cage, DO on 11/13/2018       11/10/2019 - 05/03/2020 Chemotherapy    OP BREAST GEMCITABINE (2 OF 3 WEEKS)  gemcitabine 1,000 mg/m2 IV on days 1, 8, every 21 days     08/10/2020 -  Cancer Staged    Staging form: Breast, AJCC 8th Edition  - Pathologic: Stage IV (pM1) - Signed by Donzetta Kohut, PA on 08/10/2020       09/15/2020 - 09/15/2020 Chemotherapy    OP BREAST SACITUZUMAB GOVITECAN-HZIY  sacituzumab govitecan-hziy 10 mg/kg IV on days 1,  8, every 21 days       TEMPUS results show  * BRCA1 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   * TP53  * STK11  * CREBBP  * CKS1B  * MCL1      Past Medical History:   Diagnosis Date   ??? Anxiety    ??? Depression    ??? Malignant neoplasm of upper-outer quadrant of left breast in female, estrogen receptor negative (CMS-HCC) 12/10/2017   ??? Panic disorder    ??? PTSD (post-traumatic stress disorder)        Past Surgical History:   Procedure Laterality Date   ??? AUGMENTATION MAMMAPLASTY Left 08/2018    left only    ??? BREAST BIOPSY Left 2019    malignant   ??? CHEMOTHERAPY Left 11/2017    May 13 2018   ??? IR INSERT PORT AGE GREATER THAN 5 YRS  12/18/2017    IR INSERT PORT AGE GREATER THAN 5 YRS 12/18/2017 [name], MD IMG VIR [facility]   ??? MASTECTOMY Left 05/2018   ??? PR BRONCHOSCOPY,BIOPSY Right 10/23/2019    Procedure: Bronchoscopy, Rigid/Flexible, Include Fluoro Guide When Performed; W/Bronch/Endobronch Bx, Single/Mult Site;  Surgeon: Mercy Moore, MD;  Location: MAIN OR [facility];  Service: Pulmonary   ??? PR BRONCHOSCOPY,TRANSBRON ASPIR BX Right 10/23/2019    Procedure: Bronchoscopy, Rigid/Flex, Incl Fluoro; W/Transbronch Ndl Aspirat Bx, Trachea, Main Stem &/Or Lobar Bronchus;  Surgeon: Mercy Moore, MD;  Location[facility] MAIN OR [facility];  Service: Pulmonary   ??? PR BX/REMV,LYMPH NODE,DEEP AXILL Left 05/30/2018    Procedure: BX/EXC LYMPH NODE; OPEN, DEEP AXILRY NODE;  Surgeon: Talbert Cage, DO;  Location: ASC OR [facility];  Service: Surgical Oncology Breast   ??? PR EXC TUMOR SOFT TISSUE NECK/ANT THORAX SUBQ 3+CM Right 10/23/2018    Procedure: Priority EXCISION, TUMOR, SOFT TISSUE OF NECK OR ANTERIOR THORAX, SUBCUTANEOUS; 3 CM OR GREATER;  Surgeon: Talbert Cage, DO;  Location: ASC OR [facility];  Service: Surgical Oncology Breast   ??? PR IMPLNT BIO IMPLNT FOR SOFT TISSUE REINFORCEMENT Left 05/30/2018    Procedure: IMPLANTATION BIOLOGIC IMPLANT(EG, ACELLULAR DERMAL MATRIX) FOR SOFT TISSUE REINFORCEMENT(EG, BREAST, TRUNK);  Surgeon: Arsenio Katz, MD;  Location: ASC OR [facility];  Service: Plastics   ??? PR INSERTION BREAST IMPLANT SAME DAY OF MASTECTOMY Left 05/30/2018    Procedure: IMMED INSRT BREAST PROSTH AFTER MASTOPEX/MASTECT;  Surgeon: Arsenio Katz, MD;  Location: ASC OR [facility];  Service: Plastics   ??? PR INTRAOPERATIVE SENTINEL LYMPH NODE ID W DYE INJECTION Left 05/30/2018    Procedure: INTRAOPERATIVE IDENTIFICATION SENTINEL LYMPH NODE(S) INCLUDE INJECTION NON-RADIOACTIVE DYE, WHEN PERFORMED;  Surgeon: Talbert Cage, DO;  Location: ASC OR [facility];  Service: Surgical Oncology Breast   ??? PR MASTECTOMY, SIMPLE, COMPLETE Left 05/30/2018    Procedure: Urgent MASTECTOMY, SIMPLE, COMPLETE;  Surgeon: Talbert Cage, DO;  Location: ASC OR [facility];  Service: Surgical Oncology Breast   ??? PR RECMPL WND TRUNK 2.6-7.5 CM Left 10/23/2018    Procedure: REPR COMPLX TRUNK; 2.6 CM TO 7.5 CM;  Surgeon: Arsenio Katz, MD;  Location: ASC OR [facility];  Service: Plastics   ??? PR REPLACEMENT TISSUE EXPANDER W/PERMANENT IMPLANT Left 09/24/2018    Procedure: REPLACEMENT OF TISSUE EXPANDER WITH PERMANENT PROSTHESIS;  Surgeon: Arsenio Katz, MD;  Location: MAIN OR [facility];  Service: Plastics   ??? PR REVISION OF RECONSTRUCTED BREAST Left 09/24/2018    Procedure: R21 - REVISION OF RECONSTRUCTED  BREAST;  Surgeon: Arsenio Katz, MD;  Location: MAIN  OR [facility];  Service: Plastics   ??? PR SUSPENSION OF BREAST Right 09/24/2018    Procedure: MASTOPEXY FOR SYMMETRY PURPOSES;  Surgeon: Arsenio Katz, MD;  Location: MAIN OR [facility];  Service: Plastics   ??? PR TISSUE EXPANDER PLACEMENT BREAST RECONSTRUCTION Left 05/30/2018    Procedure: BREAST RECON IMMED/DELAY W/EXPANDR W/SUBSQT EXPA;  Surgeon: Arsenio Katz, MD;  Location: ASC OR [facility];  Service: Plastics       Family History   Problem Relation Age of Onset   ??? Skin cancer Mother    ??? Breast cancer Neg Hx    ??? Colon cancer Neg Hx    ??? Endometrial cancer Neg Hx    ??? Ovarian cancer Neg Hx    As above     Social History     Socioeconomic History   ??? Marital status: Single   Tobacco Use   ??? Smoking status: Never Smoker   ??? Smokeless tobacco: Never Used   ??? Tobacco comment: lives in a home with smokers   Vaping Use   ??? Vaping Use: Never used   Substance and Sexual Activity   ??? Alcohol use: Not Currently   ??? Drug use: Never   Social History Narrative    The patient is single.  She lives at home with her mother and brother       Social History     Social History Narrative    The patient is single.  She lives at home with her mother and brother       Allergies: is allergic to amoxicillin, meloxicam, and shellfish containing products.    Medications:   Meds:  ??? dexamethasone  10 mg Intravenous Q6H   ??? docusate sodium  100 mg Oral BID   ??? ergocalciferol-1,250 mcg (50,000 unit)  1,250 mcg Oral Weekly   ??? famotidine  20 mg Oral BID   ??? ferrous sulfate  325 mg Oral Daily   ??? melatonin  3 mg Oral QPM   ??? pantoprazole  40 mg Oral Daily   ??? polyethylene glycol  17 g Oral Daily   ??? potassium chloride  40 mEq Oral Once   ??? senna  2 tablet Oral Daily before lunch   ??? sertraline  200 mg Oral Daily     Continuous Infusions:  ??? [START ON 11/10/2020] sodium chloride       PRN Meds:.acetaminophen, acetaminophen, albuterol, bisacodyL, calcium carbonate **AND** simethicone, hydrALAZINE, labetaloL, ondansetron, oxyCODONE **OR** oxyCODONE, SMOG    Objective:   Vitals: Temp:  [36.5 ??C (97.7 ??F)-36.7 ??C (98.1 ??F)] 36.7 ??C (98.1 ??F)  Heart Rate:  [115-120] 120  SpO2 Pulse:  [97] 97  Resp:  [18-20] 20  BP: (144-157)/(70-94) 157/73  MAP (mmHg):  [90-98] 98  SpO2:  [94 %-97 %] 95 %  No intake/output data recorded.    Physical Exam:  BP 157/73  - Pulse 120  - Temp 36.7 ??C (98.1 ??F) (Oral)  - Resp 20  - SpO2 95%    GEN: well developed, well nourished, in no distress  PSYCH: Mood and affect somewhat incongruent with current situation   HEENT: NCAT, pupils equal, sclerae anicteric  LYMPH: deferred  PULM: normal WOB  COR: no LE edema  GI: abdomen not distended  SKIN: No rashes or ecchymoses, facial flushing      ECOG Performance Status: 0 - Fully active, able to carry on all pre-disease performance without restriction    Test Results  Lab Results   Component Value  Date    WBC 7.0 11/09/2020    HGB 10.1 (L) 11/09/2020    HCT 30.8 (L) 11/09/2020    PLT 541 (H) 11/09/2020     Lab Results   Component Value Date    NA 136 11/09/2020    K 2.7 (L) 11/09/2020    CL 103 11/09/2020    CO2 26.0 11/09/2020    BUN 6 (L) 11/09/2020    CREATININE 0.41 (L) 11/09/2020    GLU 96 11/09/2020    CALCIUM 8.4 (L) 11/09/2020     Lab Results   Component Value Date    BILITOT 0.3 11/09/2020    BILIDIR <0.10 10/21/2018    PROT 7.4 11/09/2020    ALBUMIN 3.1 (L) 11/09/2020    ALT <7 (L) 11/09/2020    AST 8 11/09/2020    ALKPHOS 164 (H) 11/09/2020    GGT 23 07/15/2020     No results found for: LABPROT, INR, APTT    Imaging: Radiology studies were personally reviewed

## 2020-11-09 NOTE — Unmapped (Incomplete)
Radiation Oncology Brief Inpatient Consult Note     Encounter Date: 11/08/2020  Patient Name: Sheri [name]  Patient Age: 25 y.o.  Patient DOB: 05-10-1995    Chief Complaint: metastatic TNBC with new brain metastases    Assessment  25 y.o. female with metastatic TNBC s/p NACH, mastectomy, and adjuvant radiation in 2020, diagnosed with lung metastases in 2021, who recently presented to the ED with new symptomatic brian metastases.    Recommendations:  - Adjuvant radiation after neurosurgical intervention  - Follow up in clinic 2 weeks after surgery, appointment has been requested    For questions regarding this patient while they are still an inpatient, please page Matt Kathrina Crosley between 8AM-5PM on business days.  After hours or on the weekend please page the after hours resident on call [phone]).    This patient's attending physician is Dr. Mina Marble.    Physical Exam:   General: 25 y.o. female, cooperative, no distress, appears stated age  Head: atraumatic  Eyes: Conjunctivae/corneas clear. PERRL, EOMs intact.  ENT: No gross deformities. Mucous membranes moist. Pharynx non-erythematous.  Neurologic: Alert, oriented x 3. Sensation grossly conserved. CN II-XII intact. 5/5 strength all extremities.  Psychiatric: appropriate affect, recent and remote memory intact      Luiz Iron, MD  Radiation Oncology PGY-2 exam. LN core biopsy negative. Discussed considering repeat core (although needle is definitively in the node) vs outback SLN. Will receive NACT. If node neg, plan for outback SLN. Node positivity would affect radiation recs if patient has mastectomy. Meeting genetics today. Will discuss fertility. Also discussed concerns for XRT in young patient in 20's - higher risk of complications from XRT long-term. Consider staging studies.     12/10/2017 -  Cancer Staged    Staging form: Breast, AJCC 8th Edition  - Clinical stage from 12/10/2017: Stage IIB (cT2, cN0(f), cM0, G3, ER+, PR-, HER2-) - Signed by Talbert Cage, DO on 12/10/2017       12/11/2017 Biopsy    Repeat left axillary core biopsy given clinical suspicion: LN negative for carcinoma     12/24/2017 - 02/17/2018 Chemotherapy    OP BREAST AC (DOSE DENSE)  DOXOrubicin 60 mg/m2, Cyclophosphamide 600 mg/m2 every 14 days     01/08/2018 Genetics    Patient has genetic testing done for breast cancer.  Negative [employer] Breast Cancer Guidelines-Based Panel.     02/25/2018 - 05/19/2018 Chemotherapy    OP BREAST PACLItaxel WEEKLY / CARBOplatin EVERY 3 WEEKS  PACLItaxel 80 mg/m2 IV weekly   CARBOplatin AUC 6 IV every 3 weeks   21-day cycle x 4 cycles      05/30/2018 Surgery    Left SSM: Residual IDC, Gr 2 +/-/- measuring 8mm.  Final margins are clear.  0/4 SLN with metastatic disease  Residual Cancer Burden   Tumor bed size: 8 mm x 7 mm  Overall tumor cellularity: 40%  Percentage in situ: 0%  Number of involved lymph nodes: 0  Diameter of largest metastasis: 0 mm  Residual Cancer Burden Score: 1.687  Residual Cancer Burden Class: RCB-II     06/09/2018 -  Cancer Staged    Staging form: Breast, AJCC 8th Edition  - Pathologic stage from 06/09/2018: No Stage Recommended (ypT1b, pN0(sn), cM0, G2, ER+, PR-, HER2-) - Signed by Rudie Meyer, ANP on 06/09/2018       10/23/2018 Surgery    Left chest wall excision by Dr. Dellis Anes. Final Path: IDC Grade 3 ER-PR- HER2- measuring 1 cm in greatest dimension.  Final Margins clear.      11/12/2018 -  Cancer Staged    Staging form: Breast, AJCC 8th Edition  - Pathologic stage from 11/12/2018: Stage IB (rpT1b, pN0, cM0, G3, ER-, PR-, HER2-) - Signed by Talbert Cage, DO on 11/13/2018       11/10/2019 - 05/03/2020 Chemotherapy    OP BREAST GEMCITABINE (2 OF 3 WEEKS)  gemcitabine 1,000 mg/m2 IV on days 1, 8, every 21 days     08/10/2020 -  Cancer Staged    Staging form: Breast, AJCC 8th Edition  - Pathologic: Stage IV (pM1) - Signed by Donzetta Kohut, PA on 08/10/2020       09/15/2020 - 09/15/2020 Chemotherapy    OP BREAST SACITUZUMAB GOVITECAN-HZIY  sacituzumab govitecan-hziy 10 mg/kg IV on days 1, 8, every 21 days         Prior Radiation Therapy:  {YES/NO:21013}    Pacemaker:  {YES/NO:21013}    Pregnancy status:  {PREG FINDINGS:25555}  Autoimmune Disease:  {YES/NO:21013}      Past Medical History:   Diagnosis Date   ??? Anxiety    ??? Depression    ??? Malignant neoplasm of upper-outer quadrant of left breast in female, estrogen receptor negative (CMS-HCC) 12/10/2017   ??? Panic disorder    ??? PTSD (post-traumatic stress disorder)         Past Surgical History:  Past Surgical History:   Procedure Laterality Date   ??? AUGMENTATION MAMMAPLASTY Left 08/2018    left only    ??? BREAST BIOPSY Left 2019    malignant   ??? CHEMOTHERAPY Left 11/2017    May 13 2018   ??? IR INSERT PORT AGE GREATER THAN 5 YRS  12/18/2017    IR INSERT PORT AGE GREATER THAN 5 YRS 12/18/2017 [name], MD IMG VIR [facility]   ??? MASTECTOMY Left 05/2018   ??? PR BRONCHOSCOPY,BIOPSY Right 10/23/2019    Procedure: Bronchoscopy, Rigid/Flexible, Include Fluoro Guide When Performed; W/Bronch/Endobronch Bx, Single/Mult Site;  Surgeon: Mercy Moore, MD;  Location: MAIN OR [facility];  Service: Pulmonary   ??? PR BRONCHOSCOPY,TRANSBRON ASPIR BX Right 10/23/2019    Procedure: Bronchoscopy, Rigid/Flex, Incl Fluoro; W/Transbronch Ndl Aspirat Bx, Trachea, Main Stem &/Or Lobar Bronchus; Surgeon: Mercy Moore, MD;  Location: MAIN OR [facility];  Service: Pulmonary   ??? PR BX/REMV,LYMPH NODE,DEEP AXILL Left 05/30/2018    Procedure: BX/EXC LYMPH NODE; OPEN, DEEP AXILRY NODE;  Surgeon: Talbert Cage, DO;  Location: ASC OR [facility];  Service: Surgical Oncology Breast   ??? PR EXC TUMOR SOFT TISSUE NECK/ANT THORAX SUBQ 3+CM Right 10/23/2018    Procedure: Priority EXCISION, TUMOR, SOFT TISSUE OF NECK OR ANTERIOR THORAX, SUBCUTANEOUS; 3 CM OR GREATER;  Surgeon: Talbert Cage, DO;  Location: ASC OR [facility];  Service: Surgical Oncology Breast   ??? PR IMPLNT BIO IMPLNT FOR SOFT TISSUE REINFORCEMENT Left 05/30/2018    Procedure: IMPLANTATION BIOLOGIC IMPLANT(EG, ACELLULAR DERMAL MATRIX) FOR SOFT TISSUE REINFORCEMENT(EG, BREAST, TRUNK);  Surgeon: Arsenio Katz, MD;  Location: ASC OR [facility];  Service: Plastics   ??? PR INSERTION BREAST IMPLANT SAME DAY OF MASTECTOMY Left 05/30/2018    Procedure: IMMED INSRT BREAST PROSTH AFTER MASTOPEX/MASTECT;  Surgeon: Arsenio Katz, MD;  Location: ASC OR [facility];  Service: Plastics   ??? PR INTRAOPERATIVE SENTINEL LYMPH NODE ID W DYE INJECTION Left 05/30/2018    Procedure: INTRAOPERATIVE IDENTIFICATION SENTINEL LYMPH NODE(S) INCLUDE INJECTION NON-RADIOACTIVE DYE, WHEN PERFORMED;  Surgeon: Talbert Cage, DO;  Location: ASC OR  [facility];  Service: Surgical Oncology Breast   ??? PR MASTECTOMY, SIMPLE, COMPLETE Left 05/30/2018    Procedure: Urgent MASTECTOMY, SIMPLE, COMPLETE;  Surgeon: Talbert Cage, DO;  Location: ASC OR [facility];  Service: Surgical Oncology Breast   ??? PR RECMPL WND TRUNK 2.6-7.5 CM Left 10/23/2018    Procedure: REPR COMPLX TRUNK; 2.6 CM TO 7.5 CM;  Surgeon: Arsenio Katz, MD;  Location: ASC OR [facility];  Service: Plastics   ??? PR REPLACEMENT TISSUE EXPANDER W/PERMANENT IMPLANT Left 09/24/2018    Procedure: REPLACEMENT OF TISSUE EXPANDER WITH PERMANENT PROSTHESIS;  Surgeon: Arsenio Katz, MD;  Location: MAIN OR [facility];  Service: Plastics ??? PR REVISION OF RECONSTRUCTED BREAST Left 09/24/2018    Procedure: R21 - REVISION OF RECONSTRUCTED  BREAST;  Surgeon: Arsenio Katz, MD;  Location: MAIN OR [facility];  Service: Plastics   ??? PR SUSPENSION OF BREAST Right 09/24/2018    Procedure: MASTOPEXY FOR SYMMETRY PURPOSES;  Surgeon: Arsenio Katz, MD;  Location: MAIN OR [facility];  Service: Plastics   ??? PR TISSUE EXPANDER PLACEMENT BREAST RECONSTRUCTION Left 05/30/2018    Procedure: BREAST RECON IMMED/DELAY W/EXPANDR W/SUBSQT EXPA;  Surgeon: Arsenio Katz, MD;  Location: ASC OR [facility];  Service: Plastics        Family History:  Cancer-related family history includes Skin cancer in her mother. There is no history of Breast cancer, Colon cancer, or Ovarian cancer.     Social History:  Social History     Socioeconomic History   ??? Marital status: Single   Tobacco Use   ??? Smoking status: Never Smoker   ??? Smokeless tobacco: Never Used   ??? Tobacco comment: lives in a home with smokers   Vaping Use   ??? Vaping Use: Never used   Substance and Sexual Activity   ??? Alcohol use: Not Currently   ??? Drug use: Never   Social History Narrative    The patient is single.  She lives at home with her mother and brother       The patient's address:  [address]  [place] [address]    Medications and Allergies:  is allergic to amoxicillin, meloxicam, and shellfish containing products.  Current Facility-Administered Medications   Medication Dose Route Frequency Provider Last Rate Last Admin   ??? dexamethasone (DECADRON) 4 mg/mL injection 10 mg  10 mg Intravenous Q6H Raechel Ache, MD   10 mg at 11/09/20 9811     Current Outpatient Medications   Medication Sig Dispense Refill   ??? acetaminophen (TYLENOL) 500 MG tablet Take 1,000 mg by mouth every eight (8) hours.     ??? benzonatate (TESSALON PERLES) 100 MG capsule Take 1 capsule (100 mg total) by mouth Three (3) times a day as needed for cough. 60 capsule 1   ??? capecitabine (XELODA) 500 MG tablet Take 4 tablets (2,000 mg total) by mouth Two (2) times a day for 14 days . Followed by 7 days off drug. 112 tablet 0   ??? ergocalciferol-1,250 mcg, 50,000 unit, (VITAMIN D2-1,250 MCG, 50,000 UNIT,) 1,250 mcg (50,000 unit) capsule Take 1 capsule (1,250 mcg total) by mouth once a week. 4 capsule 2   ??? famotidine (PEPCID) 20 MG tablet Take 1 tablet (20 mg total) by mouth Two (2) times a day. 60 tablet 11   ??? ferrous sulfate 325 (65 FE) MG tablet Take 1 tablet (325 mg total) by mouth daily. Take with orange juice. 30 tablet 3   ??? gabapentin (NEURONTIN)  300 MG capsule Take 1 capsule (300 mg total) by mouth nightly. 30 capsule 1   ??? lidocaine-prilocaine (EMLA) 2.5-2.5 % cream Apply topically once as needed for up to 1 dose. Apply small amount to port site 30 minutes - 1 hour prior to access. 30 g 0   ??? loperamide (IMODIUM) 2 mg capsule Take 2 mg by mouth 4 (four) times a day as needed.      ??? methylPREDNISolone (MEDROL DOSEPACK) 4 mg tablet follow package directions 1 each 0   ??? naloxone (NARCAN) 4 mg nasal spray One spray in either nostril once for known/suspected opioid overdose. May repeat every 2-3 minutes in alternating nostril til EMS arrives 2 each 1   ??? naproxen (NAPROSYN) 500 MG tablet Take 1 tablet (500 mg total) by mouth in the morning and 1 tablet (500 mg total) in the evening. Take with meals. 60 tablet 2   ??? ondansetron (ZOFRAN-ODT) 4 MG disintegrating tablet Dissolve 1 tablet (4 mg total) on the tongue every eight (8) hours as needed for nausea for up to 30 doses. 30 tablet 1   ??? oxyCODONE (ROXICODONE) 15 MG immediate release tablet Take 1-2 tablets (15-30mg ) every 4-6 hours as needed for cancer related pain. 140 tablet 0   ??? pantoprazole (PROTONIX) 40 MG tablet Take 1 tablet (40 mg total) by mouth daily. 90 tablet 3   ??? polyethylene glycol (GLYCOLAX) 17 gram/dose powder Mix 17g (1 cap) in 4-8 ounces of extra liquid each day. 255 g 0   ??? prochlorperazine (COMPAZINE) 10 MG tablet Take 1 tablet (10 mg total) by mouth every six (6) hours as needed (nausea). 30 tablet 2   ??? senna (SENNA) 8.6 mg tablet Take 1-2 tablets by mouth two (2) times a day as needed for constipation. 120 tablet 11   ??? sertraline (ZOLOFT) 100 MG tablet Take 2 tablets (200 mg total) by mouth daily. 60 tablet 2        Review of Systems:  A comprehensive review of 10 systems was negative except for pertinent positives noted in HPI.    Physical Exam:   Vitals:    11/09/20 0914   BP: 157/73   Pulse: 120   Resp: 20   Temp:    SpO2: 95%     General: 25 y.o. female, cooperative, no distress, appears stated age  Head: atraumatic  Eyes: Conjunctivae/corneas clear. PERRL, EOMs intact.  ENT: No gross deformities. Mucous membranes moist. Pharynx non-erythematous.  Neck: Supple, symmetrical, trachea midline, thyroid not enlarged.  Back: Symmetric, no curvature, no midline tenderness.  Respiratory: Symmetric chest rise without accessory muscles.  Abdomen: Soft, non-tender. No masses. No organomegaly.  Lymphatics: No cervical, supraclavicular, or axillary adenopathy.  Musculoskeletal: no cyanosis or clubbing. Muscle strength, tone, and range of motion all extremities intact.   Skin: Warm, dry with no rashes or lesions.   Neurologic: Alert, oriented x 3. Sensation grossly conserved. CN II-XII intact.  Psychiatric: appropriate affect, recent and remote memory intact    Imaging, Labs, and Pathology: We have personally reviewed these studies as detailed above.     I have reviewed old/outside medical records (please see above for summary).     Avelina Laine, MD  PGY3 Radiation Oncology

## 2020-11-09 NOTE — Unmapped (Signed)
ED Progress Note  November 09, 2020 10:47 AM  Nsurg plans to resect brain met. They will admit

## 2020-11-09 NOTE — Unmapped (Signed)
Patient presents with report of new brain mets and midline shift. Rad onc sent patient for neurosurg eval and MRI. Patient reports headache.

## 2020-11-09 NOTE — Unmapped (Signed)
[facility] team has been in touch with Ms Hogrefe who is en route to the ED with her mother.     Ms Carmack is understandably very anxious since learning earlier today that she has new brain metastases and needs urgent evaluation.    She would greatly benefit from anti-anxiety medication (recommend Ativan) on arrival to the emergency department and on an as-needed basis. While she has a history of obtaining benzodiazepines outside of the clinical setting, she has not been using such medications, or any benzodiazepines, for the past many months. It is safe and in fact my strong recommendation that she receive such medications as needed considering her history of crippling anxiety related to her cancer diagnosis and sequelae. Adequate anxiety management will allow her to better focus on decision making in the setting of potentially difficult decisions ahead.    She also has a relatively new diagnosis of hypertrophic osteoarthropathy for which oxycodone has been an effective treatment. Please continue her oxycodone 30mg  q4h PRN while in ED and if admitted to the hospital.    [name], MD PhD  [facility] Adult Palliative Care Specialist

## 2020-11-09 NOTE — Unmapped (Signed)
ADVANCE CARE PLANNING NOTE    Discussion Date:  November 09, 2020    Patient has decisional capacity:  YES    Patient has selected a Health Care Decision-Maker if loses capacity: [facility] Decision Maker as of 11/09/2020  Sheri [name] (mother)  Cell: [phone]  Home: [phone]    Discussion Participants:  Sheri [name] (patient)  [name], LCSW ([facility] social worker)    Communication of Medical Status/Prognosis:   Understands severity of new brain metastases. Worries that this is the end based on stories of young patients with similar breast cancers who developed brain metastases shortly before end of life. Working to maintain hope that she may have more time to live.    Communication of Treatment Goals/Options:   [name] shared preferences for end-of-life and for her care and care communications:  ?? Prefers NOT to know or discuss prognosis, particularly specifics of anticipated time  ?? Wants to be given some information about her health but prefers for her parents to receive the majority of details from health care providers. Prefers to be left out of conversations involving prognosis or sharing detail about worrying developments in her health.  ?? Would like for her mother to be her primary HCDM. I shared this with her mother, who stated that she understands and is willing, and anticipates that she and [name] would make decisions together if needed.  ?? [name] is to be free of suffering and to be sedated if appropriate. She states she would like to just be asleep and not know what's going on. She does not wish to sacrifice management of pain, anxiety, etc in order to be more alert now or at end of life. This is consistent with her decision making in the past and reasonable considering longstanding anxiety and PTSD symptoms.  ?? [name] strongly wishes to have both of her parents with her if/when she approaches end of life.            I spent 25 minutes providing voluntary advance care planning services for this patient.

## 2020-11-09 NOTE — Unmapped (Signed)
NEUROSURGERY HISTORY AND PHYSICAL    Today's consult note will serve as H&P with the following changes to plan listed below:      Plan:  - After discussing case with radiation oncology team, we have both agreed that due to the significant amount of edema surrounding her L frontal lesion, we believe surgical resection of the L frontal lesion is indicated to help relieve mass effect as well as to obtain tissue for pathology in order to confirm diagnosis and help guide further treatment.   - We will plan to go to the OR tomorrow (9/15) for resection   - Admit to neurosurgery floor service   - Continue Dex 4q6  - Will preop patient tonight: NPO at midnight, hold all DVT prophylaxis, Pre-op labs (CBC, BMP, Type and Screen, aPTT, PT/INR)

## 2020-11-09 NOTE — Unmapped (Shared)
[facility]  Emergency Department Medical Screening Examination     Subjective     Sheri [name] is a 25 y.o. female presenting for evaluation of Evaluation of Abnormal Diagnostic Test. She is a breast cancer patient who was advised to present to the ED out of concern for potential brain mets. Her oncology team recommends she have a MRI performed.     Abbreviated Review of Systems/Covid Screen  Constitutional: Negative for fever  Respiratory: Negative for cough. Negative for difficulty breathing.    Objective     ED Triage Vitals [11/08/20 2154]   Enc Vitals Group      BP 144/94      Heart Rate 115      SpO2 Pulse       Resp 20      Temp       Temp src       SpO2 97 %     Focused Physical Exam  Constitutional: No acute distress.  Respiratory: Non-labored respirations.  Neurological: Clear speech. No gross focal neurologic deficits are appreciated.  ?  Assessment & Plan     Plan for MRI brain, UA, basic labs, and COVID-19 PCR.     A medical screening exam has been performed. At the time of this evaluation, no emergency medical condition requiring immediate stabilization has been identified nor is there suspicion for imminent decompensation. Appropriate triage protocols will be implemented and a comprehensive ED evaluation with disposition will be completed by a healthcare provider when an appropriate ED location becomes available. The patient is aware that this is an initial encounter only and verbalizes understanding and agreement with the plan.     Emergency Department operations continue to be impacted by the COVID-19 pandemic.    Documentation assistance was provided by Cherly Hensen, Scribe, on November 08, 2020 at 10:02 PM for Dewitt Hoes, MD.    {*** note to provider: please use .[email or web address] to enter a scribe attestation}

## 2020-11-10 LAB — BASIC METABOLIC PANEL
ANION GAP: 9 mmol/L (ref 5–14)
ANION GAP: 7 mmol/L (ref 5–14)
BLOOD UREA NITROGEN: 5 mg/dL — ABNORMAL LOW (ref 9–23)
BLOOD UREA NITROGEN: <5 mg/dL — ABNORMAL LOW (ref 9–23)
BUN / CREAT RATIO: 14
CALCIUM: 8.8 mg/dL (ref 8.7–10.4)
CALCIUM: 8.4 mg/dL — ABNORMAL LOW (ref 8.7–10.4)
CHLORIDE: 103 mmol/L (ref 98–107)
CHLORIDE: 105 mmol/L (ref 98–107)
CO2: 25 mmol/L (ref 20.0–31.0)
CO2: 22 mmol/L (ref 20.0–31.0)
CREATININE: 0.36 mg/dL — ABNORMAL LOW
CREATININE: 0.29 mg/dL — ABNORMAL LOW
EGFR CKD-EPI (2021) FEMALE: >90 mL/min/{1.73_m2} (ref >=60)
EGFR CKD-EPI (2021) FEMALE: >90 mL/min/{1.73_m2} (ref >=60)
GLUCOSE RANDOM: 150 mg/dL — ABNORMAL HIGH (ref 70–99)
GLUCOSE RANDOM: 165 mg/dL — ABNORMAL HIGH (ref 70–99)
POTASSIUM: 3.6 mmol/L (ref 3.4–4.8)
POTASSIUM: 3.6 mmol/L (ref 3.4–4.8)
SODIUM: 137 mmol/L (ref 135–145)
SODIUM: 134 mmol/L — ABNORMAL LOW (ref 135–145)

## 2020-11-10 LAB — URINALYSIS WITH CULTURE REFLEX
BILIRUBIN UA: NEGATIVE
BLOOD UA: NEGATIVE
GLUCOSE UA: NEGATIVE
KETONES UA: NEGATIVE
LEUKOCYTE ESTERASE UA: NEGATIVE
NITRITE UA: NEGATIVE
PH UA: 7.5 (ref 5.0–9.0)
RBC UA: <1 /HPF (ref <=4)
SPECIFIC GRAVITY UA: 1.022 (ref 1.003–1.030)
SQUAMOUS EPITHELIAL: 4 /HPF (ref 0–5)
UROBILINOGEN UA: <2
WBC UA: 1 /HPF (ref 0–5)

## 2020-11-10 LAB — APTT
APTT: 25 s — ABNORMAL LOW (ref 25.1–36.5)
HEPARIN CORRELATION: <0.2

## 2020-11-10 LAB — BLOOD GAS CRITICAL CARE PANEL, ARTERIAL
BASE EXCESS ARTERIAL: -0.3 (ref -2.0–2.0)
CALCIUM IONIZED ARTERIAL (MG/DL): 4.43 mg/dL (ref 4.40–5.40)
FIO2 ARTERIAL: 100
GLUCOSE WHOLE BLOOD: 156 mg/dL (ref 70–179)
HCO3 ARTERIAL: 23 mmol/L (ref 22–27)
HEMOGLOBIN BLOOD GAS: 9.6 g/dL — ABNORMAL LOW
LACTATE BLOOD ARTERIAL: 0.9 mmol/L (ref <1.3)
O2 SATURATION ARTERIAL: >100 % — ABNORMAL HIGH (ref 94.0–100.0)
PCO2 ARTERIAL: 35.3 mmHg (ref 35.0–45.0)
PH ARTERIAL: 7.44 (ref 7.35–7.45)
PO2 ARTERIAL: 376 mmHg — ABNORMAL HIGH (ref 80.0–110.0)
POTASSIUM WHOLE BLOOD: 3.8 mmol/L (ref 3.4–4.6)
SODIUM WHOLE BLOOD: 137 mmol/L (ref 135–145)

## 2020-11-10 LAB — CBC
HEMATOCRIT: 30.7 % — ABNORMAL LOW (ref 34.0–44.0)
HEMATOCRIT: 30.9 % — ABNORMAL LOW (ref 34.0–44.0)
HEMOGLOBIN: 10.1 g/dL — ABNORMAL LOW (ref 11.3–14.9)
HEMOGLOBIN: 9.9 g/dL — ABNORMAL LOW (ref 11.3–14.9)
MEAN CORPUSCULAR HEMOGLOBIN CONC: 32.8 g/dL (ref 32.0–36.0)
MEAN CORPUSCULAR HEMOGLOBIN CONC: 32 g/dL (ref 32.0–36.0)
MEAN CORPUSCULAR HEMOGLOBIN: 25.9 pg (ref 25.9–32.4)
MEAN CORPUSCULAR HEMOGLOBIN: 25.3 pg — ABNORMAL LOW (ref 25.9–32.4)
MEAN CORPUSCULAR VOLUME: 78.9 fL (ref 77.6–95.7)
MEAN CORPUSCULAR VOLUME: 79 fL (ref 77.6–95.7)
MEAN PLATELET VOLUME: 7.1 fL (ref 6.8–10.7)
MEAN PLATELET VOLUME: 6.7 fL — ABNORMAL LOW (ref 6.8–10.7)
PLATELET COUNT: 588 10*9/L — ABNORMAL HIGH (ref 150–450)
PLATELET COUNT: 550 10*9/L — ABNORMAL HIGH (ref 150–450)
RED BLOOD CELL COUNT: 3.89 10*12/L — ABNORMAL LOW (ref 3.95–5.13)
RED BLOOD CELL COUNT: 3.92 10*12/L — ABNORMAL LOW (ref 3.95–5.13)
RED CELL DISTRIBUTION WIDTH: 17.7 % — ABNORMAL HIGH (ref 12.2–15.2)
RED CELL DISTRIBUTION WIDTH: 17.9 % — ABNORMAL HIGH (ref 12.2–15.2)
WBC ADJUSTED: 10.1 10*9/L (ref 3.6–11.2)
WBC ADJUSTED: 11.4 10*9/L — ABNORMAL HIGH (ref 3.6–11.2)

## 2020-11-10 LAB — PROTIME-INR
INR: 1.25
PROTIME: 14.2 s — ABNORMAL HIGH (ref 9.8–12.8)

## 2020-11-10 LAB — MAGNESIUM: MAGNESIUM: 1.8 mg/dL (ref 1.6–2.6)

## 2020-11-10 LAB — PHOSPHORUS: PHOSPHORUS: 2.7 mg/dL (ref 2.4–5.1)

## 2020-11-10 MED ADMIN — bupivacaine-epinephrine (PF) (MARCAINE-PF w/EPI) 0.25 %-1:200,000 injection (PF): @ 13:00:00 | Stop: 2020-11-10

## 2020-11-10 MED ADMIN — acetaminophen (TYLENOL) tablet 650 mg: 650 mg | ORAL | @ 01:00:00

## 2020-11-10 MED ADMIN — oxyCODONE (ROXICODONE) immediate release tablet 10 mg: 10 mg | ORAL | @ 18:00:00 | Stop: 2020-11-10

## 2020-11-10 MED ADMIN — bacitracin ointment: TOPICAL | @ 13:00:00 | Stop: 2020-11-10

## 2020-11-10 MED ADMIN — promethazine (PHENERGAN) tablet 12.5 mg: 12.5 mg | ORAL | @ 07:00:00 | Stop: 2020-11-10

## 2020-11-10 MED ADMIN — phenylephrine 0.8 mg/10 mL (80 mcg/mL) injection: INTRAVENOUS | @ 13:00:00 | Stop: 2020-11-10

## 2020-11-10 MED ADMIN — dexamethasone (DECADRON) 4 mg/mL injection 4 mg: 4 mg | INTRAVENOUS | @ 10:00:00 | Stop: 2020-11-10

## 2020-11-10 MED ADMIN — fentaNYL (PF) (SUBLIMAZE) injection: INTRAVENOUS | @ 15:00:00 | Stop: 2020-11-10

## 2020-11-10 MED ADMIN — oxyCODONE (ROXICODONE) immediate release tablet 10 mg: 10 mg | ORAL | @ 09:00:00 | Stop: 2020-11-10

## 2020-11-10 MED ADMIN — dexamethasone (DECADRON) 4 mg/mL injection 4 mg: 4 mg | INTRAVENOUS | @ 16:00:00

## 2020-11-10 MED ADMIN — famotidine (PEPCID) tablet 20 mg: 20 mg | ORAL | @ 01:00:00

## 2020-11-10 MED ADMIN — gelatin absorbable (GELFOAM) 100 sponge: TOPICAL | @ 13:00:00 | Stop: 2020-11-10

## 2020-11-10 MED ADMIN — mannitol 20 % infusion: INTRAVENOUS | @ 12:00:00 | Stop: 2020-11-10

## 2020-11-10 MED ADMIN — remifentaniL (ULTIVA) 1,000 mcg in sodium chloride (NS) 20 mL: INTRAVENOUS | @ 12:00:00 | Stop: 2020-11-10

## 2020-11-10 MED ADMIN — acetaminophen (OFIRMEV) 10 mg/mL injection 1,000 mg: 1000 mg | INTRAVENOUS | @ 08:00:00 | Stop: 2020-11-10

## 2020-11-10 MED ADMIN — phenylephrine 0.8 mg/10 mL (80 mcg/mL) injection: INTRAVENOUS | @ 12:00:00 | Stop: 2020-11-10

## 2020-11-10 MED ADMIN — HYDROmorphone (DILAUDID) injection 0.5 mg: .5 mg | INTRAVENOUS | @ 17:00:00 | Stop: 2020-11-10

## 2020-11-10 MED ADMIN — phenylephrine 20 mg in sodium chloride 0.9% 250 mL (80 mcg/mL) infusion PMB: INTRAVENOUS | @ 13:00:00 | Stop: 2020-11-10

## 2020-11-10 MED ADMIN — ALPRAZolam (XANAX) tablet 0.5 mg: .5 mg | ORAL | @ 19:00:00 | Stop: 2020-11-10

## 2020-11-10 MED ADMIN — promethazine (PHENERGAN) tablet 12.5 mg: 12.5 mg | ORAL | @ 01:00:00

## 2020-11-10 MED ADMIN — sodium chloride (NS) 0.9 % infusion: 75 mL/h | INTRAVENOUS | @ 16:00:00

## 2020-11-10 MED ADMIN — HYDROmorphone (DILAUDID) injection 0.5 mg: .5 mg | INTRAVENOUS | Stop: 2020-11-10

## 2020-11-10 MED ADMIN — sugammadex (BRIDION) injection: INTRAVENOUS | @ 15:00:00 | Stop: 2020-11-10

## 2020-11-10 MED ADMIN — LORazepam (ATIVAN) tablet 1 mg: 1 mg | ORAL | @ 04:00:00 | Stop: 2020-11-10

## 2020-11-10 MED ADMIN — dexamethasone (DECADRON) 4 mg/mL injection: INTRAVENOUS | @ 12:00:00 | Stop: 2020-11-10

## 2020-11-10 MED ADMIN — insulin regular (HumuLIN,NovoLIN) injection 0-20 Units: 0-20 [IU] | SUBCUTANEOUS | @ 20:00:00

## 2020-11-10 MED ADMIN — ondansetron (ZOFRAN) injection: INTRAVENOUS | @ 14:00:00 | Stop: 2020-11-10

## 2020-11-10 MED ADMIN — sodium chloride (NS) 0.9 % infusion: 75 mL/h | INTRAVENOUS | @ 04:00:00

## 2020-11-10 MED ADMIN — dexamethasone (DECADRON) 4 mg/mL injection 10 mg: 10 mg | INTRAVENOUS | @ 07:00:00 | Stop: 2020-11-10

## 2020-11-10 MED ADMIN — MORPhine injection 2 mg: 2 mg | INTRAVENOUS | @ 16:00:00 | Stop: 2020-11-10

## 2020-11-10 MED ADMIN — lactated Ringers infusion: INTRAVENOUS | @ 11:00:00 | Stop: 2020-11-10

## 2020-11-10 MED ADMIN — VECuronium (NORCURON) injection: INTRAVENOUS | @ 13:00:00 | Stop: 2020-11-10

## 2020-11-10 MED ADMIN — sodium chloride (NS) 0.9 % infusion: INTRAVENOUS | @ 12:00:00 | Stop: 2020-11-10

## 2020-11-10 MED ADMIN — dexamethasone (DECADRON) 4 mg/mL injection 4 mg: 4 mg | INTRAVENOUS | @ 22:00:00

## 2020-11-10 MED ADMIN — propofoL (DIPRIVAN) injection: INTRAVENOUS | @ 12:00:00 | Stop: 2020-11-10

## 2020-11-10 MED ADMIN — midazolam (VERSED) injection: INTRAVENOUS | @ 11:00:00 | Stop: 2020-11-10

## 2020-11-10 MED ADMIN — docusate sodium (COLACE) capsule 100 mg: 100 mg | ORAL | @ 01:00:00

## 2020-11-10 MED ADMIN — levETIRAcetam (KEPPRA) injection: INTRAVENOUS | @ 12:00:00 | Stop: 2020-11-10

## 2020-11-10 MED ADMIN — ROCuronium (ZEMURON) injection: INTRAVENOUS | @ 12:00:00 | Stop: 2020-11-10

## 2020-11-10 MED ADMIN — vancomycin (VANCOCIN) IVPB 1000 mg (premix): INTRAVENOUS | @ 12:00:00 | Stop: 2020-11-10

## 2020-11-10 MED ADMIN — HYDROmorphone (DILAUDID) injection 0.5 mg: .5 mg | INTRAVENOUS | @ 21:00:00 | Stop: 2020-11-10

## 2020-11-10 MED ADMIN — levETIRAcetam (KEPPRA) 500mg/100 mL (5mg/mL) in iso-osmotic NaCL: 500 mg | INTRAVENOUS | @ 16:00:00

## 2020-11-10 MED ADMIN — cellulose, oxidized reg 2"X 14" pad (SURGICEL): TOPICAL | @ 13:00:00 | Stop: 2020-11-10

## 2020-11-10 MED ADMIN — fentaNYL (PF) (SUBLIMAZE) injection: INTRAVENOUS | @ 12:00:00 | Stop: 2020-11-10

## 2020-11-10 MED ADMIN — melatonin tablet 3 mg: 3 mg | ORAL | @ 01:00:00

## 2020-11-10 MED ADMIN — LORazepam (ATIVAN) tablet 1 mg: 1 mg | ORAL | @ 10:00:00 | Stop: 2020-11-10

## 2020-11-10 MED ADMIN — dexamethasone (DECADRON) 4 mg/mL injection: INTRAVENOUS | @ 16:00:00 | Stop: 2020-11-10

## 2020-11-10 MED ADMIN — succinylcholine (ANECTINE) injection: INTRAVENOUS | @ 12:00:00 | Stop: 2020-11-10

## 2020-11-10 MED ADMIN — matrix hemostatic sealant (FLOSEAL) topical: TOPICAL | @ 13:00:00 | Stop: 2020-11-10

## 2020-11-10 MED ADMIN — oxyCODONE (ROXICODONE) immediate release tablet 5 mg: 5 mg | ORAL | @ 23:00:00 | Stop: 2020-11-10

## 2020-11-10 MED ADMIN — oxyCODONE (ROXICODONE) immediate release tablet 10 mg: 10 mg | ORAL | @ 22:00:00 | Stop: 2020-11-10

## 2020-11-10 MED ADMIN — acetaminophen (TYLENOL) tablet 650 mg: 650 mg | ORAL | @ 19:00:00 | Stop: 2020-11-10

## 2020-11-10 MED ADMIN — dexamethasone (DECADRON) 4 mg/mL injection 10 mg: 10 mg | INTRAVENOUS | @ 01:00:00 | Stop: 2020-11-11

## 2020-11-10 MED ADMIN — thrombin (recombinant) topical: TOPICAL | @ 14:00:00 | Stop: 2020-11-10

## 2020-11-10 MED ADMIN — magnesium sulfate 2gm/50mL IVPB: 2 g | INTRAVENOUS | @ 17:00:00 | Stop: 2020-11-10

## 2020-11-10 MED ADMIN — cellulose, oxidized reg 1"X 2" pad (SURGICEL FIBRILLAR): TOPICAL | @ 13:00:00 | Stop: 2020-11-10

## 2020-11-10 MED ADMIN — sodium chloride irrigation (NS) 0.9 % irrigation solution: @ 14:00:00 | Stop: 2020-11-10

## 2020-11-10 NOTE — Unmapped (Addendum)
Problem: Adult Inpatient Plan of Care  Goal: Plan of Care Review  Outcome: Ongoing - Unchanged  Note: Pt A&Ox4. Vital signs stable. Neuro: no acute change. Safety precautions in place. Family at bedside. PRN given for anxiety, pain, and nausea. NS running at 75 mLs/hr and NPO at 00:00. Plan of care reviewed, understanding verbalized. Pt received CHG bath before OR procedure. Report given to pre-op RN. Urine sample sent with pt. No needs at this time. Will continue to monitor.   Goal: Patient-Specific Goal (Individualized)  Outcome: Ongoing - Unchanged  Flowsheets (Taken 11/10/2020 0114)  Patient-Specific Goals (Include Timeframe): Pt will receive CHG bath before OR on 9/15.  Individualized Care Needs: PRN anxiety and pain, CHG prep for OR, 0.9% NS  Anxieties, Fears or Concerns: pt is anxious about this hospitalization  Goal: Absence of Hospital-Acquired Illness or Injury  Outcome: Ongoing - Unchanged  Intervention: Identify and Manage Fall Risk  Recent Flowsheet Documentation  Taken 11/09/2020 2000 by Lenora Boys, RN  Safety Interventions:  ??? family at bedside  ??? low bed  ??? lighting adjusted for tasks/safety  Goal: Optimal Comfort and Wellbeing  Outcome: Ongoing - Unchanged  Goal: Readiness for Transition of Care  Outcome: Ongoing - Unchanged  Goal: Rounds/Family Conference  Outcome: Ongoing - Unchanged     Problem: Impaired Wound Healing  Goal: Optimal Wound Healing  Outcome: Ongoing - Unchanged     Problem: Self-Care Deficit  Goal: Improved Ability to Complete Activities of Daily Living  Outcome: Ongoing - Unchanged

## 2020-11-10 NOTE — Unmapped (Signed)
MRI on hold until screening form is completed in epic. Please answer all the questions in the form. If patient isn't able to answer the questions, please have immediate family member to help answer the questions and list their name and their number in the forms.     Patient needs to be in a hospital gown. IV should be med locked or have 30 feet of extension on. All medication patches should be removed prior to coming to MRI. Please call MRI at [phone] if you have any questions.     Thank you!

## 2020-11-10 NOTE — Unmapped (Cosign Needed)
Neurocritical Care   Admission Note     CODE STATUS:  FULL    Date of service: 11/10/2020  Hospital Day:  LOS: 1 day      HPI     Sheri [name] is a 25 y.o. female with a history of breast cancer with metastasis, anxiety, depression, and PTSD who presented to the ED with 1 week of headache, nausea/vomiting, and multiple new brain metastasis on brain MRI. She was taken to the OR for resection of the largest lesion in the left frontal area. She is now transferred to the NSICU post-operatively for monitoring.    History     PAST MEDICAL HISTORY  Past Medical History:   Diagnosis Date   ??? Anxiety    ??? Depression    ??? Malignant neoplasm of upper-outer quadrant of left breast in female, estrogen receptor negative (CMS-HCC) 12/10/2017   ??? Panic disorder    ??? PTSD (post-traumatic stress disorder)      SURGICAL HISTORY  Past Surgical History:   Procedure Laterality Date   ??? AUGMENTATION MAMMAPLASTY Left 08/2018    left only    ??? BREAST BIOPSY Left 2019    malignant   ??? CHEMOTHERAPY Left 11/2017    May 13 2018   ??? IR INSERT PORT AGE GREATER THAN 5 YRS  12/18/2017    IR INSERT PORT AGE GREATER THAN 5 YRS 12/18/2017 [name], MD IMG VIR [facility]   ??? MASTECTOMY Left 05/2018   ??? PR BRONCHOSCOPY,BIOPSY Right 10/23/2019    Procedure: Bronchoscopy, Rigid/Flexible, Include Fluoro Guide When Performed; W/Bronch/Endobronch Bx, Single/Mult Site;  Surgeon: Mercy Moore, MD;  Location: MAIN OR [facility];  Service: Pulmonary   ??? PR BRONCHOSCOPY,TRANSBRON ASPIR BX Right 10/23/2019    Procedure: Bronchoscopy, Rigid/Flex, Incl Fluoro; W/Transbronch Ndl Aspirat Bx, Trachea, Main Stem &/Or Lobar Bronchus;  Surgeon: Mercy Moore, MD;  Location: MAIN OR [facility];  Service: Pulmonary   ??? PR BX/REMV,LYMPH NODE,DEEP AXILL Left 05/30/2018    Procedure: BX/EXC LYMPH NODE; OPEN, DEEP AXILRY NODE;  Surgeon: Talbert Cage, DO;  Location: ASC OR [facility];  Service: Surgical Oncology Breast   ??? PR EXC TUMOR SOFT TISSUE NECK/ANT THORAX SUBQ 3+CM Right 10/23/2018    Procedure: Priority EXCISION, TUMOR, SOFT TISSUE OF NECK OR ANTERIOR THORAX, SUBCUTANEOUS; 3 CM OR GREATER;  Surgeon: Talbert Cage, DO;  Location: ASC OR [facility];  Service: Surgical Oncology Breast   ??? PR IMPLNT BIO IMPLNT FOR SOFT TISSUE REINFORCEMENT Left 05/30/2018    Procedure: IMPLANTATION BIOLOGIC IMPLANT(EG, ACELLULAR DERMAL MATRIX) FOR SOFT TISSUE REINFORCEMENT(EG, BREAST, TRUNK);  Surgeon: Arsenio Katz, MD;  Location: ASC OR [facility];  Service: Plastics   ??? PR INSERTION BREAST IMPLANT SAME DAY OF MASTECTOMY Left 05/30/2018    Procedure: IMMED INSRT BREAST PROSTH AFTER MASTOPEX/MASTECT;  Surgeon: Arsenio Katz, MD;  Location: ASC OR [facility];  Service: Plastics   ??? PR INTRAOPERATIVE SENTINEL LYMPH NODE ID W DYE INJECTION Left 05/30/2018    Procedure: INTRAOPERATIVE IDENTIFICATION SENTINEL LYMPH NODE(S) INCLUDE INJECTION NON-RADIOACTIVE DYE, WHEN PERFORMED;  Surgeon: Talbert Cage, DO;  Location: ASC OR [facility];  Service: Surgical Oncology Breast   ??? PR MASTECTOMY, SIMPLE, COMPLETE Left 05/30/2018    Procedure: Urgent MASTECTOMY, SIMPLE, COMPLETE;  Surgeon: Talbert Cage, DO;  Location: ASC OR [facility];  Service: Surgical Oncology Breast   ??? PR RECMPL WND TRUNK 2.6-7.5 CM Left 10/23/2018    Procedure: REPR COMPLX TRUNK; 2.6 CM TO 7.5 CM;  Surgeon: Jacki Cones  Lafayette Dragon, MD;  Location: ASC OR [facility];  Service: Plastics   ??? PR REPLACEMENT TISSUE EXPANDER W/PERMANENT IMPLANT Left 09/24/2018    Procedure: REPLACEMENT OF TISSUE EXPANDER WITH PERMANENT PROSTHESIS;  Surgeon: Arsenio Katz, MD;  Location: MAIN OR [facility];  Service: Plastics   ??? PR REVISION OF RECONSTRUCTED BREAST Left 09/24/2018    Procedure: R21 - REVISION OF RECONSTRUCTED  BREAST;  Surgeon: Arsenio Katz, MD;  Location: MAIN OR [facility];  Service: Plastics   ??? PR SUSPENSION OF BREAST Right 09/24/2018    Procedure: MASTOPEXY FOR SYMMETRY PURPOSES;  Surgeon: Arsenio Katz, MD;  Location: MAIN OR [facility];  Service: Plastics   ??? PR TISSUE EXPANDER PLACEMENT BREAST RECONSTRUCTION Left 05/30/2018    Procedure: BREAST RECON IMMED/DELAY W/EXPANDR W/SUBSQT EXPA;  Surgeon: Arsenio Katz, MD;  Location: ASC OR [facility];  Service: Plastics     HOME MEDICATIONS  No current facility-administered medications on file prior to encounter.     Current Outpatient Medications on File Prior to Encounter   Medication Sig   ??? methylPREDNISolone (MEDROL DOSEPACK) 4 mg tablet follow package directions   ??? acetaminophen (TYLENOL) 500 MG tablet Take 1,000 mg by mouth every eight (8) hours.   ??? benzonatate (TESSALON PERLES) 100 MG capsule Take 1 capsule (100 mg total) by mouth Three (3) times a day as needed for cough.   ??? capecitabine (XELODA) 500 MG tablet Take 4 tablets (2,000 mg total) by mouth Two (2) times a day for 14 days . Followed by 7 days off drug.   ??? ergocalciferol-1,250 mcg, 50,000 unit, (VITAMIN D2-1,250 MCG, 50,000 UNIT,) 1,250 mcg (50,000 unit) capsule Take 1 capsule (1,250 mcg total) by mouth once a week.   ??? famotidine (PEPCID) 20 MG tablet Take 1 tablet (20 mg total) by mouth Two (2) times a day.   ??? ferrous sulfate 325 (65 FE) MG tablet Take 1 tablet (325 mg total) by mouth daily. Take with orange juice.   ??? gabapentin (NEURONTIN) 300 MG capsule Take 1 capsule (300 mg total) by mouth nightly.   ??? lidocaine-prilocaine (EMLA) 2.5-2.5 % cream Apply topically once as needed for up to 1 dose. Apply small amount to port site 30 minutes - 1 hour prior to access.   ??? loperamide (IMODIUM) 2 mg capsule Take 2 mg by mouth 4 (four) times a day as needed.    ??? naloxone (NARCAN) 4 mg nasal spray One spray in either nostril once for known/suspected opioid overdose. May repeat every 2-3 minutes in alternating nostril til EMS arrives   ??? naproxen (NAPROSYN) 500 MG tablet Take 1 tablet (500 mg total) by mouth in the morning and 1 tablet (500 mg total) in the evening. Take with meals.   ??? ondansetron (ZOFRAN-ODT) 4 MG disintegrating tablet Dissolve 1 tablet (4 mg total) on the tongue every eight (8) hours as needed for nausea for up to 30 doses.   ??? oxyCODONE (ROXICODONE) 15 MG immediate release tablet Take 1-2 tablets (15-30mg ) every 4-6 hours as needed for cancer related pain.   ??? pantoprazole (PROTONIX) 40 MG tablet Take 1 tablet (40 mg total) by mouth daily.   ??? polyethylene glycol (GLYCOLAX) 17 gram/dose powder Mix 17g (1 cap) in 4-8 ounces of extra liquid each day.   ??? prochlorperazine (COMPAZINE) 10 MG tablet Take 1 tablet (10 mg total) by mouth every six (6) hours as needed (nausea).   ??? senna (SENNA) 8.6 mg tablet Take 1-2 tablets by mouth two (2) times a  day as needed for constipation.   ??? sertraline (ZOLOFT) 100 MG tablet Take 2 tablets (200 mg total) by mouth daily.     Allergies  Amoxicillin, Meloxicam, and Shellfish containing products    Family History  Family History   Problem Relation Age of Onset   ??? Skin cancer Mother    ??? Breast cancer Neg Hx    ??? Colon cancer Neg Hx    ??? Endometrial cancer Neg Hx    ??? Ovarian cancer Neg Hx      Social History  Social History     Tobacco Use   ??? Smoking status: Never Smoker   ??? Smokeless tobacco: Never Used   ??? Tobacco comment: lives in a home with smokers   Vaping Use   ??? Vaping Use: Never used   Substance Use Topics   ??? Alcohol use: Not Currently   ??? Drug use: Never     Review of Systems     Pertinent items are noted in HPI.     Assessment and Plan     Sheri [name] is a 25 y.o. female admitted to NSICU with a diagnosis of brain mets s/p resection.     Neuro:  Daily interruption of sedation: N/A  Multi-Modality Monitoring: none  Current anticonvulsants: Keppra.  Mobilty protocol current stage: 2  Splints/PRAFO:  No  Restraints:  Not indicated  CAM-ICU:     RASS:      **Brain tumor, s/p resection POD #0  - follow up pathology  - goal SBP < 160 to prevent post-op hemorrhage  - post-op imaging: MRI with and without tomorrow  - start keppra 500mg  BID for post-op seizure prophylaxis x 7 days  - start decadron at 4mg  q6h for post-op cerebral edema, taper per SRN  - obtain PT/OT consults  - patient remains at risk for neurological decline due to post-op hemorrhage, cerebral edema, and seizures and requires close monitoring in the NSICU    **Anxiety/Depression  - continue home sertraline 200mg  daily    **Pain  - tylenol prn mild pain  - oxycodone prn moderate pain  - dilaudid PRN severe pain    Recent Labs   Lab Units 11/10/20  0828 11/10/20  0459   SODIUM WHOLE BLOOD mmol/L 137  --    SODIUM mmol/L  --  137     Cardiovascular:  Admission weight: 133.9kg  Daily Weight: (!) 131.8 kg (290 lb 9.1 oz)      Cardiac enzymes: No results in the last day    **Hemodynamic goals  - Current vasoactive medications:  None  - MAP > 65  - goal SBP < 180 to reduce risk of post-op hemorrhage  - labetolol/hydralazine prn    Heme/Onc  Current DVT prophylaxis: SCDs, No chemical DVT prophylaxis due to bleeding risk    - check post-op CBC  - hold chemical DVT prophylaxis until POD#2 due to high risk of hemorrhage    **Breast Cancer  - oncology and radiation oncology following  - hold home capecitabine  - obtain CT C/A/P and NM bone scan as able while admitted     Recent Labs   Lab Units 11/10/20  0459 11/09/20  0030 11/04/20  1233   HEMOGLOBIN g/dL 16.1* 09.6* 04.5   PLATELET COUNT (1) 10*9/L 588* 541* 645*     Lab Results   Component Value Date    INR 1.25 11/10/2020    APTT 25.0 (L) 11/10/2020     Pulmonary  The  HOB is elevated to greater than 30 degrees.  O2/Vent Settings:      - Titrate O2 for sat > 92%    GI  Weight: (!) 131.8 kg (290 lb 9.1 oz)   Body mass index is 46.9 kg/m??.    GI prophylaxis: PPI (home med)  BM:    Bowel regimen: start regimen when appropriate  Current nutritional source: NPO     **Nutrition  - bedside dysphagia screen   - advance diet as tolerated   - start oral diet    **Nausea  - zofran prn    Most recent LFTs:  Lab Results   Component Value Date    AST 8 11/09/2020    ALT <7 (L) 11/09/2020 ALKPHOS 164 (H) 11/09/2020    GGT 23 07/15/2020    BILITOT 0.3 11/09/2020    PROT 7.4 11/09/2020    ALBUMIN 3.1 (L) 11/09/2020     Renal/Endocrine    Intake/Output Summary (Last 24 hours) at 11/10/2020 1101  Last data filed at 11/10/2020 1007  Gross per 24 hour   Intake 2065 ml   Output 1075 ml   Net 990 ml     Admission weight: 133.9kg  Daily Weight: (!) 131.8 kg (290 lb 9.1 oz)   The patient current insulin regimen for blood glucose control:  SSI  Foley catheter: Foley to be removed today.     **Glycemic control  - start FSBS and SSI to prevent hyperglycemia and to assess insulin needs     **Fluids  - goal euvolemia, NS at 75 cc/hour   - medlock IVF when adequate PO  - foley now for strict ins/outs, d/c foley on POD#1  - obtain chemistry and supplement electrolytes as needed    Recent Labs     11/10/20  0104   POCGLU 147     Recent Labs   Lab Units 11/10/20  0459 11/09/20  0030 11/04/20  1233   CREATININE mg/dL 2.13* 0.86* 5.78*     No results found for: A1C  Potassium   Date Value Ref Range Status   11/10/2020 3.6 3.4 - 4.8 mmol/L Final     Potassium, Bld   Date Value Ref Range Status   11/10/2020 3.8 3.4 - 4.6 mmol/L Final     Magnesium   Date Value Ref Range Status   11/09/2020 2.0 1.6 - 2.6 mg/dL Final     No results found for: PHOS    ID  Temp (24hrs), Avg:36.8 ??C (98.3 ??F), Min:36.7 ??C (98.1 ??F), Max:37 ??C (98.6 ??F)    Recent Labs   Lab Units 11/10/20  0459 11/09/20  0030 11/04/20  1233   WBC 10*9/L 10.1 7.0 12.6*     - COVID negative 9/14 (personally reviewed)  - culture if temp > 38.5  - d/c aline on POD #1  - post-op antibiotics x 24 hours    Current Access:         -  PIV x 2       - Arterial line: Arterial line required for BP monitoring.       - Central venous line: No central line present.  Current antibiotics:    Cultures:  No results found for: BLOOD CULTURE, URINE CULTURE, LOWER RESPIRATORY CULTURE  WBC (10*9/L)   Date Value   11/10/2020 10.1     WBC, UA (/HPF)   Date Value   11/10/2020 1 Last CSF Analysis: No results found for: COLORCSF, APPEARCSF, NUCCELLSCSF, RBCCSF, NCSF, LYMPHSCSF, MONOMACCSF, EOSCSF,  OTHERCSF, SPUNA, PROTCSF, GLUCCSF    Tubes and drains:  Patient Lines/Drains/Airways Status     Active Active Lines, Drains, & Airways     Name Placement date Placement time Site Days    Power Port--a-Cath Single Hub 12/18/17 Right Chest 12/18/17  1103  Chest  1057    Closed/Suction Drain 1 Left Chest Bulb 15 Fr. 05/30/18  1108  Chest  894    Urethral Catheter Non-latex;Temperature probe 16 Fr. 11/10/20  0759  Non-latex;Temperature probe  less than 1    Peripheral IV 11/10/20 Right Hand 11/10/20  0752  Hand  less than 1    Peripheral IV 11/10/20 Right Foot 11/10/20  0752  Foot  less than 1    Arterial Line 11/10/20 Left Radial 11/10/20  0932  Radial  less than 1                   Objective Data        Temp:  [36.7 ??C (98.1 ??F)-37 ??C (98.6 ??F)] 37 ??C (98.6 ??F)  Heart Rate:  [78-93] 86  SpO2 Pulse:  [95] 95  Resp:  [15-23] 15  BP: (127-168)/(57-94) 137/77  MAP (mmHg):  [76-115] 91  SpO2:  [95 %-97 %] 97 %  BMI (Calculated):  [46.92] 46.92    Intake/Output Summary (Last 24 hours) at 11/10/2020 1101  Last data filed at 11/10/2020 1007  Gross per 24 hour   Intake 2065 ml   Output 1075 ml   Net 990 ml     SpO2: 97 %  Height: 167.6 cm (5' 6)   Weight: (!) 131.8 kg (290 lb 9.1 oz)   Body mass index is 46.9 kg/m??.         Hemodynamic/Invasive Device Data (24 hrs):       Ventilation/Oxygen Therapy (24hrs):     Medications:  Scheduled medications:   ??? [MAR Hold] dexamethasone  4 mg Intravenous Q6H   ??? [MAR Hold] ergocalciferol-1,250 mcg (50,000 unit)  1,250 mcg Oral Weekly   ??? [MAR Hold] ferrous sulfate  325 mg Oral Daily   ??? [MAR Hold] melatonin  3 mg Oral QPM   ??? [MAR Hold] pantoprazole  40 mg Oral Daily   ??? [MAR Hold] sertraline  200 mg Oral Daily     Continuous infusions:   ??? sodium chloride Stopped (11/10/20 0603)     PRN medications:   [MAR Hold] albuterol, [MAR Hold] calcium carbonate **AND** [MAR Hold] simethicone, [MAR Hold] hydrALAZINE, [MAR Hold] labetaloL, [MAR Hold] LORazepam, [MAR Hold] promethazine, [MAR Hold] SMOG               Physical Exam     All vital signs for the past 24 hours have been reviewed.  All laboratory studies resulted in the past 24 hours have been reviewed.     General Exam:  General: Awake and alert.   Lying in bed. Appears uncomfortable. No obvious distress. Anxious.     ENT:  Mucous membranes moist. Oropharynx clear.  Cardiovascular:  Regular rate and rhythm.  No murmurs.   2+ radial pulses bilaterally.   Extremities warm and nonedematous.  Respiratory: Breathing is comfortable and unlabored.  Lungs are clear to ausculation bilaterally.    Gastrointestinal: Soft, nontender, nondistended.  Extremities: Warm and well-perfused. No cyanosis, clubbing, or edema.  Skin: No obvious rashes or ecchymoses.    Neurological Exam:  Mental Status  LOC: awake and alert  Orientation: person, place and time  Neglect: not present  Speech:  normal  Language: fluent with intact naming, repetition, comprehension    Cranial Nerves  Pupils: PERRL  Corneals: present bilaterally  Gaze: normal  Face Sensory: intact and symmetric  Face Motor: normal and symmetric  Cough: strong  Tongue: midline    Motor:   RUE: commands and 5/5  LUE: commands and 5/5  RLE: commands and 5/5  LLE: commands and 5/5     Sensory:    Sensory is intact to light touch    Glasgow Coma Score  Motor: obeys commands = 6  Verbal: oriented = 5  Eyes: eyes open spontaneously = 4    DISPOSITION     The patient requires admission to the [facility] Intensive [facility] Unit for management of the above conditions.    Estimated Transfer Date:  TBD    BILLING     This is an initial evaluation of the patient.    Kerin Ransom  Neurocritical Care

## 2020-11-10 NOTE — Unmapped (Signed)
NEUROSURGERY PROGRESS NOTE      Brief History of Present Illness  Sheri [name] is a 25 y.o. female with PMH of L??breast??cancer??ER+ (5%), PR-, HER2-, metastatic to lung, hypertrophic osteoarthropathy, and PTSD who presents to the ED with 1 week of HA, nausea and vomiting and new brain metastasis on brain MRI. Patient has extensive metastatic disease in the cerebellum, temporal lobes, brainstem and bilateral frontal lobes. Largest lesion measures roughly 2 cm with significant surrounding vasogenic edema.    Subjective/Interval History  OR today for resection of left frontal lesion.     Interval Imaging Reviewed  na    Neurological Assessment and Plan  **Malignant neoplasm of upper-outer quadrant of left breast in female, estrogen receptor negative (CMS-HCC)  SBP <180  - OR today for resection of L frontal lesion  - dex 4 q 6 (received 10 mg load)  - radiation oncology   - oncology recommending palliative care consult  - Holding home capecitabine  - Restaging CTc/a/p and NM bone scan as able while admitted    **Electrolyte status  - prn      **Medication-induced coagulopathy requiring further treatment   - Anticoagulant/Antiplatelet: na   Last taken on: na    Non-neurologic problems being addressed:  prn      Daily Maintenance:  VTE chemoprophylaxis: hold  IV Fluids: IV  Bowel Regimen: Senna and Miralax  Foley status: nond  Nutrition: NPO    Disposition: Floor, then to ICU post op    For questions regarding patients, please page SRN Inpatient Pager: [phone].    ___________________________________________________________________      Neurological Exam  Eyes open spontaneously  Ox3  PERRL  EOMI  F=  TML  Motor strength 5/5 x 4  No pronator drift    The patient's vitals, intake/output, labs, orders, and relevant imaging were reviewed for the last 24 hours.    Problem List  Principal Problem:    Malignant neoplasm of upper-outer quadrant of left breast in female, estrogen receptor negative (CMS-HCC)  Resolved Problems:    * No resolved hospital problems. *

## 2020-11-10 NOTE — Unmapped (Signed)
Neurosurgery Operative Note    Preoperative Diagnosis:  Left frontal lobe metastasis    Postoperative Diagnosis:  Same    Procedure Performed:  1.  Left frontal craniotomy for resection of brain metastasis      2.  Stereotactic navigation    Teaching Surgeon:   Dickey Gave, MD    Resident Surgeon:   Carollee Herter, MD      Gaynelle Arabian, MD    Specimens:    Left brain mass for permanent pathology    Estimated Blood Loss:  25 mL    Skin Closure:   Vicryl Rapide with skin glue      Complications:   None    Indications for Surgery:  This is a patient with a known history of breast cancer.  She presented with headaches and imaging demonstrates the presence of multiple brain lesions that appear to be consistent with metastases.  In particular, the left frontal lobe mass was large with significant surrounding edema.  Although the patient will ultimately need radiation therapy, a decision was made to proceed with resection of the left frontal lobe mass and associated symptoms.    Operative Findings:   Firm well-circumscribed left frontal lobe mass    Procedure Details: The patient was identified and transported to the operating room.  After entering the room and then again prior to incision, timeouts were performed. We verified patient identification, procedure, site and side of surgery as well as antibiotic dosing. The patient was then placed under general anesthesia. All lines were placed and secured by the anesthesiology service.  She was positioned supine with her head turned gently to the right side in order to expose the left scalp.  Her head was secured in a 3 pin head holder.  All pressure points were padded.  The navigation system was now registered using surface matching.  Excellent registration was obtained which was confirmed with the use of anatomic landmarks.    We now used the navigation system to localize the left frontal lobe mass.  The location was marked on the left scalp.  We then planned a linear opening that would allow [place] access to the mass and was oriented in such a way that it could be later converted into a question mark incision.  We were able to part the hair and avoid clipping any of it.  The left frontal scalp was now prepped and draped in the usual sterile manner.  2 g of intravenous vancomycin were administered along with intravenous mannitol, Keppra and Decadron.    We started the procedure by infiltrating 0.25% bupivacaine with epinephrine into the incisional area.  The scalp was then sharply incised and the incision was carried deep using like cautery until we came down onto the cranial surface.  Working in a subperiosteal plane, we mobilized the scalp away from the center of the opening and maintain this exposure with a self-retaining retractor.  The navigation system was used to come from that we had the appropriate area of the cranial surface exposed.  A perforator bit was now used to place a single bur hole posterior to the lesion.  A combination of bone wax and electrocautery were used for hemostasis.  A series of dissectors were used to open the epidural plane and a craniotome was now used to create a circular bone flap that was removed from the immediate operative field.  The navigation system was used to confirm that we had adequate dural exposure to accommodate the surgical approach.  Hemostasis in the epidural plane was established with a combination of bipolar electrocautery and Surgicel.    The dura was now opened in a U-shaped fashion basing the dural flap medially.  4-0 Nurolon suture was used to retract the dura.  We noted that the underlying gyrus was markedly expanded and edematous.  The navigation system was used to identify the cortex just superficial to the lesion and we noted that the cortex was slightly discolored.  At this time, a corticectomy was performed with a combination of bipolar electrocautery and sharp dissection.  As soon as we penetrated through the pia, we noted a combination of edematous white matter and then a firm, rubbery mass that was in the expected location for the lesion.  Now working along the periphery of the rubbery mass, we took advantage of the firm consistency of the mass and the soft consistency of the surrounding white matter to create a plane circumferentially around the entire mass.  We worked systematically along all aspects until we are able to fully remove the mass in one piece.  This was sent to pathology for permanent section.    Now inspected the surgical bed, we saw no further evidence of residual tumor.  Hemostasis was established with multiple rounds of packing using Floseal and Gelfoam and all of this material was irrigated out of the operative wound prior to closure.  The cavity was lined with Surgicel prior to closure.  With excellent hemostasis established and no evidence of residual tumor, the dura was closed primarily with interrupted 4-0 Nurolon suture 0.  Bone flap was now replaced and secured into position using a series of titanium mesh rongeurs.  Wound was thoroughly irrigated the galea was now reapproximated with interrupted Vicryl sutures.  A combination of Vicryl Rapide suture and skin glue was used to close the skin.  Drapes were now taken down and the patient was removed from the 3 pin head holder.  She was awakened and extubated in the operating room then transferred to the intensive care unit.  All counts were correct at the end of the case.  There were no complications.      Teaching Surgeon Attestation: I was scrubbed and participated in the entire procedure.

## 2020-11-10 NOTE — Unmapped (Signed)
NEUROSURGERY PROGRESS NOTE    Brief History of Present Illness  Sheri [name] is a 25 y.o. female with triple negative breast cancer and multiple brain metastases who is now s/p left craniotomy for resection of mass (11/10/20)      Subjective/Interval History  Post op check    Interval Imaging Reviewed  none    Neurological Assessment and Plan  **Brain metastasis (CMS-HCC)  POD0  - SBP<180, Na>135  - post op ancef x24h  - keppra 500bid x7d  - dexamethasone 4q6  - MRI brain when able for post op baseline  - diet as tolerated  - PT/OT/SLP  - pain/nausea mgmt prn      Anticoagulant/Antiplatelet needs: hold until POD2    Disposition: ICU    This patient is co-managed with the [facility]. The management of this patient was discussed in detail with them and we agree with their overall assessment and plan.      ___________________________________________________________________    Neurological Exam  Groggy from anesthesia  EOV Ox2-3wc  PERRL EOMI  Slight R FD  TML  RUE 4+  LUE 5  RLE 4+  LLE 5  Incision c/d/i with glue      The patient's vitals, intake/output, labs, orders, and relevant imaging were reviewed for the last 24 hours.    Problem List  Principal Problem:    Brain metastasis (CMS-HCC)  Active Problems:    Malignant neoplasm of upper-outer quadrant of left breast in female, estrogen receptor negative (CMS-HCC)  Resolved Problems:    * No resolved hospital problems. *

## 2020-11-11 MED ADMIN — vancomycin (VANCOCIN) 2000 mg IVPB in 500 mL sodium chloride 0.9% (premix): 2000 mg | INTRAVENOUS | Stop: 2020-11-10

## 2020-11-11 MED ADMIN — acetaminophen (TYLENOL) tablet 650 mg: 650 mg | ORAL | @ 17:00:00 | Stop: 2020-11-12

## 2020-11-11 MED ADMIN — levETIRAcetam (KEPPRA) 500mg/100 mL (5mg/mL) in iso-osmotic NaCL: 500 mg | INTRAVENOUS | @ 02:00:00

## 2020-11-11 MED ADMIN — pantoprazole (PROTONIX) EC tablet 40 mg: 40 mg | ORAL | @ 13:00:00

## 2020-11-11 MED ADMIN — alteplase (ACTIVASE) injection small catheter clearance 1 mg: 1 mg | INTRAVENOUS | @ 21:00:00 | Stop: 2020-11-11

## 2020-11-11 MED ADMIN — HYDROmorphone (DILAUDID) injection 0.5 mg: .5 mg | INTRAVENOUS | @ 05:00:00 | Stop: 2020-11-11

## 2020-11-11 MED ADMIN — oxyCODONE (ROXICODONE) immediate release tablet 30 mg: 30 mg | ORAL | @ 06:00:00 | Stop: 2020-11-16

## 2020-11-11 MED ADMIN — HYDROmorphone (DILAUDID) injection 1 mg: 1 mg | INTRAVENOUS | @ 09:00:00 | Stop: 2020-11-11

## 2020-11-11 MED ADMIN — HYDROmorphone (DILAUDID) injection 1 mg: 1 mg | INTRAVENOUS | @ 21:00:00 | Stop: 2020-11-11

## 2020-11-11 MED ADMIN — oxyCODONE (ROXICODONE) immediate release tablet 30 mg: 30 mg | ORAL | @ 02:00:00 | Stop: 2020-11-17

## 2020-11-11 MED ADMIN — HYDROmorphone (DILAUDID) injection 0.5 mg: .5 mg | INTRAVENOUS | @ 03:00:00 | Stop: 2020-11-15

## 2020-11-11 MED ADMIN — oxyCODONE (ROXICODONE) immediate release tablet 10 mg: 10 mg | ORAL | @ 09:00:00 | Stop: 2020-11-11

## 2020-11-11 MED ADMIN — oxyCODONE (ROXICODONE) immediate release tablet 30 mg: 30 mg | ORAL | @ 19:00:00 | Stop: 2020-11-16

## 2020-11-11 MED ADMIN — HYDROmorphone (DILAUDID) injection 1 mg: 1 mg | INTRAVENOUS | @ 16:00:00 | Stop: 2020-11-11

## 2020-11-11 MED ADMIN — oxyCODONE (ROXICODONE) immediate release tablet 30 mg: 30 mg | ORAL | @ 13:00:00 | Stop: 2020-11-16

## 2020-11-11 MED ADMIN — levETIRAcetam (KEPPRA) 500mg/100 mL (5mg/mL) in iso-osmotic NaCL: 500 mg | INTRAVENOUS | @ 13:00:00 | Stop: 2020-11-11

## 2020-11-11 MED ADMIN — ferrous sulfate tablet 325 mg: 325 mg | ORAL | @ 13:00:00

## 2020-11-11 MED ADMIN — dexamethasone (DECADRON) 4 mg/mL injection 4 mg: 4 mg | INTRAVENOUS | @ 05:00:00 | Stop: 2020-11-11

## 2020-11-11 MED ADMIN — oxyCODONE (ROXICODONE) immediate release tablet 30 mg: 30 mg | ORAL | @ 23:00:00 | Stop: 2020-11-16

## 2020-11-11 MED ADMIN — dexAMETHasone (DECADRON) tablet 4 mg: 4 mg | ORAL | @ 23:00:00

## 2020-11-11 MED ADMIN — ondansetron (ZOFRAN) injection 4 mg: 4 mg | INTRAVENOUS | @ 12:00:00

## 2020-11-11 MED ADMIN — scopolamine (TRANSDERM-SCOP) 1 mg over 3 days topical patch 1.5 mg: 1 | TOPICAL | @ 18:00:00 | Stop: 2020-11-11

## 2020-11-11 MED ADMIN — polyethylene glycol (MIRALAX) packet 17 g: 17 g | ORAL | @ 13:00:00

## 2020-11-11 MED ADMIN — HYDROmorphone (DILAUDID) injection 1 mg: 1 mg | INTRAVENOUS | @ 12:00:00 | Stop: 2020-11-11

## 2020-11-11 MED ADMIN — sertraline (ZOLOFT) tablet 200 mg: 200 mg | ORAL | @ 13:00:00

## 2020-11-11 MED ADMIN — melatonin tablet 3 mg: 3 mg | ORAL | @ 23:00:00

## 2020-11-11 MED ADMIN — senna (SENOKOT) tablet 2 tablet: 2 | ORAL | @ 13:00:00

## 2020-11-11 MED ADMIN — dexAMETHasone (DECADRON) tablet 4 mg: 4 mg | ORAL | @ 16:00:00

## 2020-11-11 MED ADMIN — acetaminophen (TYLENOL) tablet 650 mg: 650 mg | ORAL | @ 06:00:00 | Stop: 2020-11-11

## 2020-11-11 MED ADMIN — dexamethasone (DECADRON) 4 mg/mL injection 4 mg: 4 mg | INTRAVENOUS | @ 11:00:00 | Stop: 2020-11-11

## 2020-11-11 MED ADMIN — levETIRAcetam (KEPPRA) tablet 500 mg: 500 mg | ORAL | @ 15:00:00 | Stop: 2020-11-17

## 2020-11-11 MED ADMIN — acetaminophen (TYLENOL) tablet 650 mg: 650 mg | ORAL | @ 13:00:00 | Stop: 2020-11-12

## 2020-11-11 MED ADMIN — acetaminophen (TYLENOL) tablet 650 mg: 650 mg | ORAL | @ 11:00:00 | Stop: 2020-11-11

## 2020-11-11 MED ADMIN — acetaminophen (TYLENOL) tablet 650 mg: 650 mg | ORAL | @ 02:00:00 | Stop: 2020-11-11

## 2020-11-11 MED ADMIN — oxyCODONE (ROXICODONE) immediate release tablet 30 mg: 30 mg | ORAL | @ 11:00:00 | Stop: 2020-11-16

## 2020-11-11 MED ADMIN — sodium chloride (NS) 0.9 % infusion: 75 mL/h | INTRAVENOUS | @ 09:00:00 | Stop: 2020-11-11

## 2020-11-11 MED ADMIN — oxyCODONE (ROXICODONE) immediate release tablet 10 mg: 10 mg | ORAL | @ 17:00:00 | Stop: 2020-11-11

## 2020-11-11 MED ADMIN — HYDROmorphone (DILAUDID) injection 1 mg: 1 mg | INTRAVENOUS | @ 07:00:00 | Stop: 2020-11-11

## 2020-11-11 NOTE — Unmapped (Incomplete)
Neurocritical Care   Progress Note     CODE STATUS:  FULL    Date of service: 11/11/2020  Hospital Day:  LOS: 2 days     Assessment and Plan     Sheri [name] is a 25 y.o. female admitted to NSICU with a diagnosis of brain mets s/p resection.     Neuro:  Daily interruption of sedation: N/A  Multi-Modality Monitoring: none  Current anticonvulsants: Keppra.  Mobilty protocol current stage: 2  Splints/PRAFO:  No  Restraints:  Not indicated  CAM-ICU:  CAM-ICU Result: CAM-ICU Negative (11/10/20 2000)  RASS: [place] Agitation Assessment Scale (RASS) : Anxious, apprehensive , but not aggressive (11/11/20 0400)    **Brain tumor, s/p resection POD #1  - follow up pathology  - goal SBP < 160 to prevent post-op hemorrhage  - post-op imaging: MRI with and without tomorrow  - start keppra 500mg  BID for post-op seizure prophylaxis x 7 days  - start decadron at 4mg  q6h for post-op cerebral edema, taper per SRN  - obtain PT/OT consults  - patient remains at risk for neurological decline due to post-op hemorrhage, cerebral edema, and seizures and requires close monitoring in the NSICU    **Anxiety/Depression  - continue home sertraline 200mg  daily    **Pain  - oxycodone 30mg  Q4 scheduled  - palliative care to follow  - tylenol prn mild pain  - oxycodone prn moderate pain  - dilaudid PRN severe pain    Recent Labs   Lab Units 11/10/20  1112   SODIUM mmol/L 134*       Cardiovascular:  Admission weight: 133.9kg  Daily Weight: (!) 133.9 kg (295 lb 3.1 oz)   A BP-2: (120-151)/(71-88) 130/83  MAP:  [56 mmHg-109 mmHg] 103 mmHg  Cardiac enzymes: No results in the last day    **Hemodynamic goals  - Current vasoactive medications:  None  - MAP > 65  - goal SBP < 180 to reduce risk of post-op hemorrhage  - labetolol/hydralazine prn    Heme/Onc  Current DVT prophylaxis: SCDs, No chemical DVT prophylaxis due to bleeding risk    - check post-op CBC  - hold chemical DVT prophylaxis until POD#2 due to high risk of hemorrhage    **Breast Cancer  - oncology and radiation oncology following  - hold home capecitabine  - obtain CT C/A/P and NM bone scan as able while admitted     Recent Labs   Lab Units 11/10/20  1112 11/10/20  0459 11/09/20  0030 11/04/20  1233   HEMOGLOBIN g/dL 9.9* 09.8* 11.9* 14.7   PLATELET COUNT (1) 10*9/L 550* 588* 541* 645*     Lab Results   Component Value Date    INR 1.25 11/10/2020    APTT 25.0 (L) 11/10/2020     Pulmonary  The HOB is elevated to greater than 30 degrees.  O2/Vent Settings:      - Titrate O2 for sat > 92%    GI  Weight: (!) 133.9 kg (295 lb 3.1 oz)   Body mass index is 47.65 kg/m??.    GI prophylaxis: PPI (home med)  BM: Last BM Date: 11/10/20  Bowel regimen: start regimen when appropriate  Current nutritional source: NPO     **Nutrition  - bedside dysphagia screen   - advance diet as tolerated   - start oral diet    **Nausea  - zofran prn    Most recent LFTs:  Lab Results   Component Value Date  AST 8 11/09/2020    ALT <7 (L) 11/09/2020    ALKPHOS 164 (H) 11/09/2020    GGT 23 07/15/2020    BILITOT 0.3 11/09/2020    PROT 7.4 11/09/2020    ALBUMIN 3.1 (L) 11/09/2020     Renal/Endocrine    Intake/Output Summary (Last 24 hours) at 11/11/2020 0914  Last data filed at 11/11/2020 0600  Gross per 24 hour   Intake 5108.54 ml   Output 2150 ml   Net 2958.54 ml     Admission weight: 133.9kg  Daily Weight: (!) 133.9 kg (295 lb 3.1 oz)   The patient current insulin regimen for blood glucose control:  SSI  Foley catheter: Foley to be removed today.     **Glycemic control  - start FSBS and SSI to prevent hyperglycemia and to assess insulin needs     **Fluids  - goal euvolemia, NS at 75 cc/hour   - medlock IVF when adequate PO  - foley now for strict ins/outs, d/c foley on POD#1  - obtain chemistry and supplement electrolytes as needed    Recent Labs     11/10/20  0104 11/10/20  1115 11/10/20  1551 11/10/20  2157   POCGLU 147 141 156 124     Recent Labs   Lab Units 11/10/20  1112 11/10/20  0459 11/09/20  0030 11/04/20  1233   CREATININE mg/dL 1.61* 0.96* 0.45* 4.09*     No results found for: A1C  Potassium   Date Value Ref Range Status   11/10/2020 3.6 3.4 - 4.8 mmol/L Final     Potassium, Bld   Date Value Ref Range Status   11/10/2020 3.8 3.4 - 4.6 mmol/L Final     Magnesium   Date Value Ref Range Status   11/10/2020 1.8 1.6 - 2.6 mg/dL Final     Phosphorus   Date Value Ref Range Status   11/10/2020 2.7 2.4 - 5.1 mg/dL Final       ID  Temp (81XBJ), Avg:36.8 ??C (98.3 ??F), Min:36.6 ??C (97.9 ??F), Max:37 ??C (98.6 ??F)    Recent Labs   Lab Units 11/10/20  1112 11/10/20  0459 11/09/20  0030 11/04/20  1233   WBC 10*9/L 11.4* 10.1 7.0 12.6*     - COVID negative 9/14 (personally reviewed)  - culture if temp > 38.5  - post-op antibiotics x 24 hours    Current Access:         -  PIV x 2       - Arterial line: No arterial line present.       - Central venous line: No central line present.  Current antibiotics:    Cultures:  No results found for: BLOOD CULTURE, URINE CULTURE, LOWER RESPIRATORY CULTURE  WBC (10*9/L)   Date Value   11/10/2020 11.4 (H)     WBC, UA (/HPF)   Date Value   11/10/2020 1        Last CSF Analysis: No results found for: COLORCSF, APPEARCSF, NUCCELLSCSF, RBCCSF, NCSF, LYMPHSCSF, MONOMACCSF, EOSCSF, OTHERCSF, SPUNA, PROTCSF, GLUCCSF    Tubes and drains:  Patient Lines/Drains/Airways Status     Active Active Lines, Drains, & Airways     Name Placement date Placement time Site Days    Power Port--a-Cath Single Hub 12/18/17 Right Chest 12/18/17  1103  Chest  1058    Closed/Suction Drain 1 Left Chest Bulb 15 Fr. 05/30/18  1108  Chest  895    Peripheral IV 11/10/20 Right Hand 11/10/20  1610  Hand  1                   Objective Data        Temp:  [36.6 ??C (97.9 ??F)-37 ??C (98.6 ??F)] 36.8 ??C (98.2 ??F)  Heart Rate:  [55-104] 78  SpO2 Pulse:  [59-104] 78  Resp:  [14-26] 23  BP: (115-169)/(38-91) 149/54  MAP (mmHg):  [65-107] 79  A BP-2: (120-151)/(71-88) 130/83  MAP:  [56 mmHg-109 mmHg] 103 mmHg  SpO2:  [94 %-98 %] 95 %    Intake/Output Summary (Last 24 hours) at 11/11/2020 0914  Last data filed at 11/11/2020 0600  Gross per 24 hour   Intake 5108.54 ml   Output 2150 ml   Net 2958.54 ml     SpO2: 95 %  Height: 167.6 cm (5' 6)   Weight: (!) 133.9 kg (295 lb 3.1 oz)   Body mass index is 47.65 kg/m??.         Hemodynamic/Invasive Device Data (24 hrs):  A BP-2: (120-151)/(71-88) 130/83  MAP:  [56 mmHg-109 mmHg] 103 mmHg    Ventilation/Oxygen Therapy (24hrs):     Medications:  Scheduled medications:   ??? acetaminophen  650 mg Oral Q6H SCH   ??? dexamethasone  4 mg Intravenous Q6H   ??? ferrous sulfate  325 mg Oral Daily   ??? insulin regular  0-20 Units Subcutaneous ACHS   ??? levETIRAcetam  500 mg Intravenous Q12H [facility]   ??? melatonin  3 mg Oral QPM   ??? oxyCODONE  30 mg Oral Q4H SCH   ??? pantoprazole  40 mg Oral Daily   ??? polyethylene glycol  17 g Oral Daily   ??? senna  2 tablet Oral Daily   ??? sertraline  200 mg Oral Daily     Continuous infusions:   ??? sodium chloride 75 mL/hr (11/11/20 0435)     PRN medications:   bisacodyL, dextrose in water, glucagon, glucose, HYDROmorphone, ondansetron, oxyCODONE               Physical Exam     All vital signs for the past 24 hours have been reviewed.  All laboratory studies resulted in the past 24 hours have been reviewed.     General Exam:  General: Awake and alert.   Lying in bed. Appears uncomfortable. No obvious distress. Anxious.     ENT:  Mucous membranes moist. Oropharynx clear.  Cardiovascular:  Regular rate and rhythm.  No murmurs.   2+ radial pulses bilaterally.   Extremities warm and nonedematous.  Respiratory: Breathing is comfortable and unlabored.  Lungs are clear to ausculation bilaterally.    Gastrointestinal: Soft, nontender, nondistended.  Extremities: Warm and well-perfused. No cyanosis, clubbing, or edema.  Skin: No obvious rashes or ecchymoses.    Neurological Exam:  Mental Status  LOC: awake and alert  Orientation: person, place and time  Neglect: not present  Speech: normal  Language: fluent with intact naming, repetition, comprehension    Cranial Nerves  Pupils: PERRL  Corneals: present bilaterally  Gaze: normal  Face Sensory: intact and symmetric  Face Motor: normal and symmetric  Cough: strong  Tongue: midline    Motor:   RUE: commands and 5/5  LUE: commands and 5/5  RLE: commands and 5/5  LLE: commands and 5/5     Sensory:    Sensory is intact to light touch    Glasgow Coma Score  Motor: obeys commands = 6  Verbal: oriented = 5  Eyes: eyes open spontaneously = 4    DISPOSITION     The patient requires admission to the [facility] for management of the above conditions.    Estimated Transfer Date:  9/16    BILLING     This is a subsequent evaluation of the patient.    Annye Asa  Neurocritical Care

## 2020-11-11 NOTE — Unmapped (Signed)
OCCUPATIONAL THERAPY  Evaluation (11/11/20 1130)    Patient Name:  Sheri [name]       Medical Record Number: [id number]   Date of Birth: Oct 11, 1995  Sex: Female          OT Treatment Diagnosis:  OT consult - debility s/p resection    Assessment  Problem List: Impaired vision  Assessment: Sheri [name] is a 25 y.o. female with triple negative breast cancer and multiple brain metastases who is now s/p left craniotomy for resection of mass (11/10/20).  Pt. presents with mild expressive communication difficulties as well as mild diplopia impacting safe ADL management however, pt. demonstrated ability to complete ADL at mod I.  Pt. justifies a minimally complex evaluation  Today's Interventions: Pt. educated on role of OT, safety, transfers.  Pt. able to complete ADL assessment as well as lower body dressing via figure four, room level functional mobility and hand hygiene while standing independently.  Pt. educated on compensatory strategies for double visiion and importance of monitoring visual changes and keeping MD informed of changes.    Activity Tolerance During Today's Session  Tolerated treatment well    Plan  Planned Frequency of Treatment:  1x per day for: 2-3x week       Planned Interventions:  Adaptive equipment, ADL retraining, Balance activities, Bed mobility, Compensatory tech. training, Conservation, Education - Patient, Education - Family / caregiver, Endurance activities, Environmental support, Functional cognition, Functional mobility, Home exercise program, Movement facilitation, Neuromuscular re-education, Passive range of motion, Postular / Proximal stability, Positioning, Postural intervention - prone, Postural intervention - supported sitting, Safety education, Splinting - see OT Orthotic Management, Swaddle bath, Therapeutic exercise, Therapist provided opportunity for spontaneous movement, Transfer training, UE Strength / coordination exercise, Visual / perceptual tasks    Post-Discharge Occupational Therapy Recommendations:   OT services not indicated (pt. encouraged to keep MD informed of visual progression 2/2 diplopia to see if followup consult merited to address visual impairment.)   OT DME Recommendations: None (pt. has access to shower chair already) -        GOALS:   Patient and Family Goals: to go home independently    IP Long Term Goal #1: Pt. will score 23+ on AMPAC within 3 weeks       Short Term:  Pt. will complete standing grooming at sink, including object management and sequencing independently   Time Frame : 2 weeks                                Prognosis:  Good  Positive Indicators:     Barriers to Discharge: None    Subjective  Current Status pt. left semi reclined in bed at end of session  Prior Functional Status independence for ADL and functional mobility prior to admission       Services patient receives: PT, OT, SLP         Past Medical History:   Diagnosis Date   ??? Anxiety    ??? Depression    ??? Malignant neoplasm of upper-outer quadrant of left breast in female, estrogen receptor negative (CMS-HCC) 12/10/2017   ??? Panic disorder    ??? PTSD (post-traumatic stress disorder)     Social History     Tobacco Use   ??? Smoking status: Never Smoker   ??? Smokeless tobacco: Never Used   ??? Tobacco comment: lives in a home with smokers   Substance Use Topics   ???  Alcohol use: Not Currently      Past Surgical History:   Procedure Laterality Date   ??? AUGMENTATION MAMMAPLASTY Left 08/2018    left only    ??? BREAST BIOPSY Left 2019    malignant   ??? CHEMOTHERAPY Left 11/2017    May 13 2018   ??? IR INSERT PORT AGE GREATER THAN 5 YRS  12/18/2017    IR INSERT PORT AGE GREATER THAN 5 YRS 12/18/2017 [name], MD [facility]   ??? MASTECTOMY Left 05/2018   ??? PR BRONCHOSCOPY,BIOPSY Right 10/23/2019    Procedure: Bronchoscopy, Rigid/Flexible, Include Fluoro Guide When Performed; W/Bronch/Endobronch Bx, Single/Mult Site;  Surgeon: Mercy Moore, MD;  Location: MAIN OR [facility];  Service: Pulmonary   ??? PR BRONCHOSCOPY,TRANSBRON ASPIR BX Right 10/23/2019    Procedure: Bronchoscopy, Rigid/Flex, Incl Fluoro; W/Transbronch Ndl Aspirat Bx, Trachea, Main Stem &/Or Lobar Bronchus;  Surgeon: Mercy Moore, MD;  Location: MAIN OR [facility];  Service: Pulmonary   ??? PR BX/REMV,LYMPH NODE,DEEP AXILL Left 05/30/2018    Procedure: BX/EXC LYMPH NODE; OPEN, DEEP AXILRY NODE;  Surgeon: Talbert Cage, DO;  Location: ASC OR [facility];  Service: Surgical Oncology Breast   ??? PR EXC TUMOR SOFT TISSUE NECK/ANT THORAX SUBQ 3+CM Right 10/23/2018    Procedure: Priority EXCISION, TUMOR, SOFT TISSUE OF NECK OR ANTERIOR THORAX, SUBCUTANEOUS; 3 CM OR GREATER;  Surgeon: Talbert Cage, DO;  Location: ASC OR [facility];  Service: Surgical Oncology Breast   ??? PR IMPLNT BIO IMPLNT FOR SOFT TISSUE REINFORCEMENT Left 05/30/2018    Procedure: IMPLANTATION BIOLOGIC IMPLANT(EG, ACELLULAR DERMAL MATRIX) FOR SOFT TISSUE REINFORCEMENT(EG, BREAST, TRUNK);  Surgeon: Arsenio Katz, MD;  Location: ASC OR [facility];  Service: Plastics   ??? PR INSERTION BREAST IMPLANT SAME DAY OF MASTECTOMY Left 05/30/2018    Procedure: IMMED INSRT BREAST PROSTH AFTER MASTOPEX/MASTECT;  Surgeon: Arsenio Katz, MD;  Location: ASC OR [facility];  Service: Plastics   ??? PR INTRAOPERATIVE SENTINEL LYMPH NODE ID W DYE INJECTION Left 05/30/2018    Procedure: INTRAOPERATIVE IDENTIFICATION SENTINEL LYMPH NODE(S) INCLUDE INJECTION NON-RADIOACTIVE DYE, WHEN PERFORMED;  Surgeon: Talbert Cage, DO;  Location: ASC OR [facility];  Service: Surgical Oncology Breast   ??? PR MASTECTOMY, SIMPLE, COMPLETE Left 05/30/2018    Procedure: Urgent MASTECTOMY, SIMPLE, COMPLETE;  Surgeon: Talbert Cage, DO;  Location: ASC OR [facility];  Service: Surgical Oncology Breast   ??? PR RECMPL WND TRUNK 2.6-7.5 CM Left 10/23/2018    Procedure: REPR COMPLX TRUNK; 2.6 CM TO 7.5 CM;  Surgeon: Arsenio Katz, MD;  Location: ASC OR [facility];  Service: Plastics   ??? PR REPLACEMENT TISSUE EXPANDER W/PERMANENT IMPLANT Left 09/24/2018    Procedure: REPLACEMENT OF TISSUE EXPANDER WITH PERMANENT PROSTHESIS;  Surgeon: Arsenio Katz, MD;  Location: MAIN OR [facility];  Service: Plastics   ??? PR REVISION OF RECONSTRUCTED BREAST Left 09/24/2018    Procedure: R21 - REVISION OF RECONSTRUCTED  BREAST;  Surgeon: Arsenio Katz, MD;  Location: MAIN OR [facility];  Service: Plastics   ??? PR SUSPENSION OF BREAST Right 09/24/2018    Procedure: MASTOPEXY FOR SYMMETRY PURPOSES;  Surgeon: Arsenio Katz, MD;  Location: MAIN OR [facility];  Service: Plastics   ??? PR TISSUE EXPANDER PLACEMENT BREAST RECONSTRUCTION Left 05/30/2018    Procedure: BREAST RECON IMMED/DELAY W/EXPANDR W/SUBSQT EXPA;  Surgeon: Arsenio Katz, MD;  Location: ASC OR [facility];  Service: Plastics    Family History   Problem Relation Age of Onset   ???  Skin cancer Mother    ??? Breast cancer Neg Hx    ??? Colon cancer Neg Hx    ??? Endometrial cancer Neg Hx    ??? Ovarian cancer Neg Hx         Amoxicillin, Meloxicam, and Shellfish containing products     Objective Findings  Precautions / Restrictions  Falls precautions, HOB elevated    Weight Bearing  Non-applicable    Required Braces or Orthoses  Non-applicable    Communication Preference  Verbal    Pain  Pt. denied pain    Equipment / Environment  Vascular access (PIV, TLC, Port-a-cath, PICC), Telemetry    Living Situation  Living Environment: House  Lives With: Mother  Home Living: One level home, Tub/shower unit, Standard height toilet, Shower chair with back, Grab bars in shower, Grab bars around toilet  Number of Stairs to Enter (outside): 4  Equipment available at home: [employer], Medical laboratory scientific officer, Air traffic controller chair with back, Bedside commode     Cognition   Orientation Level:      Arousal/Alertness:      Attention Span:      Memory:      Following Commands:      Safety Judgment:      Awareness of Errors:      Problem Solving:      Comments: Pt. awake, alert and insightful, with mild expressive communication difficulties and word finding difficulties but able to follow simple one step commands consistently    Vision / Hearing   Vision: Diplopia  Vision Comments: Pt. reported mild double vision, but able to navigate room appropriately and read wall signs.  Pt. encouraged to keep MD posted of visual changes (improvements and exacerbations) and seek consult for visual impairments if issue persists  Hearing: No deficit identified         Hand Function:  Right Hand Function: Right hand grip strength, ROM and coordination WNL  Left Hand Function: Left hand grip strength, ROM and coordination WNL    Skin Inspection:  Skin Inspection: Intact where visualized    ROM / Strength:  UE ROM/Strength: Left WFL, Right WFL  LE ROM/Strength: Left WFL, Right WFL    Coordination:  Coordination: WFL    Sensation:  Sensory/ Proprioception/ Stereognosis comments: Pt. denied paresthesia    Balance:  independence for static/dynamic sitting and standing activities    Functional Mobility  Transfer Assistance Needed: No  Bed Mobility Assistance Needed: No  Ambulation: standby assist      ADLs  ADLs: Modified Independent (seated bathing recommended)  IADLs: Pt. educated on driving cessation at this time.      Vitals / Orthostatics  At Rest: VSS per monitor  With Activity: VSS per monitor             Occupational Therapy Session Duration  OT Individual [mins]: 15         I attest that I have reviewed the above information.  Signed: Samuel Bouche, OT  Filed 11/11/2020
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
## 2020-11-11 NOTE — Unmapped (Signed)
Patient is currently q2 hour neuro exams. She is alert and oriented x4. Pt does have some word-finding difficulty but otherwise has clear speech. Pt is able to follow commands and overcome resistance x4. Pupils 3 and brisk. Pt does endorse being anxious. Pt has remained in NSR with SBP <180 met. Remains on RA. SCDs in place. Regular diet. Foley removed and pt has voided. No BM this shift. NS running at 75. Multiple PRNs given for pain. See MAR. See flowsheets for further details. Alarms remain active and audible. Will continue to monitor.       Problem: Adult Inpatient Plan of Care  Goal: Plan of Care Review  Outcome: Ongoing - Unchanged  Goal: Patient-Specific Goal (Individualized)  Outcome: Ongoing - Unchanged  Goal: Absence of Hospital-Acquired Illness or Injury  Outcome: Ongoing - Unchanged  Intervention: Identify and Manage Fall Risk  Recent Flowsheet Documentation  Taken 11/10/2020 1200 by Chevis Pretty, RN  Safety Interventions:   aspiration precautions   environmental modification   fall reduction program maintained   lighting adjusted for tasks/safety   low bed  Intervention: Prevent Skin Injury  Recent Flowsheet Documentation  Taken 11/10/2020 1115 by Chevis Pretty, RN  Skin Protection:   adhesive use limited   incontinence pads utilized   transparent dressing maintained   tubing/devices free from skin contact  Intervention: Prevent and Manage VTE (Venous Thromboembolism) Risk  Recent Flowsheet Documentation  Taken 11/10/2020 1200 by Chevis Pretty, RN  Activity Management: activity adjusted per tolerance  Range of Motion: Bilateral Upper and Lower Extremities  Taken 11/10/2020 1115 by Chevis Pretty, RN  VTE Prevention/Management: intravenous hydration  Goal: Optimal Comfort and Wellbeing  Outcome: Ongoing - Unchanged  Goal: Readiness for Transition of Care  Outcome: Ongoing - Unchanged  Goal: Rounds/Family Conference  Outcome: Ongoing - Unchanged     Problem: Impaired Wound Healing  Goal: Optimal Wound Healing  Outcome: Ongoing - Unchanged  Intervention: Promote Wound Healing  Recent Flowsheet Documentation  Taken 11/10/2020 1200 by Chevis Pretty, RN  Activity Management: activity adjusted per tolerance     Problem: Self-Care Deficit  Goal: Improved Ability to Complete Activities of Daily Living  Outcome: Ongoing - Unchanged     Problem: Skin Injury Risk Increased  Goal: Skin Health and Integrity  Outcome: Ongoing - Unchanged  Intervention: Optimize Skin Protection  Recent Flowsheet Documentation  Taken 11/10/2020 1600 by Chevis Pretty, RN  Head of Bed [facility]) Positioning: HOB at 30-45 degrees  Taken 11/10/2020 1400 by Chevis Pretty, RN  Head of Bed [facility]) Positioning: HOB at 30-45 degrees  Taken 11/10/2020 1200 by Chevis Pretty, RN  Head of Bed [facility]) Positioning: HOB at 30-45 degrees  Taken 11/10/2020 1115 by Chevis Pretty, RN  Pressure Reduction Techniques:   frequent weight shift encouraged   heels elevated off bed   weight shift assistance provided  Pressure Reduction Devices:   pressure-redistributing mattress utilized   positioning supports utilized  Skin Protection:   adhesive use limited   incontinence pads utilized   transparent dressing maintained   tubing/devices free from skin contact

## 2020-11-11 NOTE — Unmapped (Signed)
PHYSICAL THERAPY  Evaluation (11/11/20 0830)          Patient Name:?? Sheri [name]????????   Medical Record Number: [id number]   Date of Birth: 1996/02/09  Sex: Female??  ??    Treatment Diagnosis: deconditioning; unsteadiness on feet     Activity Tolerance: Tolerated treatment well     ASSESSMENT  Problem List: Decreased endurance, Gait deviation, Fall Risk, Impaired vision      Assessment : Sheri [name] is a 25 y.o. female with triple negative breast cancer and multiple brain metastases who is now s/p left craniotomy for resection of mass (11/10/20). Patient mobilizing well with SBA to intermittent CGA for safety; however, no loss of balance. Patient has excellent family support and all DME needs at home. PT to continue to follow patient to maximize functional mobility. Patient is considered a moderate complexity case. Based on mobility and family support, recommend PT 3x/week (community ambulator) at discharge.      Today's Interventions: AMPAC 20/24; PT evaluation; educated patient and father in the acute PT role/POC and the importance of staff assistance with OOB mobility to ensure safety; gait training with cues for maintaining forward line of progression; stairs management with cues for foot clearance and sequencing; educated patient and father in the importance of family supervision for mobility and stairs management; educated in avoiding bending over/dependent head positioning; educated in taking time with all mobility; encouraged use of DME as needed; educated in the importance of daily ambulation with nursing staff/family to continue to build strength and endurance                     Clinical Decision Making: Moderate      PLAN  Planned Frequency of Treatment:?? 1x per day for: 1-2x week       Planned Interventions: Education - Family / caregiver, Gait training, Functional mobility, Endurance activities, Balance activities, Education - Patient, Therapeutic exercise, Therapeutic activity, Stair training, Teacher, early years/pre, Home exercise program, Self-care / Home training     Post-Discharge Physical Therapy Recommendations:?? 3x weekly, Tourist information centre manager     PT DME Recommendations: None (pt has all DME needs at home)??????????       Goals:   Patient and Family Goals: none stated     Long Term Goal #1: pt will ambulate >1000 feet independently in 4 weeks        SHORT GOAL #1: pt will perform all functional transfers modified indep with LRAD  ?????????????????????? Time Frame : 1 week  SHORT GOAL #2: pt will ambulate x250 feet modified indep with LRAD  ?????????????????????? Time Frame : 1 week  SHORT GOAL #3: pt will negotiate x4 steps with railing indep  ?????????????????????? Time Frame : 1 week     ??????????????????????       ??????????????????????       Prognosis:?? Good  Positive Indicators: age, family support  Barriers to Discharge: None     SUBJECTIVE  Patient reports: patient in agreement with PT session  Current Functional Status: patient received and ended session supine with HOB elevated in the NSICU, RN Larita Fife) cleared session, patient's father present in room, all lines intact and needs in reach  Services patient receives: PT, OT, SLP  Prior Functional Status: patient reports she is independent with mobility without a device at baseline; patient's father reports patient with a h/o unsteadiness when ambulating over the past couple years due to bad joints and chemotherapy treatments; however, both patient and father deny falls; patient states that  she has been requiring PRN assistance from her family with lower body ADLS particularly donning and doffing shoes and socks  Equipment available at home: [employer], Medical laboratory scientific officer, Air traffic controller chair with back, Bedside commode            Objective Findings  Precautions / Restrictions  Precautions: Falls precautions, HOB elevated  Weight Bearing Status: Non-applicable  Required Braces or Orthoses: Non-applicable     Communication Preference: Verbal          Pain Comments: patient reporting It's not very good when asked about pain; unable to quantify; RN aware and will continue to monitor  Medical Tests / Procedures: reviewed in EPIC  Equipment / Environment: Patient not wearing mask for full session, Caregiver not wearing mask for full session, Vascular access (PIV, TLC, Port-a-cath, PICC), Telemetry     At Rest: VSS per monitor  With Activity: VSS per monitor  Orthostatics: denies dizziness; patient endorsing double vision midway through session during hallway ambulation        Living Situation  Living Environment: House  Lives With: Mother (patient currently lives with her mother; however, patient's father reports she also spends time at his house; family will be able to provide assistance as needed at home)  Home Living: One level home, Stairs to enter with rails, Walk-in shower, Bedside commode, Shower chair with back, Standard height toilet  Number of Stairs to Enter (outside): 4      Cognition comment: patient presenting with speech/word finding difficulties; patient able to respond well to yes/no questions; patient able to follow verbal commands  Visual/Perception:  (patient initially denying vision deficits; however, midway through session reporting onset of double vision; with further questioning patient reporting some vision deficits pre and post operatively)           Upper Extremities  UE comment: patient able to raise BUE against gravity    Lower Extremities  LE comment: patient able to raise BLE against gravity     Balance comment: independent sitting balance EOB; static standing balance independent without a device; dynamic balance/ambulation with SBA to intermittent CGA without a device      Bed Mobility: modified independent supine to and from sitting with HOB elevated; patient maintained long sitting balance and doffed her socks independently at end of session     Transfer comments: sitting to and from standing transfers at bedside with SBA without a device with no loss of balance; patient intermittently utilizing footboard for support      Gait Level of Assistance: Contact guard assist, steadying assist  Gait Assistive Device: None  Gait Distance Ambulated (ft): 400 ft  Gait: patient ambulated x250 feet with SBA and x150 feet with CGA; CGA provided for safety as patient reporting double vision/fatigue midway through session; patient with guarded gait pattern with slight postural veering and shuffle stepping pattern; patient reports she feels close to her baseline gait pattern; father reports She's been walking like she's afraid she's going to fall.; overall patient steady with no overt loss of balance and able to make starts, stops and turns     Stairs: stairs negotiation x2 steps with bilateral railings with SBA; no loss of balance                  Physical Therapy Session Duration  PT Individual [mins]: 35     Medical Staff Made Aware: RN Larita Fife) aware of patient's status     I attest that I have reviewed the above information.  Signed:  Felipa Emory, PT  Filed 11/11/2020

## 2020-11-11 NOTE — Unmapped (Signed)
Speech Language Pathology Cognitive Linguistic Evaluation  Evaluation (11/11/20 1015)      Patient Name:  Sheri [name]       Medical Record Number: [id number]   Date of Birth: 1995/06/25  Sex: Female            SLP Treatment Diagnosis:  cognitive-linguistic impairment  Activity Tolerance: Patient tolerated treatment well, Patient limited by fatigue, Patient limited by pain    Assessment   Initiated cognitive-linguistic assessment in the setting of post-op day one left frontal lesion resection. Exam limited by lethargy in the setting of recent pain medication administration, but pt able to rouse adequately to follow one-step commands, answer yes/no questions accurately, and produce 4-5 word utterances with intelligible speech. Does report (and confirmed by father at bedside) significant word-finding difficulty. Initiated education and will continue to follow this admission - recommend post-acute ST x3/week.    Prognosis: Good  Positive Indicators: + age, recency of resection  Barriers to Discharge: None     Plan of Care  SLP Follow-up / Frequency: 1x per day, 2-3x week    Treatment Goals:  Short Term Goal 1: Pt will participate in further cognitive-linguistic evaluation to determine post-acute ST goals    Date Established : 11/11/20 Time Frame : 1 week      Patient and Family Goal: to improve speech        Post Acute Discharge Recommendations  Post Acute SLP Discharge Recommendations: 3x weekly    Subjective  Lives With: Mother, Family     Current Functional Status: Sheri [name] is a 25 y.o. female with triple negative breast cancer and multiple brain metastases who is now s/p left craniotomy for resection of mass (11/10/20). Consult received for cognitive-linguistic evaluation. Sleeping but rousable. Father at bedside.  Communication Preference: Verbal  Patient/Caregiver Reports: pt reports interest in follow up speech and recent word-finding difficulty     Allergies: Amoxicillin, Meloxicam, and Shellfish containing products  Current Facility-Administered Medications   Medication Dose Route Frequency Provider Last Rate Last Admin   ??? acetaminophen (TYLENOL) tablet 650 mg  650 mg Oral Q6H SCH Miriam Camacho Stokes, ACNP   650 mg at 11/11/20 1309   ??? bisacodyL (DULCOLAX) suppository 10 mg  10 mg Rectal Daily PRN Cherre Robins, MD       ??? dexAMETHasone (DECADRON) tablet 4 mg  4 mg Oral Q6H SCH Cherre Robins, MD   4 mg at 11/11/20 1210   ??? dextrose (D10W) 10% bolus 125 mL  12.5 g Intravenous Q10 Min PRN Cherre Robins, MD       ??? ferrous sulfate tablet 325 mg  325 mg Oral Daily Cherre Robins, MD   325 mg at 11/11/20 0900   ??? HYDROmorphone (DILAUDID) injection 1 mg  1 mg Intravenous Q3H PRN Cherre Robins, MD   1 mg at 11/11/20 1210   ??? insulin regular (HumuLIN,NovoLIN) injection 0-20 Units  0-20 Units Subcutaneous ACHS Cherre Robins, MD   1 Units at 11/10/20 1612   ??? levETIRAcetam (KEPPRA) tablet 500 mg  500 mg Oral BID Cherre Robins, MD   500 mg at 11/11/20 1049   ??? melatonin tablet 3 mg  3 mg Oral QPM Cherre Robins, MD       ??? ondansetron (ZOFRAN) injection 4 mg  4 mg Intravenous Q6H PRN Cherre Robins, MD   4 mg at 11/11/20 0741   ??? oxyCODONE (ROXICODONE) immediate release tablet 10 mg  10 mg  Oral Q4H PRN Cherre Robins, MD   10 mg at 11/11/20 1309   ??? oxyCODONE (ROXICODONE) immediate release tablet 30 mg  30 mg Oral Q4H SCH Cherre Robins, MD   30 mg at 11/11/20 0913   ??? pantoprazole (PROTONIX) EC tablet 40 mg  40 mg Oral Daily Cherre Robins, MD   40 mg at 11/11/20 0901   ??? polyethylene glycol (MIRALAX) packet 17 g  17 g Oral Daily Cherre Robins, MD   17 g at 11/11/20 0902   ??? scopolamine (TRANSDERM-SCOP) 1 mg over 3 days topical patch 1.5 mg  1 patch Topical Q72H Cherre Robins, MD       ??? senna (SENOKOT) tablet 2 tablet  2 tablet Oral Daily Cherre Robins, MD   2 tablet at 11/11/20 0901   ??? sertraline (ZOLOFT) tablet 200 mg  200 mg Oral Daily Cherre Robins, MD   200 mg at 11/11/20 0901 ??? SMOG ENEMA  240 mL Rectal Daily PRN Cherre Robins, MD          Patient Active Problem List    Diagnosis Date Noted   ??? Brain metastasis (CMS-HCC) 11/10/2020   ??? Hypertrophic osteoarthropathy 10/13/2020   ??? Nausea 05/06/2020   ??? Arthralgia 12/08/2019   ??? S/P breast reconstruction 07/10/2018   ??? PTSD (post-traumatic stress disorder) 07/09/2018   ??? 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 Anxiety disorder 07/09/2018   ??? Symptomatic anemia 04/15/2018   ??? Microcytic anemia 02/04/2018   ??? Malignant neoplasm of upper-outer quadrant of left breast in female, estrogen receptor negative (CMS-HCC) 12/10/2017     Past Medical History:   Diagnosis Date   ??? Anxiety    ??? Depression    ??? Malignant neoplasm of upper-outer quadrant of left breast in female, estrogen receptor negative (CMS-HCC) 12/10/2017   ??? Panic disorder    ??? PTSD (post-traumatic stress disorder)        Past Surgical History:   Procedure Laterality Date   ??? AUGMENTATION MAMMAPLASTY Left 08/2018    left only    ??? BREAST BIOPSY Left 2019    malignant   ??? CHEMOTHERAPY Left 11/2017    May 13 2018   ??? IR INSERT PORT AGE GREATER THAN 5 YRS  12/18/2017    IR INSERT PORT AGE GREATER THAN 5 YRS 12/18/2017 [name], MD IMG [facility]   ??? MASTECTOMY Left 05/2018   ??? PR BRONCHOSCOPY,BIOPSY Right 10/23/2019    Procedure: Bronchoscopy, Rigid/Flexible, Include Fluoro Guide When Performed; W/Bronch/Endobronch Bx, Single/Mult Site;  Surgeon: Mercy Moore, MD;  Location: MAIN OR [facility];  Service: Pulmonary   ??? PR BRONCHOSCOPY,TRANSBRON ASPIR BX Right 10/23/2019    Procedure: Bronchoscopy, Rigid/Flex, Incl Fluoro; W/Transbronch Ndl Aspirat Bx, Trachea, Main Stem &/Or Lobar Bronchus;  Surgeon: Mercy Moore, MD;  Location: MAIN OR [facility];  Service: Pulmonary   ??? PR BX/REMV,LYMPH NODE,DEEP AXILL Left 05/30/2018    Procedure: BX/EXC LYMPH NODE; OPEN, DEEP AXILRY NODE;  Surgeon: Talbert Cage, DO;  Location: ASC OR [facility]; Service: Surgical Oncology Breast   ??? PR EXC TUMOR SOFT TISSUE NECK/ANT THORAX SUBQ 3+CM Right 10/23/2018    Procedure: Priority EXCISION, TUMOR, SOFT TISSUE OF NECK OR ANTERIOR THORAX, SUBCUTANEOUS; 3 CM OR GREATER;  Surgeon: Talbert Cage, DO;  Location: ASC OR [facility];  Service: Surgical Oncology Breast   ??? PR EXCIS SUPRATENT BRAIN TUMOR Left 11/10/2020    Procedure:  CRANIECTOMY; EXC BRAIN TUMOR-SUPRATENTORIAL;  Surgeon: Odie Sera, MD;  Location: MAIN OR [facility];  Service: Neurosurgery   ??? PR IMPLNT BIO IMPLNT FOR SOFT TISSUE REINFORCEMENT Left 05/30/2018    Procedure: IMPLANTATION BIOLOGIC IMPLANT(EG, ACELLULAR DERMAL MATRIX) FOR SOFT TISSUE REINFORCEMENT(EG, BREAST, TRUNK);  Surgeon: Arsenio Katz, MD;  Location: ASC OR [facility];  Service: Plastics   ??? PR INSERTION BREAST IMPLANT SAME DAY OF MASTECTOMY Left 05/30/2018    Procedure: IMMED INSRT BREAST PROSTH AFTER MASTOPEX/MASTECT;  Surgeon: Arsenio Katz, MD;  Location: ASC OR [facility];  Service: Plastics   ??? PR INTRAOPERATIVE SENTINEL LYMPH NODE ID W DYE INJECTION Left 05/30/2018    Procedure: INTRAOPERATIVE IDENTIFICATION SENTINEL LYMPH NODE(S) INCLUDE INJECTION NON-RADIOACTIVE DYE, WHEN PERFORMED;  Surgeon: Talbert Cage, DO;  Location: ASC OR [facility];  Service: Surgical Oncology Breast   ??? PR MASTECTOMY, SIMPLE, COMPLETE Left 05/30/2018    Procedure: Urgent MASTECTOMY, SIMPLE, COMPLETE;  Surgeon: Talbert Cage, DO;  Location: ASC OR [facility];  Service: Surgical Oncology Breast   ??? PR RECMPL WND TRUNK 2.6-7.5 CM Left 10/23/2018    Procedure: REPR COMPLX TRUNK; 2.6 CM TO 7.5 CM;  Surgeon: Arsenio Katz, MD;  Location: ASC OR [facility];  Service: Plastics   ??? PR REPLACEMENT TISSUE EXPANDER W/PERMANENT IMPLANT Left 09/24/2018    Procedure: REPLACEMENT OF TISSUE EXPANDER WITH PERMANENT PROSTHESIS;  Surgeon: Arsenio Katz, MD;  Location: MAIN OR [facility];  Service: Plastics   ??? PR REVISION OF RECONSTRUCTED BREAST Left 09/24/2018 Procedure: R21 - REVISION OF RECONSTRUCTED  BREAST;  Surgeon: Arsenio Katz, MD;  Location: MAIN OR [facility];  Service: Plastics   ??? PR STEREOTACTIC COMP ASSIST PROC,CRANIAL,INTRADURAL N/A 11/10/2020    Procedure: STEREOTACTIC COMPUTER-ASSISTED (NAVIGATIONAL) PROCEDURE; CRANIAL, INTRADURAL;  Surgeon: Odie Sera, MD;  Location: MAIN OR [facility];  Service: Neurosurgery   ??? PR SUSPENSION OF BREAST Right 09/24/2018    Procedure: MASTOPEXY FOR SYMMETRY PURPOSES;  Surgeon: Arsenio Katz, MD;  Location: MAIN OR [facility];  Service: Plastics   ??? PR TISSUE EXPANDER PLACEMENT BREAST RECONSTRUCTION Left 05/30/2018    Procedure: BREAST RECON IMMED/DELAY W/EXPANDR W/SUBSQT EXPA;  Surgeon: Arsenio Katz, MD;  Location: ASC OR [facility];  Service: Plastics     Social History     Tobacco Use   ??? Smoking status: Never Smoker   ??? Smokeless tobacco: Never Used   ??? Tobacco comment: lives in a home with smokers   Substance Use Topics   ??? Alcohol use: Not Currently     Family History   Problem Relation Age of Onset   ??? Skin cancer Mother    ??? Breast cancer Neg Hx    ??? Colon cancer Neg Hx    ??? Endometrial cancer Neg Hx    ??? Ovarian cancer Neg Hx        General:   Medical Tests / Procedures Comments: MRI  brain pre-op 9/14: Multiple T1 hypointense enhancing lesions with surrounding vasogenic edema, concerning for intracranial metastatic disease and detailed above. Largest of which is in the left frontal lobe measuring 1.8 x 1.5 cm with surrounding vasogenic edema, mild effacement of the left lateral ventricle and 7 mm rightward midline shift.  Equipment/Environment: Patient not wearing mask for full session, None  Services patient receives: PT, OT, SLP    Precautions / Restrictions  Precautions: Falls precautions, HOB elevated  Weight Bearing Status: Non-applicable  Required Braces or Orthoses: Non-applicable    Objective  Swallow Status: no reported or suspected  concerns  Motor Speech: fluent without evidence of significant apraxia or dysarthria   Oral Mechanism : face symmetric, tongue midline   Orientation: grossly oriented  Attention: Sustained attention is limited  Attention Comments: impacted by alertness     Auditory Comprehension: Yes/No Questions Within Functional Limits  Activity Tolerance: Patient tolerated treatment well, Patient limited by fatigue, Patient limited by pain    Speech Therapy Session Duration  SLP Individual [mins]: 16    I attest that I have reviewed the above information.  Signed: Vianne Bulls, CCC-SLP  Filed 11/11/2020

## 2020-11-11 NOTE — Unmapped (Signed)
CVAD Liaison - Partial Occlusion Note    CVAD Liaison Nurse was consulted for partial occlusion of the port lumen. At the bedside, pulsatile flushes were performed. Blood return not noted.     Decision made to reaccess port at this time as the dressing was inappropriate with no CHG gel.  Appropriate Huber needle size was obtained.  Port was accessed and dressing applied per protocol.  Blood return was not noted.  Line flushes freely.       The primary RN was advised to administer alteplase 2 mg/ 2mL to achieve patency. Patient is pending transfer to [facility] and may have to be done on the floor instead of [facility]. Please call or page our team anytime for support.      Thank you for this consult,  Elizebeth Brooking RN, CVAD Liaison     Consult Time 30 minutes (min)

## 2020-11-11 NOTE — Unmapped (Signed)
Solid Onc Consult Note    Discussed with primary team staging scans and ordering as previously recommended.     Recommendations:  - Would recommend against delaying patient's discharge over staging scans. The nuclear medicine study in particular will likely not happen over the weekend.  - Would recommend re-engaging radiation oncology for consideration of adjuvant radiation. I have reached out to them on your behalf    --  Carlynn Herald, MD/PhD  Medical Oncology Fellow

## 2020-11-11 NOTE — Unmapped (Signed)
Pt is alert and oriented x 4. PERRLA. FC x 4. Ambulates with standby-by assist. Sensation intact. Remains in NSR. BP stable. Afebrile. No respiratory distress or oxygen requirement. PRN dilaudid for head/surgical pain- effective. Pt has schedule pain medications ordered. Palliative consulted and spoke with patient this morning. PRN zofran and emesis episode x 1 this morning- resolved. Pt tolerating diet, fluids stopped per order. Pt voiding adequate amounts of urine. No BM to this hour. Pt able to reposition self in bed. No signs of skin breakdown noted. Family at bedside and updated on POC. Orders in for floor status, awaiting bed.      Problem: Adult Inpatient Plan of Care  Goal: Plan of Care Review  Outcome: Ongoing - Unchanged  Goal: Patient-Specific Goal (Individualized)  Outcome: Ongoing - Unchanged  Goal: Absence of Hospital-Acquired Illness or Injury  Outcome: Ongoing - Unchanged  Intervention: Identify and Manage Fall Risk  Recent Flowsheet Documentation  Taken 11/11/2020 0800 by Cherylann Banas, RN  Safety Interventions:   aspiration precautions   bed alarm   bleeding precautions   fall reduction program maintained   family at bedside   infection management   low bed   seizure precautions   nonskid shoes/slippers when out of bed   commode/urinal/bedpan at bedside  Intervention: Prevent Skin Injury  Recent Flowsheet Documentation  Taken 11/11/2020 0800 by Cherylann Banas, RN  Skin Protection: adhesive use limited  Intervention: Prevent and Manage VTE (Venous Thromboembolism) Risk  Recent Flowsheet Documentation  Taken 11/11/2020 1000 by Cherylann Banas, RN  Activity Management: activity adjusted per tolerance  Taken 11/11/2020 0800 by Cherylann Banas, RN  Activity Management: activity adjusted per tolerance  Intervention: Prevent Infection  Recent Flowsheet Documentation  Taken 11/11/2020 0800 by Cherylann Banas, RN  Infection Prevention:   cohorting utilized   rest/sleep promoted   hand hygiene promoted  Goal: Optimal Comfort and Wellbeing  Outcome: Ongoing - Unchanged  Goal: Readiness for Transition of Care  Outcome: Ongoing - Unchanged  Goal: Rounds/Family Conference  Outcome: Ongoing - Unchanged     Problem: Impaired Wound Healing  Goal: Optimal Wound Healing  Outcome: Ongoing - Unchanged  Intervention: Promote Wound Healing  Recent Flowsheet Documentation  Taken 11/11/2020 1000 by Cherylann Banas, RN  Activity Management: activity adjusted per tolerance  Taken 11/11/2020 0800 by Cherylann Banas, RN  Activity Management: activity adjusted per tolerance     Problem: Self-Care Deficit  Goal: Improved Ability to Complete Activities of Daily Living  Outcome: Ongoing - Unchanged     Problem: Skin Injury Risk Increased  Goal: Skin Health and Integrity  Outcome: Ongoing - Unchanged  Intervention: Optimize Skin Protection  Recent Flowsheet Documentation  Taken 11/11/2020 1000 by Cherylann Banas, RN  Pressure Reduction Techniques: heels elevated off bed  Head of Bed [facility]) Positioning: HOB at 30 degrees  Taken 11/11/2020 0800 by Cherylann Banas, RN  Pressure Reduction Techniques:   heels elevated off bed   weight shift assistance provided  Head of Bed (HOB) Positioning: HOB at 30 degrees  Pressure Reduction Devices:   positioning supports utilized   pressure-redistributing mattress utilized  Skin Protection: adhesive use limited     Problem: Adjustment to Surgery (Craniotomy/Craniectomy/Cranioplasty)  Goal: Optimal Coping with Surgery  Outcome: Ongoing - Unchanged     Problem: Bleeding (Craniotomy/Craniectomy/Cranioplasty)  Goal: Absence of Bleeding  Outcome: Ongoing - Unchanged     Problem: Bowel Motility Impaired (Craniotomy/Craniectomy/Cranioplasty)  Goal: Effective Bowel Elimination  Outcome: Ongoing -  Unchanged     Problem: Risk for Infection (Craniotomy/Craniectomy/Cranioplasty)  Goal: Absence of Infection Signs and Symptoms  Outcome: Ongoing - Unchanged  Intervention: Prevent or Manage Infection  Recent Flowsheet Documentation  Taken 11/11/2020 0800 by Cherylann Banas, RN  Infection Management: aseptic technique maintained     Problem: Pain (Craniotomy/Craniectomy/Cranioplasty)  Goal: Acceptable Pain Control  Outcome: Ongoing - Unchanged     Problem: Pain Chronic (Persistent)  Goal: Acceptable Pain Control and Functional Ability  Outcome: Ongoing - Unchanged

## 2020-11-11 NOTE — Unmapped (Shared)
Neurocritical Care   Supplementary Progress Note        ASSESSMENT / PLAN     Sheri [name] is a 25 y.o. female admitted to NSICU with a diagnosis of brain mets s/p resection. .  Please see today's progress note for full details.  Ongoing management of the active conditions and plan is below.    **Brain tumor, s/p resection POD #1  - follow up pathology  - goal SBP <160 to prevent Sheri [name]-op hemorrhage  - Sheri [name]-op imaging: MRI w/wo, pending  - continue keppra 500mg  BID for Loc Feinstein-op seizure prophylaxis x 7 days  - continue decadron at 4mg  q6h for Sheri [name]-op cerebral edema, taper per SRN  - obtain PT/OT consults  - patient remains at risk for neurological decline due to Sheri [name]-op hemorrhage, cerebral edema, and seizures and requires close monitoring in the NSICU    **Pain  - continue prns: tylenol 650mg  q6h, oxycodone 15mg  q4h + 30mg  q4h, dilaudid 0.5mg  q2h  - dilaudid PRN severe pain    **Breast Cancer  - oncology and radiation oncology following  - hold home capecitabine  - obtain CT C/A/P and NM bone scan as able while admitted     OBJECTIVE DATA       Vitals Reviewed:   Temp:  [36.6 ??C (97.9 ??F)-37 ??C (98.6 ??F)] 36.6 ??C (97.9 ??F)  Heart Rate:  [77-104] 104  SpO2 Pulse:  [77-104] 99  Resp:  [15-26] 21  BP: (117-169)/(51-94) 145/67  MAP (mmHg):  [76-115] 89  A BP-2: (120-151)/(71-88) 130/83  MAP:  [56 mmHg-109 mmHg] 103 mmHg  SpO2:  [94 %-97 %] 94 %  BMI (Calculated):  [46.92] 46.92  Temp (24hrs), Avg:36.8 ??C (98.3 ??F), Min:36.6 ??C (97.9 ??F), Max:37 ??C (98.6 ??F)    SpO2: 94 %  Height: 167.6 cm (5' 6)   Weight: (!) 133.9 kg (295 lb 3.1 oz)   Body mass index is 47.65 kg/m??.    Body surface area is 2.5 meters squared.     Continuous Infusions:  ??? sodium chloride 75 mL/hr (11/10/20 1133)       Ventilator settings:           NEURO EXAM      Mental status:   LOC: {RC arousal:34157}  Orientation: {RC orientation:34160}  Speech: {RC speech:34158}  Language: {RC language:34159}    Cranial nerves:   Pupils: {RC pupils:34163}  Corneal reflex: {RC corneal reflex:34164}  Gaze: {RC gaze:34165}  Face sensory: {RC face sensory:34166}  Face motor: {RC face motor:34167}  Vestibular: {RC vestibular:45663}  Cough: {RC cough:34170}  Gag: {RC gag:34171}  Tongue: {RC tongue:34172},     Motor:   RUE: {RC 0-5:34156}  LUE: {RC 0-5:34156}  RLE: {RC 0-5:34156}  LLE: {RC 0-5:34156}     Sensory: ***       BILLING     {RC billing:37369}    Scribe Statement: Documentation assistance was provided by me personally, Archie Patten a scribe. Alphonsa Gin, ACNP obtained and performed the history, physical exam and medical decision making elements that were entered into the chart. Signed by Archie Patten, 11/10/20 at 7:13 PM    Provider Attestation: Documentation assistance was provided by Archie Patten. I was present during the time the encounter was recorded. The information recorded by the Scribe was done at my direction and has been reviewed and validated by me. Signed by Alphonsa Gin, ACNP. 11/10/20 at 7:13 PM

## 2020-11-11 NOTE — Unmapped (Signed)
NEUROSURGERY PROGRESS NOTE    Brief History of Present Illness  Sheri [name] is a 25 y.o. female with triple negative breast cancer and multiple brain metastases who is now s/p left craniotomy for resection of mass (11/10/20)    Subjective/Interval History  Post op check    Interval Imaging Reviewed  none    Neurological Assessment and Plan  **Brain metastasis (CMS-HCC)  POD1, s/p left craniotomy  - SBP<180, Na>135  - keppra 500bid x7d  - dexamethasone 4q6, plan for taper   - MRI brain when able for post op baseline  - diet as tolerated  - PT/OT/SLP  - pain/nausea mgmt prn      Anticoagulant/Antiplatelet needs: na    Disposition: ICU    This patient is co-managed with the [facility]. The management of this patient was discussed in detail with them and we agree with their overall assessment and plan.      ___________________________________________________________________    Neurological Exam  EOSp  Ox3  PERRL   EOMI  F=  TML  RUE 5  LUE 5  RLE 5  LLE 5    Incision c/d/i with glue      The patient's vitals, intake/output, labs, orders, and relevant imaging were reviewed for the last 24 hours.    Problem List  Principal Problem:    Brain metastasis (CMS-HCC)  Active Problems:    Malignant neoplasm of upper-outer quadrant of left breast in female, estrogen receptor negative (CMS-HCC)  Resolved Problems:    * No resolved hospital problems. *

## 2020-11-11 NOTE — Unmapped (Signed)
Neuro intact except for some occasional word finding difficulties. Primary issue overnight has been achieving adequate pain control. Original post op analgesia orders were lower doses of oxycodone than patient was taking at home for oncological pain management. Regimen adjusted overnight. Patient has been able to rest. Discussed palliative or pain management consult for the morning with NP.     Problem: Adult Inpatient Plan of Care  Goal: Plan of Care Review  Outcome: Progressing  Goal: Patient-Specific Goal (Individualized)  Outcome: Progressing  Goal: Absence of Hospital-Acquired Illness or Injury  Outcome: Progressing  Intervention: Prevent Skin Injury  Recent Flowsheet Documentation  Taken 11/10/2020 2000 by Bretta Bang, RN  Skin Protection:   adhesive use limited   cleansing with dimethicone incontinence wipes   incontinence pads utilized   silicone foam dressing in place   skin-to-device areas padded   skin-to-skin areas padded   transparent dressing maintained   tubing/devices free from skin contact  Intervention: Prevent and Manage VTE (Venous Thromboembolism) Risk  Recent Flowsheet Documentation  Taken 11/10/2020 2000 by Bretta Bang, RN  VTE Prevention/Management:   ambulation promoted   bleeding precautions maintained   bleeding risk factors identified   dorsiflexion/plantar flexion performed   intravenous hydration   fluids promoted  Goal: Optimal Comfort and Wellbeing  Outcome: Progressing  Goal: Readiness for Transition of Care  Outcome: Progressing  Goal: Rounds/Family Conference  Outcome: Progressing     Problem: Impaired Wound Healing  Goal: Optimal Wound Healing  Outcome: Progressing     Problem: Self-Care Deficit  Goal: Improved Ability to Complete Activities of Daily Living  Outcome: Progressing     Problem: Skin Injury Risk Increased  Goal: Skin Health and Integrity  Outcome: Progressing  Intervention: Optimize Skin Protection  Recent Flowsheet Documentation  Taken 11/10/2020 2000 by Bretta Bang, RN  Pressure Reduction Techniques:   frequent weight shift encouraged   pressure points protected   weight shift assistance provided  Pressure Reduction Devices:   positioning supports utilized   pressure-redistributing mattress utilized  Skin Protection:   adhesive use limited   cleansing with dimethicone incontinence wipes   incontinence pads utilized   silicone foam dressing in place   skin-to-device areas padded   skin-to-skin areas padded   transparent dressing maintained   tubing/devices free from skin contact     Problem: Adjustment to Surgery (Craniotomy/Craniectomy/Cranioplasty)  Goal: Optimal Coping with Surgery  Outcome: Progressing     Problem: Bleeding (Craniotomy/Craniectomy/Cranioplasty)  Goal: Absence of Bleeding  Outcome: Progressing     Problem: Bowel Motility Impaired (Craniotomy/Craniectomy/Cranioplasty)  Goal: Effective Bowel Elimination  Outcome: Progressing     Problem: Risk for Infection (Craniotomy/Craniectomy/Cranioplasty)  Goal: Absence of Infection Signs and Symptoms  Outcome: Progressing     Problem: Pain (Craniotomy/Craniectomy/Cranioplasty)  Goal: Acceptable Pain Control  Outcome: Progressing     Problem: Pain Chronic (Persistent)  Goal: Acceptable Pain Control and Functional Ability  Outcome: Progressing

## 2020-11-12 LAB — BASIC METABOLIC PANEL
ANION GAP: 7 mmol/L (ref 5–14)
BLOOD UREA NITROGEN: <5 mg/dL — ABNORMAL LOW (ref 9–23)
CALCIUM: 8.7 mg/dL (ref 8.7–10.4)
CHLORIDE: 97 mmol/L — ABNORMAL LOW (ref 98–107)
CO2: 28 mmol/L (ref 20.0–31.0)
CREATININE: 0.29 mg/dL — ABNORMAL LOW
EGFR CKD-EPI (2021) FEMALE: >90 mL/min/{1.73_m2} (ref >=60)
GLUCOSE RANDOM: 135 mg/dL (ref 70–179)
POTASSIUM: 3.1 mmol/L — ABNORMAL LOW (ref 3.4–4.8)
SODIUM: 132 mmol/L — ABNORMAL LOW (ref 135–145)

## 2020-11-12 LAB — CBC
HEMATOCRIT: 31.2 % — ABNORMAL LOW (ref 34.0–44.0)
HEMOGLOBIN: 10.3 g/dL — ABNORMAL LOW (ref 11.3–14.9)
MEAN CORPUSCULAR HEMOGLOBIN CONC: 32.9 g/dL (ref 32.0–36.0)
MEAN CORPUSCULAR HEMOGLOBIN: 25.7 pg — ABNORMAL LOW (ref 25.9–32.4)
MEAN CORPUSCULAR VOLUME: 77.9 fL (ref 77.6–95.7)
MEAN PLATELET VOLUME: 6.7 fL — ABNORMAL LOW (ref 6.8–10.7)
PLATELET COUNT: 531 10*9/L — ABNORMAL HIGH (ref 150–450)
RED BLOOD CELL COUNT: 4 10*12/L (ref 3.95–5.13)
RED CELL DISTRIBUTION WIDTH: 18 % — ABNORMAL HIGH (ref 12.2–15.2)
WBC ADJUSTED: 8.8 10*9/L (ref 3.6–11.2)

## 2020-11-12 LAB — MAGNESIUM: MAGNESIUM: 1.7 mg/dL (ref 1.6–2.6)

## 2020-11-12 MED ADMIN — scopolamine (TRANSDERM-SCOP) 1 mg over 3 days topical patch 1.5 mg: 1 | TOPICAL | @ 04:00:00

## 2020-11-12 MED ADMIN — dexAMETHasone (DECADRON) tablet 4 mg: 4 mg | ORAL | @ 10:00:00 | Stop: 2020-11-13

## 2020-11-12 MED ADMIN — pantoprazole (PROTONIX) EC tablet 40 mg: 40 mg | ORAL | @ 12:00:00

## 2020-11-12 MED ADMIN — levETIRAcetam (KEPPRA) tablet 500 mg: 500 mg | ORAL | Stop: 2020-11-17

## 2020-11-12 MED ADMIN — oxyCODONE (ROXICODONE) immediate release tablet 10 mg: 10 mg | ORAL | Stop: 2020-11-11

## 2020-11-12 MED ADMIN — dexAMETHasone (DECADRON) tablet 4 mg: 4 mg | ORAL | @ 04:00:00 | Stop: 2020-11-13

## 2020-11-12 MED ADMIN — HYDROmorphone (PF) (DILAUDID) injection 1 mg: 1 mg | INTRAVENOUS | @ 14:00:00 | Stop: 2020-11-12

## 2020-11-12 MED ADMIN — HYDROmorphone (DILAUDID) injection 1 mg: 1 mg | INTRAVENOUS | @ 01:00:00 | Stop: 2020-11-11

## 2020-11-12 MED ADMIN — bisacodyL (DULCOLAX) suppository 10 mg: 10 mg | RECTAL | @ 15:00:00

## 2020-11-12 MED ADMIN — oxyCODONE (ROXICODONE) immediate release tablet 10 mg: 10 mg | ORAL | @ 12:00:00 | Stop: 2020-11-25

## 2020-11-12 MED ADMIN — sertraline (ZOLOFT) tablet 200 mg: 200 mg | ORAL | @ 12:00:00

## 2020-11-12 MED ADMIN — acetaminophen (TYLENOL) tablet 650 mg: 650 mg | ORAL | @ 06:00:00 | Stop: 2020-11-12

## 2020-11-12 MED ADMIN — dexAMETHasone (DECADRON) tablet 4 mg: 4 mg | ORAL | @ 23:00:00 | Stop: 2020-11-13

## 2020-11-12 MED ADMIN — oxyCODONE (ROXICODONE) immediate release tablet 10 mg: 10 mg | ORAL | @ 17:00:00 | Stop: 2020-11-25

## 2020-11-12 MED ADMIN — iohexoL (OMNIPAQUE) 350 mg iodine/mL solution 90 mL: 90 mL | INTRAVENOUS | @ 02:00:00 | Stop: 2020-11-11

## 2020-11-12 MED ADMIN — oxyCODONE (ROXICODONE) immediate release tablet 30 mg: 30 mg | ORAL | @ 06:00:00 | Stop: 2020-11-16

## 2020-11-12 MED ADMIN — oxyCODONE (ROXICODONE) immediate release tablet 30 mg: 30 mg | ORAL | @ 19:00:00 | Stop: 2020-11-16

## 2020-11-12 MED ADMIN — ferrous sulfate tablet 325 mg: 325 mg | ORAL | @ 12:00:00

## 2020-11-12 MED ADMIN — oxyCODONE (ROXICODONE) immediate release tablet 10 mg: 10 mg | ORAL | @ 04:00:00 | Stop: 2020-11-25

## 2020-11-12 MED ADMIN — potassium chloride 10 mEq in 100 mL IVPB: 10 meq | INTRAVENOUS | @ 16:00:00 | Stop: 2020-11-12

## 2020-11-12 MED ADMIN — insulin regular (HumuLIN,NovoLIN) injection 0-20 Units: 0-20 [IU] | SUBCUTANEOUS | @ 21:00:00

## 2020-11-12 MED ADMIN — oxyCODONE (ROXICODONE) immediate release tablet 10 mg: 10 mg | ORAL | @ 21:00:00 | Stop: 2020-11-25

## 2020-11-12 MED ADMIN — levETIRAcetam (KEPPRA) tablet 500 mg: 500 mg | ORAL | @ 12:00:00 | Stop: 2020-11-17

## 2020-11-12 MED ADMIN — senna (SENOKOT) tablet 2 tablet: 2 | ORAL | @ 12:00:00

## 2020-11-12 MED ADMIN — oxyCODONE (ROXICODONE) immediate release tablet 30 mg: 30 mg | ORAL | @ 02:00:00 | Stop: 2020-11-16

## 2020-11-12 MED ADMIN — dexAMETHasone (DECADRON) tablet 4 mg: 4 mg | ORAL | @ 15:00:00 | Stop: 2020-11-13

## 2020-11-12 MED ADMIN — acetaminophen (TYLENOL) tablet 650 mg: 650 mg | ORAL | Stop: 2020-11-12

## 2020-11-12 MED ADMIN — QUEtiapine (SEROquel) tablet 25 mg: 25 mg | ORAL | @ 06:00:00

## 2020-11-12 MED ADMIN — oxyCODONE (ROXICODONE) immediate release tablet 30 mg: 30 mg | ORAL | @ 23:00:00 | Stop: 2020-11-16

## 2020-11-12 MED ADMIN — oxyCODONE (ROXICODONE) immediate release tablet 30 mg: 30 mg | ORAL | @ 10:00:00 | Stop: 2020-11-16

## 2020-11-12 MED ADMIN — prochlorperazine (COMPAZINE) 5 mg in sodium chloride (NS) 0.9 % 25 mL IVPB: 5 mg | INTRAVENOUS | @ 15:00:00 | Stop: 2020-11-12

## 2020-11-12 MED ADMIN — oxyCODONE (ROXICODONE) immediate release tablet 30 mg: 30 mg | ORAL | @ 15:00:00 | Stop: 2020-11-16

## 2020-11-12 NOTE — Unmapped (Signed)
Pt has been constantly asking for pain meds, ends up crying and screaming inside the room, her pain is not relieve with current regimen, MD made aware. No falls reported, safety precautions implemented, family at bedside, encouraged to call for assistance when needed.    Problem: Adult Inpatient Plan of Care  Goal: Plan of Care Review  Outcome: Ongoing - Unchanged  Goal: Patient-Specific Goal (Individualized)  Outcome: Ongoing - Unchanged  Goal: Absence of Hospital-Acquired Illness or Injury  Outcome: Ongoing - Unchanged  Intervention: Identify and Manage Fall Risk  Recent Flowsheet Documentation  Taken 11/11/2020 2000 by Farrel Demark, RN  Safety Interventions:   fall reduction program maintained   family at bedside   low bed  Goal: Optimal Comfort and Wellbeing  Outcome: Ongoing - Unchanged  Goal: Readiness for Transition of Care  Outcome: Ongoing - Unchanged  Goal: Rounds/Family Conference  Outcome: Ongoing - Unchanged     Problem: Impaired Wound Healing  Goal: Optimal Wound Healing  Outcome: Ongoing - Unchanged     Problem: Self-Care Deficit  Goal: Improved Ability to Complete Activities of Daily Living  Outcome: Ongoing - Unchanged     Problem: Skin Injury Risk Increased  Goal: Skin Health and Integrity  Outcome: Ongoing - Unchanged     Problem: Pain (Craniotomy/Craniectomy/Cranioplasty)  Goal: Acceptable Pain Control  Outcome: Ongoing - Unchanged     Problem: Pain Chronic (Persistent)  Goal: Acceptable Pain Control and Functional Ability  Outcome: Ongoing - Unchanged

## 2020-11-12 NOTE — Unmapped (Signed)
NEUROSURGERY PROGRESS NOTE    Brief History of Present Illness  Sheri [name] is a 25 y.o. female with triple negative breast cancer and multiple brain metastases who is now s/p left craniotomy for resection of mass (11/10/20)    Subjective/Interval History  Stable on the floor overnight. Needs to have a bowel movement today. Got her imaging today. Ordered repeat EKG to follow up EKG. Plan to discharge this weekend.    Interval Imaging Reviewed  MRI Brain: pending upload   CT AP: read pending  CT Chest: Increased size of pulmonary nodules and T2 vertebral body     Neurological Assessment and Plan  **Brain metastasis (CMS-HCC)  POD2, s/p left craniotomy  - SBP<180, Na>135  - keppra 500bid x7d  - dexamethasone 4q6, 2 day taper   - MRI brain complete  - diet as tolerated  - PT/OT/SLP  - pain/nausea mgmt prn    **Electrolytes  - repleted K  - check daily and replete as needed    Anticoagulant/Antiplatelet needs: na    Daily Maintenance:  VTE chemoprophylaxis: lovenox  IV Fluids: none  Foley status: none  Nutrition: regular  Bowel regimen: senna and miralax    Disposition: floor     ___________________________________________________________________    Neurological Exam  EOSp  Ox3  PERRL   EOMI  F=  TML  RUE 5  LUE 5  RLE 5  LLE 5    Incision c/d/i with glue      The patient's vitals, intake/output, labs, orders, and relevant imaging were reviewed for the last 24 hours.    Problem List  Principal Problem:    Brain metastasis (CMS-HCC)  Active Problems:    Malignant neoplasm of upper-outer quadrant of left breast in female, estrogen receptor negative (CMS-HCC)  Resolved Problems:    * No resolved hospital problems. *

## 2020-11-13 LAB — CBC
HEMATOCRIT: 33.9 % — ABNORMAL LOW (ref 34.0–44.0)
HEMOGLOBIN: 11 g/dL — ABNORMAL LOW (ref 11.3–14.9)
MEAN CORPUSCULAR HEMOGLOBIN CONC: 32.5 g/dL (ref 32.0–36.0)
MEAN CORPUSCULAR HEMOGLOBIN: 25.2 pg — ABNORMAL LOW (ref 25.9–32.4)
MEAN CORPUSCULAR VOLUME: 77.6 fL (ref 77.6–95.7)
MEAN PLATELET VOLUME: 6.9 fL (ref 6.8–10.7)
PLATELET COUNT: 605 10*9/L — ABNORMAL HIGH (ref 150–450)
RED BLOOD CELL COUNT: 4.37 10*12/L (ref 3.95–5.13)
RED CELL DISTRIBUTION WIDTH: 17.9 % — ABNORMAL HIGH (ref 12.2–15.2)
WBC ADJUSTED: 9.7 10*9/L (ref 3.6–11.2)

## 2020-11-13 LAB — BASIC METABOLIC PANEL
ANION GAP: 9 mmol/L (ref 5–14)
BLOOD UREA NITROGEN: 6 mg/dL — ABNORMAL LOW (ref 9–23)
BUN / CREAT RATIO: 18
CALCIUM: 8.7 mg/dL (ref 8.7–10.4)
CHLORIDE: 96 mmol/L — ABNORMAL LOW (ref 98–107)
CO2: 28 mmol/L (ref 20.0–31.0)
CREATININE: 0.33 mg/dL — ABNORMAL LOW
EGFR CKD-EPI (2021) FEMALE: >90 mL/min/{1.73_m2} (ref >=60)
GLUCOSE RANDOM: 142 mg/dL (ref 70–179)
POTASSIUM: 3.2 mmol/L — ABNORMAL LOW (ref 3.4–4.8)
SODIUM: 133 mmol/L — ABNORMAL LOW (ref 135–145)

## 2020-11-13 LAB — MAGNESIUM: MAGNESIUM: 1.9 mg/dL (ref 1.6–2.6)

## 2020-11-13 LAB — PHOSPHORUS: PHOSPHORUS: 2.8 mg/dL (ref 2.4–5.1)

## 2020-11-13 MED ORDER — DEXAMETHASONE 1 MG TABLET
ORAL_TABLET | ORAL | 0 refills | 12.00 days | Status: CP
Start: 2020-11-13 — End: 2020-11-25

## 2020-11-13 MED ORDER — LEVETIRACETAM 500 MG TABLET
ORAL_TABLET | Freq: Two times a day (BID) | ORAL | 0 refills | 4.00 days | Status: CP
Start: 2020-11-13 — End: 2020-11-17

## 2020-11-13 MED ORDER — PROCHLORPERAZINE MALEATE 10 MG TABLET
ORAL_TABLET | Freq: Four times a day (QID) | ORAL | 2 refills | 8.00 days | Status: CP | PRN
Start: 2020-11-13 — End: 2020-12-13

## 2020-11-13 MED ADMIN — gadobenate dimeglumine (MULTIHANCE) 529 mg/mL (0.1mmol/0.2mL) solution 20 mL: 20 mL | INTRAVENOUS | @ 02:00:00 | Stop: 2020-11-12

## 2020-11-13 MED ADMIN — dexAMETHasone (DECADRON) tablet 4 mg: 4 mg | ORAL | @ 11:00:00 | Stop: 2020-11-13

## 2020-11-13 MED ADMIN — potassium chloride 10 mEq in 100 mL IVPB: 10 meq | INTRAVENOUS | @ 11:00:00 | Stop: 2020-11-13

## 2020-11-13 MED ADMIN — oxyCODONE (ROXICODONE) immediate release tablet 30 mg: 30 mg | ORAL | @ 07:00:00 | Stop: 2020-11-13

## 2020-11-13 MED ADMIN — oxyCODONE (ROXICODONE) immediate release tablet 10 mg: 10 mg | ORAL | @ 04:00:00 | Stop: 2020-11-13

## 2020-11-13 MED ADMIN — oxyCODONE (ROXICODONE) immediate release tablet 30 mg: 30 mg | ORAL | @ 01:00:00 | Stop: 2020-11-16

## 2020-11-13 MED ADMIN — LORazepam (ATIVAN) tablet 1 mg: 1 mg | ORAL | @ 09:00:00 | Stop: 2020-11-13

## 2020-11-13 MED ADMIN — pantoprazole (PROTONIX) EC tablet 40 mg: 40 mg | ORAL | @ 14:00:00 | Stop: 2020-11-13

## 2020-11-13 MED ADMIN — oxyCODONE (ROXICODONE) immediate release tablet 30 mg: 30 mg | ORAL | @ 17:00:00 | Stop: 2020-11-13

## 2020-11-13 MED ADMIN — oxyCODONE (ROXICODONE) immediate release tablet 30 mg: 30 mg | ORAL | @ 14:00:00 | Stop: 2020-11-13

## 2020-11-13 MED ADMIN — oxyCODONE (ROXICODONE) immediate release tablet 30 mg: 30 mg | ORAL | @ 11:00:00 | Stop: 2020-11-13

## 2020-11-13 MED ADMIN — dexAMETHasone (DECADRON) tablet 4 mg: 4 mg | ORAL | @ 04:00:00 | Stop: 2020-11-13

## 2020-11-13 MED ADMIN — senna (SENOKOT) tablet 2 tablet: 2 | ORAL | @ 14:00:00 | Stop: 2020-11-13

## 2020-11-13 MED ADMIN — sertraline (ZOLOFT) tablet 200 mg: 200 mg | ORAL | @ 14:00:00 | Stop: 2020-11-13

## 2020-11-13 MED ADMIN — levETIRAcetam (KEPPRA) tablet 500 mg: 500 mg | ORAL | @ 01:00:00 | Stop: 2020-11-17

## 2020-11-13 MED ADMIN — heparin, porcine (PF) 100 unit/mL injection: @ 18:00:00 | Stop: 2020-11-13

## 2020-11-13 MED ADMIN — melatonin tablet 3 mg: 3 mg | ORAL | @ 01:00:00

## 2020-11-13 MED ADMIN — dexAMETHasone (DECADRON) tablet 4 mg: 4 mg | ORAL | @ 17:00:00 | Stop: 2020-11-13

## 2020-11-13 MED ADMIN — levETIRAcetam (KEPPRA) tablet 500 mg: 500 mg | ORAL | @ 14:00:00 | Stop: 2020-11-13

## 2020-11-13 MED ADMIN — oxyCODONE (ROXICODONE) immediate release tablet 10 mg: 10 mg | ORAL | @ 09:00:00 | Stop: 2020-11-13

## 2020-11-13 MED ADMIN — ferrous sulfate tablet 325 mg: 325 mg | ORAL | @ 14:00:00 | Stop: 2020-11-13

## 2020-11-13 MED ADMIN — QUEtiapine (SEROquel) tablet 25 mg: 25 mg | ORAL | @ 01:00:00

## 2020-11-13 MED ADMIN — enoxaparin (LOVENOX) syringe 40 mg: 40 mg | SUBCUTANEOUS | @ 01:00:00
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
## 2020-11-13 NOTE — Unmapped (Signed)
Pain has been uncontrolled. Given all scheduled pain medications and gave prn meds Q4. Remains free from falls and no signs of new skin breakdown. Still waiting to go down for MRI. Had one episode of N/V this morning, gave a one time dose of compazine with relief. Encouraging participation in ADL's and encouraging PO intake. Parents at bedside and are requesting an update on the place of care from [facility] team. Will continue care.  Problem: Adult Inpatient Plan of Care  Goal: Plan of Care Review  Outcome: Ongoing - Unchanged  Goal: Patient-Specific Goal (Individualized)  Outcome: Ongoing - Unchanged  Goal: Absence of Hospital-Acquired Illness or Injury  Outcome: Ongoing - Unchanged  Goal: Optimal Comfort and Wellbeing  Outcome: Ongoing - Unchanged  Goal: Readiness for Transition of Care  Outcome: Ongoing - Unchanged  Goal: Rounds/Family Conference  Outcome: Ongoing - Unchanged     Problem: Impaired Wound Healing  Goal: Optimal Wound Healing  Outcome: Ongoing - Unchanged     Problem: Self-Care Deficit  Goal: Improved Ability to Complete Activities of Daily Living  Outcome: Ongoing - Unchanged     Problem: Skin Injury Risk Increased  Goal: Skin Health and Integrity  Outcome: Ongoing - Unchanged     Problem: Adjustment to Surgery (Craniotomy/Craniectomy/Cranioplasty)  Goal: Optimal Coping with Surgery  Outcome: Ongoing - Unchanged     Problem: Bleeding (Craniotomy/Craniectomy/Cranioplasty)  Goal: Absence of Bleeding  Outcome: Ongoing - Unchanged     Problem: Bowel Motility Impaired (Craniotomy/Craniectomy/Cranioplasty)  Goal: Effective Bowel Elimination  Outcome: Ongoing - Unchanged     Problem: Risk for Infection (Craniotomy/Craniectomy/Cranioplasty)  Goal: Absence of Infection Signs and Symptoms  Outcome: Ongoing - Unchanged     Problem: Pain (Craniotomy/Craniectomy/Cranioplasty)  Goal: Acceptable Pain Control  Outcome: Ongoing - Unchanged     Problem: Pain Chronic (Persistent)  Goal: Acceptable Pain Control and Functional Ability  Outcome: Ongoing - Unchanged

## 2020-11-13 NOTE — Unmapped (Signed)
NEUROSURGERY PROGRESS NOTE    Brief History of Present Illness  Sheri [name] is a 25 y.o. female with triple negative breast cancer and multiple brain metastases who is now s/p left craniotomy for resection of mass (11/10/20)    Subjective/Interval History  Stable on the floor overnight. Had BM yesterday. Was able to tolerate her MRI last night. Continued nausea and pain. Started ativan prn per palliative. Plan for DC later today vs tomorrow.      Interval Imaging Reviewed  CT AP: increased size of T12 vertebral body lytic lesion and lytic lesion in fifth rib with 2 cm soft tissue nodule  MRI Brain: expected postoperative changes with area of enhancement along lateral resection site. Unchanged edema and additional lesions.    Neurological Assessment and Plan  **Brain metastasis (CMS-HCC)  POD3, s/p left craniotomy  - SBP<180, Na>135  - keppra 500bid x7d  - dexamethasone 4q6, 2 day taper   - MRI brain complete  - diet as tolerated  - PT/OT/SLP  - pain/nausea mgmt prn    **Electrolytes  - repleted K  - check daily and replete as needed    Anticoagulant/Antiplatelet needs: na    Daily Maintenance:  VTE chemoprophylaxis: lovenox  IV Fluids: none  Foley status: none  Nutrition: regular  Bowel regimen: senna and miralax    Disposition: floor     ___________________________________________________________________    Neurological Exam  EOSp  Ox3  PERRL   EOMI  F=  TML  RUE 5  LUE 5  RLE 5  LLE 5    Incision c/d/i with glue      The patient's vitals, intake/output, labs, orders, and relevant imaging were reviewed for the last 24 hours.    Problem List  Principal Problem:    Brain metastasis (CMS-HCC)  Active Problems:    Malignant neoplasm of upper-outer quadrant of left breast in female, estrogen receptor negative (CMS-HCC)  Resolved Problems:    * No resolved hospital problems. *

## 2020-11-13 NOTE — Unmapped (Signed)
Please schedule this patient for an appointment as noted below.  Please contact the patient as needed/appropriate to alert them of this appointment.    Provider: Derrill Memo, NP    Date or Time Frame: 2 weeks    Reason for Visit: wound check    Imaging Needed: none

## 2020-11-13 NOTE — Unmapped (Signed)
Oncology Consult Follow Up Note    Attending Physician: Odie Sera, MD  Service Requesting Consult : Neurosurgery [facility])  Reason for Consult: Metastatic breast cancer - new brain mets  Primary Oncologist: Dr. Marc Morgans    ASSESSMENT and PLAN      Sheri [name] is a 80F with IDC of Left Breast (BRCA1, ER+, PR-, HER2-) with known metastasis (lung, bone) on adjuvant capecitabine who was admitted for headaches and outpatient head imaging highly c/f metastatic disease. Oncology was consulted for continuity of care.     Now s/p L frontal craniectomy for resection of 1.8x1.5cm dominant L frontal lobe mass, pathology report pending. MRI Brain demonstrates multiple T1 hypoenhancing lesions with vasogenic edema in addition to the L frontal lobe mass, now s/p resection. Spoke with radiation oncology, per their note 11/09/20 plan for follow-up in clinic after discharge regarding adjuvant radiation therapy. Her R rib lesion may be causing her pain over her R flank; discussed with their resident who will notify their planning team regarding this for consideration of radiation therapy. Back pain but nontender to palpation over spine, less likely 2/2 T12 lesion.    CT C/A/P with increase in size of pulmonary and mediastinal metastases, increased size of T12 lytic vertebral body lesion, and new lytic lesions of the R 6th and L 8th ribs. I discussed the findings with the patient and her family. Her R rib lesion may be causing her pain over her R flank; discussed with their resident who will notify their planning team regarding this for consideration of radiation therapy. Back pain but nontender to palpation over spine, less likely 2/2 T12 lesion. She currently has scheduled follow-up with Heloise Purpura, PA on 11/25/20, to discuss next steps.  ??  Recommendations:   - Per NSGY team, plan to discharge today.  - Follow-up scheduled with Heloise Purpura, PA on 11/25/20  - CT C/A/P completed in hospital, NM bone scan to be completed in the outpatient setting.  - Radiation oncology aware of discharge plan, to arrange follow-up  ??  This patient has been staffed with Dr. Lendell Caprice These recommendations were discussed with the primary team.     Please contact the oncology team at [phone] with any further questions.    Modesto Charon, MD  Hematology/Oncology, PGY4    INTERVAL HISTORY     - S/p L frontal craniectomy for resection of 1.8x1.5cm dominant L frontal lobe mass, pathology report pending  - Has recovered postoperatively well enough for primary team to plan to discharge her home today  - CT C/A/P staging scans completed, reports as below, demonstrate increase in size of pulmonary and mediastinal metastases, increased size of T12 lytic vertebral body lesion, and new lytic lesions of the R 6th and L 8th ribs.  - Patient endorses back pain but attributes it to her lying in bed  - Endorses pain over her R lower flank in the area of the R 6th rib lesion.      MEDICATIONS and ALLERGIES     No current facility-administered medications on file prior to encounter.     Current Outpatient Medications on File Prior to Encounter   Medication Sig Dispense Refill   ??? acetaminophen (TYLENOL) 500 MG tablet Take 1,000 mg by mouth every eight (8) hours.     ??? benzonatate (TESSALON PERLES) 100 MG capsule Take 1 capsule (100 mg total) by mouth Three (3) times a day as needed for cough. 60 capsule 1   ??? capecitabine (  XELODA) 500 MG tablet Take 4 tablets (2,000 mg total) by mouth Two (2) times a day for 14 days . Followed by 7 days off drug. 112 tablet 0   ??? ergocalciferol-1,250 mcg, 50,000 unit, (VITAMIN D2-1,250 MCG, 50,000 UNIT,) 1,250 mcg (50,000 unit) capsule Take 1 capsule (1,250 mcg total) by mouth once a week. 4 capsule 2   ??? famotidine (PEPCID) 20 MG tablet Take 1 tablet (20 mg total) by mouth Two (2) times a day. 60 tablet 11   ??? loperamide (IMODIUM) 2 mg capsule Take 2 mg by mouth 4 (four) times a day as needed.      ??? naproxen (NAPROSYN) 500 MG tablet Take 1 tablet (500 mg total) by mouth in the morning and 1 tablet (500 mg total) in the evening. Take with meals. 60 tablet 2   ??? ondansetron (ZOFRAN-ODT) 4 MG disintegrating tablet Dissolve 1 tablet (4 mg total) on the tongue every eight (8) hours as needed for nausea for up to 30 doses. 30 tablet 1   ??? oxyCODONE (ROXICODONE) 15 MG immediate release tablet Take 1-2 tablets (15-30mg ) every 4-6 hours as needed for cancer related pain. 140 tablet 0   ??? pantoprazole (PROTONIX) 40 MG tablet Take 1 tablet (40 mg total) by mouth daily. 90 tablet 3   ??? senna (SENNA) 8.6 mg tablet Take 1-2 tablets by mouth two (2) times a day as needed for constipation. 120 tablet 11   ??? sertraline (ZOLOFT) 100 MG tablet Take 2 tablets (200 mg total) by mouth daily. 60 tablet 2   ??? ferrous sulfate 325 (65 FE) MG tablet Take 1 tablet (325 mg total) by mouth daily. Take with orange juice. 30 tablet 3   ??? lidocaine-prilocaine (EMLA) 2.5-2.5 % cream Apply topically once as needed for up to 1 dose. Apply small amount to port site 30 minutes - 1 hour prior to access. 30 g 0   ??? naloxone (NARCAN) 4 mg nasal spray One spray in either nostril once for known/suspected opioid overdose. May repeat every 2-3 minutes in alternating nostril til EMS arrives 2 each 1       PHYSICAL EXAM:      Vitals:    11/13/20 1100   BP:    Pulse:    Resp:    Temp: 37.1 ??C (98.8 ??F)   SpO2:      General: Laying in bed, smiling, conversive, in NAD  HEENT: PERRL, EOMI, OP clear, MMM  Neck: No cervical or supraclavicular LAD  Heart: RRR, no m/r/g  Chest: Breathing comfortably on RA, CTAB, no wrr. TTP over side on R lower chest wall.  Back: Nontender to palpation throughout the midline or paraspinal regions.  Abd: Soft, non-tender, nondistended  Ext: No BLE edema present  Neuro: EOMI, face symmetric, symmetric palate elevation, 5/5 BUE and BLE strength.      Patient Lines/Drains/Airways Status     Active Peripheral & Central Intravenous Access     Name Placement date Placement time Site Days    Power Port--a-Cath Single Hub 12/18/17 Right Chest 12/18/17  1103  Chest  1061                LABORATORY and RADIOLOGY DATA:      Pertinent Laboratory Data:  Lab Results   Component Value Date    WBC 9.7 11/13/2020    HGB 11.0 (L) 11/13/2020    HCT 33.9 (L) 11/13/2020    PLT 605 (H) 11/13/2020     Lab Results  Component Value Date    NA 133 (L) 11/13/2020    K 3.2 (L) 11/13/2020    CL 96 (L) 11/13/2020    CO2 28.0 11/13/2020     Lab Results   Component Value Date    CALCIUM 8.7 11/13/2020    PHOS 2.8 11/13/2020     Lab Results   Component Value Date    CREATININE 0.33 (L) 11/13/2020     Lab Results   Component Value Date    ALKPHOS 164 (H) 11/09/2020    BILITOT 0.3 11/09/2020    BILIDIR <0.10 10/21/2018    PROT 7.4 11/09/2020    ALBUMIN 3.1 (L) 11/09/2020    ALT <7 (L) 11/09/2020    AST 8 11/09/2020     Lab Results   Component Value Date    PT 14.2 (H) 11/10/2020    INR 1.25 11/10/2020       Pertinent Imaging:  MRI Brain 11/12/20  - Sequelae of left craniotomy for mass resection. Thin nonspecific enhancement along the lateral margin of the resection site. Recommend additional follow-up.  - Similar degree of surrounding vasogenic edema and mass effect with 6 mm rightward midline shift.  - Multiple additional enhancing metastatic lesions are unchanged.    CT Chest w Contrast 11/11/20  -Enlargement of pulmonary nodules/masses compared to chest CT 09/08/2020.  -Stable mediastinal and right hilar lymphadenopathy.  -Increased size of T12 vertebral body lytic lesion.  -New lytic lesions right sixth and left eighth rib as discussed.    CT A/P w Contrast 11/11/20  1. Increased size of T12 vertebral body lytic lesion and interval development of new lytic lesion in the anterior right fifth rib with associated 2.1 cm soft tissue nodule, concerning for new sites of metastatic disease.  2. Hepatosplenomegaly.  3. Diffuse cortical thickening of the bilateral lateral iliac cortices and proximal femoral metaphyses which may be the basis of hypertrophic osteoarthropathy.

## 2020-11-13 NOTE — Unmapped (Signed)
NEUROSURGERY DISCHARGE SUMMARY    Identifying Information:   Sheri [name]  09-17-1995  [id number]    Admit date: 11/08/2020    Discharge date: 11/13/2020     Discharge Service: Neurosurgery [facility])    Discharge Attending Physician: Odie Sera, MD    Discharge to: Home    Discharge Diagnoses:  Principal Problem:    Brain metastasis (CMS-HCC)  Active Problems:    Malignant neoplasm of upper-outer quadrant of left breast in female, estrogen receptor negative (CMS-HCC)  Resolved Problems:    * No resolved hospital problems. [facility] Course:   Sheri [name] is a 25 y.o. female with a history of L??breast??cancer??ER+ (5%), PR-, HER2-, metastatic to lung, hypertrophic osteoarthropathy, and PTSD who presented with 1 week of HA, nausea and vomiting and was found on MRI brain to have new metastases, the largest of which was left frontal. She was seen and consented for surgical intervention after discussing the risks, benefits, and alternatives in full detail.    The patient was taken to the OR on 9/15 for a left frontal craniotomy for resection of her mass.  She tolerated the procedure well, was extubated in the OR, and was taken to the NSICU for further neuromonitoring and was then eventually transferred to the floor. She did well postoperatively. Her diet was slowly advanced and at the time of discharge she was tolerating a regular diet. The patient was able to void spontaneously, have her pain controlled with P.O. pain medication, and returned to the preoperative ambulatory status. The palliatice care team was involved in assisting with her pain regimen. Oncology and radiation oncology were consulted for continuity of care and will schedule follow-up with her. She was evaluated by Physical and Occupational Therapy and found to have 3x weekly needs at home.  She will be discharged home on POD3 in stable condition.      Follow-up:  1. 2 week postop Neurosurgery clinic appointment for wound care with Derrill Memo  2. Onc and rad onc follow-up to be scheduled    Procedures:     1.  Left frontal craniotomy for resection of brain metastasis    2.  Stereotactic navigation    Pathology:   Pending    Discharge Day Services:   The patient was seen and evaluated by the Neurosurgery team on the day of discharge and deemed stable for discharge, including stable labs, vital signs, radiographic studies, and neurologic exam.  Discharge instructions were given and explained.  Questions were answered.    Physical Exam:  General: No acute distress  Cardiovascular: Hemodynamically stable  Pulmonary: Breathing is unlabored  Neurologic:   EOSp  Ox3  PERRL   EOMI  F=  TML  RUE 5  LUE 5  RLE 5  LLE 5  ??  Incision c/d/i with glue  ??    _____________________________________________________________________________    Temp:  [36.5 ??C (97.7 ??F)-37.2 ??C (98.9 ??F)] 37.1 ??C (98.8 ??F)  Heart Rate:  [72-78] 73  Resp:  [18-20] 18  BP: (110-124)/(53-70) 115/59  MAP (mmHg):  [70-83] 82  SpO2:  [94 %-98 %] 97 %      Condition at Discharge:   Stable  _____________________________________________________________________________  Discharge Medications:     Your Medication List      START taking these medications    dexAMETHasone 1 MG tablet  Commonly known as: DECADRON  Take 4 tablets (4 mg total) by mouth every eight (8) hours for 2 days, THEN  4 tablets (4 mg total) two (2) times a day for 2 days, THEN 4 tablets (4 mg total) daily for 2 days, THEN 2 tablets (2 mg total) daily for 2 days, THEN 1 tablet (1 mg total) daily for 2 days, THEN 0.5 tablets (0.5 mg total) daily for 2 days.  Start taking on: November 13, 2020     levETIRAcetam 500 MG tablet  Commonly known as: KEPPRA  Take 1 tablet (500 mg total) by mouth Two (2) times a day for 4 days.        CONTINUE taking these medications    acetaminophen 500 MG tablet  Commonly known as: TYLENOL  Take 1,000 mg by mouth every eight (8) hours.     benzonatate 100 MG capsule  Commonly known as: TESSALON PERLES  Take 1 capsule (100 mg total) by mouth Three (3) times a day as needed for cough.     capecitabine 500 MG tablet  Commonly known as: XELODA  Take 4 tablets (2,000 mg total) by mouth Two (2) times a day for 14 days . Followed by 7 days off drug.     ergocalciferol-1,250 mcg (50,000 unit) 1,250 mcg (50,000 unit) capsule  Commonly known as: VITAMIN D2-1,250 mcg (50,000 unit)  Take 1 capsule (1,250 mcg total) by mouth once a week.     famotidine 20 MG tablet  Commonly known as: PEPCID  Take 1 tablet (20 mg total) by mouth Two (2) times a day.     FeroSuL 325 (65 FE) MG tablet  Generic drug: ferrous sulfate  Take 1 tablet (325 mg total) by mouth daily. Take with orange juice.     lidocaine-prilocaine 2.5-2.5 % cream  Commonly known as: EMLA  Apply topically once as needed for up to 1 dose. Apply small amount to port site 30 minutes - 1 hour prior to access.     loperamide 2 mg capsule  Commonly known as: IMODIUM  Take 2 mg by mouth 4 (four) times a day as needed.     naloxone 4 mg/actuation nasal spray  Commonly known as: NARCAN  One spray in either nostril once for known/suspected opioid overdose. May repeat every 2-3 minutes in alternating nostril til EMS arrives     naproxen 500 MG tablet  Commonly known as: NAPROSYN  Take 1 tablet (500 mg total) by mouth in the morning and 1 tablet (500 mg total) in the evening. Take with meals.     ondansetron 4 MG disintegrating tablet  Commonly known as: ZOFRAN-ODT  Dissolve 1 tablet (4 mg total) on the tongue every eight (8) hours as needed for nausea for up to 30 doses.     oxyCODONE 15 MG immediate release tablet  Commonly known as: ROXICODONE  Take 1-2 tablets (15-30mg ) every 4-6 hours as needed for cancer related pain.     pantoprazole 40 MG tablet  Commonly known as: PROTONIX  Take 1 tablet (40 mg total) by mouth daily.     prochlorperazine 10 MG tablet  Commonly known as: COMPAZINE  Take 1 tablet (10 mg total) by mouth every six (6) hours as needed (nausea).     senna 8.6 mg tablet  Commonly known as: SENNA  Take 1-2 tablets by mouth two (2) times a day as needed for constipation.     sertraline 100 MG tablet  Commonly known as: ZOLOFT  Take 2 tablets (200 mg total) by mouth daily.          _____________________________________________________________________________  Pending  Test Results (if blank, then none):  Pending Labs     Order Current Status    Surgical pathology exam In process          Most Recent Labs:  Microbiology Results (last day)     ** No results found for the last 24 hours. **          Lab Results   Component Value Date    WBC 9.7 11/13/2020    HGB 11.0 (L) 11/13/2020    HCT 33.9 (L) 11/13/2020    PLT 605 (H) 11/13/2020       Lab Results   Component Value Date    NA 133 (L) 11/13/2020    K 3.2 (L) 11/13/2020    CL 96 (L) 11/13/2020    CO2 28.0 11/13/2020    BUN 6 (L) 11/13/2020    CREATININE 0.33 (L) 11/13/2020    CALCIUM 8.7 11/13/2020    MG 1.9 11/13/2020    PHOS 2.8 11/13/2020       _____________________________________________________________________________  Discharge Instructions:   Activity Instructions     Discharge activity      Activity:   Your muscles may be stiff or sore. Walking and moving around will help with both pain and stiffness. You will likely experience more fatigue for several days after surgery due to the medication used to put you to sleep. Try to remain active and awake during the day so that you are tired at bedtime and can get a full night???s rest. Avoid strenuous exercise for the first 2 weeks. Walking is ok. Do not do any bending, twisting, lifting over 10 lbs until cleared by the Neurosurgeon. Avoid any activity that puts you at risk for blows to the head.          Diet Instructions     Discharge Nutrition Therapy      Discharge Nutrition Therapy: Regular    Please continue a regular diet with no restrictions.               Other Instructions     Discharge call MD      Things to look out for:  -Pain- please call the Neurosurgery resident on-call for any worsening pain unrelieved with prescribed medications.  -Fever- If you have a fever over 101.5 F, call the Neurosurgery resident on-call. A low grade fever is normal for some people after surgery.  -Redness, drainage, odor, bleeding- If your incision is red, draining, has a bad odor, or has increasing bleeding, contact the Neurosurgery office during business hours or the Neurosurgery resident on-call after hours. There may be some normal swelling and pink-coloring around the wound. There may also be some fluid under the skin. It can take several weeks to months to be reabsorbed.  -Muscle strength- If an arm or leg becomes weak, call the Neurosurgery resident on     -Call immediately for the following:  -Bowel and Bladder- If you have new loss of bowel or bladder control, report straight to the emergency room.  -Mental status- If you or your family notice a change in your mental state from your normal, go straight to the emergency room. A change in mental status includes: increased confusion, trouble waking up, blurry/double vision, severe uncontrolled headaches, vomiting, loss of control of one side of your body, seizures, or other concerning issues.    Important Numbers:  -Neurosurgery resident on-call 24/7: Call [phone] for the [facility] hospital operator. Ask to speak with the ???Neurosurgery resident on-call.???  -  Neurosurgery Clinic: [phone] M-F 8am-5pm  -Spine Center: [phone] M-F 8am-5pm  -Neurosurgery Office: [phone] M-F 8am-5pm    Discharge dressing      Wound care:   Examine the surgical site at least twice daily. Please change the dressing when wet/dirty until the 3rd day after surgery, then keep the wound open to air. Do not use ointments, creams, gels, peroxide, or blow dryers on the wound. You may shower on the 4th day after surgery. Please shower daily starting on day 4 and use baby shampoo for head incisions to clean the wound. Do not scrub too vigorously. Do not submerge the wound under water (swimming or tub soaking) until 4-6 weeks after surgery. After showering, pat the wound dry and leave open to air. You have an appointment in approximately 2 weeks for a wound check. If you did not receive an appointment while in the hospital, the clinic will contact you will an appointment.    Discharge instructions      You have an appointment in 2 weeks to follow up with Derrill Memo. If you do not receive an appointment time within 1 week of discharge, please call the neurosurgery clinic at the number listed above.    Discharge instructions      Pain:   You have been prescribed a narcotic pain medication. Do not drive or make critical/important decisions while taking this medication. Narcotic medications may cause constipation. Ensure you have adequate (>25 grams/day) of fiber in your diet and drink at least 64 oz. of water daily. You may also wish to take an over the counter stool softener once or twice daily as needed for constipation while taking narcotic pain medication.              Appointments which have been scheduled for you    Nov 25, 2020 11:30 AM  (Arrive by 11:15 AM)  NURSE VISIT with ONCINF [facility] LABS  [facility]) [address]  [place] [address]  [phone]      Nov 25, 2020 12:00 PM  (Arrive by 11:45 AM)  RETURN ACTIVE [place] with Clarissa Trixie Dredge, PA  [facility] [place] [facility] REGION) [address]  [place] [address]  [phone]      Nov 25, 2020  1:00 PM  (Arrive by 12:45 PM)  LEVEL 150 with ONCINF HMOB CHAIR 09  [facility] [place] [facility] REG[facility]) [address]  [place] [address]  [phone]      Dec 23, 2020  9:00 AM  (Arrive by 8:45 AM)  LEVEL 150 with ONCINF HMOB CHAIR 08  [facility] REGION) [address]  [place] [address]  [phone]

## 2020-11-14 DIAGNOSIS — Z171 Estrogen receptor negative status [ER-]: Principal | ICD-10-CM

## 2020-11-14 DIAGNOSIS — C50412 Malignant neoplasm of upper-outer quadrant of left female breast: Principal | ICD-10-CM

## 2020-11-14 MED ORDER — DEXAMETHASONE 1 MG TABLET
ORAL_TABLET | ORAL | 0 refills | 12 days | Status: CP
Start: 2020-11-14 — End: 2020-11-26

## 2020-11-14 MED ORDER — LEVETIRACETAM 500 MG TABLET
ORAL_TABLET | Freq: Two times a day (BID) | ORAL | 0 refills | 4 days | Status: CP
Start: 2020-11-14 — End: 2020-11-18

## 2020-11-14 MED ORDER — PROCHLORPERAZINE MALEATE 10 MG TABLET
ORAL_TABLET | Freq: Four times a day (QID) | ORAL | 2 refills | 8 days | Status: CP | PRN
Start: 2020-11-14 — End: 2020-12-14

## 2020-11-14 NOTE — Unmapped (Signed)
Pt sent mychart msg stating medications from discharge needed to be sent to [facility] in [place] as they were not available at [facility].  Rx for compazine, dexamethasone, keppra [facility].    Pt knows to take compazine 10 mg at most q 6 hours.  She is not continuing capecitabine.    Has appt with rad onc on 9/28.  Can discuss palliative radiation to symptomatic rib met at that time as well.    Will tentatively plan to start sacituzumab after completion of radiation.  Orders placed for 10/14 (adjust based on radiation schedule).    Discussed pain control.  Pt reports she is taking 30 mg oxycodone and then 1 hours later 22.5 mg of oxycodone; three hours later taking 30 mg oxycodone.  This is working reasonably well for pain control with some pain peaks.  Will send msg to pall care for confirmation of pain regimen and follow-up.

## 2020-11-14 NOTE — Unmapped (Signed)
Addended by: Donzetta Kohut on: 11/14/2020 02:48 PM     Modules accepted: Orders

## 2020-11-14 NOTE — Unmapped (Signed)
Hi,    Patient Sheri [name] called requesting a medication refill for the following:    ??? Medication: Dexamethasone, lezetiracetan, Prochlorperazine  ??? Dosage:   ??? Days left of medication:   ??? Pharmacy: [facility] [place]    The expected turnaround time is 3-4 business days       Check Indicates criteria has been reviewed and confirmed with the patient:    [x]  Preferred Name   [x]  DOB and/or MR#  [x]  Preferred Contact Method  [x]  Phone Number(s)   [x]  Preferred Pharmacy   []  MyChart     Thank you,  Christell Faith  [facility]  [phone]

## 2020-11-15 ENCOUNTER — Telehealth: Admit: 2020-11-15 | Discharge: 2020-11-16 | Payer: MEDICAID

## 2020-11-15 DIAGNOSIS — Z515 Encounter for palliative care: Principal | ICD-10-CM

## 2020-11-15 DIAGNOSIS — G893 Neoplasm related pain (acute) (chronic): Principal | ICD-10-CM

## 2020-11-15 DIAGNOSIS — F419 Anxiety disorder, unspecified: Principal | ICD-10-CM

## 2020-11-15 MED ORDER — LORAZEPAM 1 MG TABLET
ORAL_TABLET | Freq: Every evening | ORAL | 0 refills | 8 days | Status: CP | PRN
Start: 2020-11-15 — End: 2020-11-23

## 2020-11-15 NOTE — Unmapped (Signed)
Specialty Medication(s): Capecitabine    Sheri [name] has been dis-enrolled from the [facility] specialty pharmacy services due to medication discontinuation resulting from progression of disease.    Additional information provided to the patient: no    Rollen Sox  [facility] Specialty Pharmacist

## 2020-11-15 NOTE — Unmapped (Addendum)
Hi Ms Milroy,    It was so nice to see you on today's video visit, and to meet your dad and speak to your stepmother.    We added Ativan to take nightly for anxiety, and we came up with a medication schedule.    6AM: Sertraline, Tylenol, Naproxen, dexamethasone, Keppra, Compazine, oxycodone  10AM-ish: oxycodone  12-2PM: Tylenol, Compazine, oxycodone  6PM: Naproxen, dexamethasone (when taking twice a day), Keppra, oxycodone  10PM-ish: Tylenol, Compazine, oxycodone, Ativan  2AM-ish: oxycodone    You should take 1 cap of Miralax and/or 2 tabs of Senna if you go 2 days without a bowel movement. And continue to use Tessalon perles as needed for cough.    I'll look forward to seeing you again in 1 week, and please reach out in the meantime if you notice any changes or have new questions or concerns.    All the best,  Dr Merlene Morse

## 2020-11-15 NOTE — Unmapped (Signed)
OUTPATIENT ONCOLOGY PALLIATIVE CARE    Principal Diagnosis: Sheri [name] is a 25 y.o. female with metastatic L breast cancer, diagnosed in October 2019. Disease sites include breast and both lungs, newly metastatic to brain.    Assessment/Plan:   1. 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 Pain control regimen somewhat less effective off steroids. We initiated opioid medications on 09/13/20 and discussed the importance of using only what is prescribed, keeping track of doses used (number per day and specific dose needed), not allowing others access to the medications, not using any other opioid or non-opioid medications prescribed to others, communicating about changes in pain needs other other substances being used outside of what has been prescribed.  -- Continue Tylenol 1,000mg  q8h  -- Continue Naproxen BID  -- Continue oxycodone as solo opioid tx, at a dose of 30mg  q3-5h (scheduled), had been providing 2-week supply; PDMP reviewed and consistent with only this prescriber  -- Continue to keep app of doses/times/pain characteristics  -- No longer taking gabapentin because was not effective  -- Received a steroid dose pack last week, beneficial, also started bisphosphonate tx, will monitor symptoms closely    2. Opioid-induced constipation, improved due to side effects of chemotherapy:  -- Continue Miralax and Senna as needed, discussed their use with parents today and encouraged starting one or both meds if 2 days without a BM  -- Discussed titrating to one soft BM daily while on opioid medications    3. Cough: stable; likely related to lung metastases, responds to Tessalon, also exacerbated by reflux  -- Tessalon 100mg  BID-TID PRN, effective at this dose  -- Famotidine 20mg  BID PRN, using after symptoms arise/with other meds, may improve if remains off NSAIDs  -- May respond to oxycodone as above  -- Continue to monitor for changes    4. Depression and anxiety: exacerbated by recent events including new brain mets and recent brain surgery. Ativan was helpful while hospitalized. Had been holding off on adding benzodiazepine medications but due to worsening situational anxiety the benefits outweigh potential risks. These risks, including potentiation of or interaction with opioid medications and potential for increased confusion, were discussed with Sheri Castronova as well as her father and stepmother, and plan was made to start slow with only a nighttime dose of Ativan and close monitoring by parents. There are young children in the home, and we discussed the importance of keeping medication secure and/or away from anyone to whom it is not prescribed.  -- START lorazepam 1mg  at bedtime as needed for acute anxiety, will consider increasing to BID PRN if no significant side effects in coming week  -- Continue sertraline 200mg  daily  -- Will consider additional adjuncts including olanzapine at future date when she is on less medications (currently on time-limited anticonvulsant and steroid taper)    5. Nausea: occurs with chemotherapy and early days of opioid use, improved with most recent modifications  -- Continue Compazine q8h PRN  -- Avoid Zofran as was contributing to headaches    6. Mobility issues: getting out of bed and getting dressed has been burdensome, was in PT/OT, need to recheck status    7. Coping/GOC: did not discuss in detail  today but known to not desire to be included in discussions about details regarding disease status and prognosis. Father and stepmother have been supportive since surgery and are helping with ADLs and medication management as needed. Today we set a schedule for ease of medication management, and can adjust as needed. She will take all of her meds at 0600. Her BID meds will be taken at 1800. TID meds will be taken together midday and at bedtime along with new Ativan Rx. Continue to use oxycodone on a schedule every 4 or so hours. She will contact me if any symptom changes as she weans her steroid medication.    # Controlled substances risk management.   - Patient has a signed pain medication agreement with Outpatient Palliative Care, completed on 09/13/20, as per standard of care. Pain contract discussed in detail, including her high risk status and our inability to prescribe if any future red flags. In the past she had used a paisley medicine bag for Zoloft, Tylenol, heartburn medication etc. Now the medications are laid out on a small table beside her bed, and the family is careful that no one else at home accesses them.   - [place] database was reviewed today and it was appropriate. No benzodiazapine prescriptions since 2020   - Urine drug screen was not performed at this visit. Findings: not applicable.   - Patient has received information about safe storage and administration of medications.   - Patient has received a prescription for narcan; has been educated on its use. Patient's caregiver/friend Sam was not present, but she was encouraged to discuss with her.      F/u: 1 week    ----------------------------------------  Referring Provider: Dr Avis Epley  Oncology Team:   Medical Oncologist: Dr. Marc Morgans / Louann Liv  Surgical Oncologist: Dr. [name]   Radiation Oncologist: Dr. Zacarias Pontes   PCP: No PCP Per Patient      HPI: Sheri [name] is a 25 y.o. woman with left breast cancer metastatic to lungs. She was initially diagnosed with local disease in October 2019 and completed chemotherapy in March 2020. Was found to have residual disease at time of mastectomy in April 2020 and again at time of reconstruction, and additional surgical resection was required. Restaging scans in August 2020 did not reveal metastatic disease, and she completed adjuvant radiation in December 2020. She was scheduled to continue capecitabine after radiation but due to a complicated psychosocial situation this did not occur. She was found to have metastatic disease in August 2021 when she presented with hemoptysis and cough and was found to have bilateral lung involvement. Developed worsening headaches in early September and was found to have diffuse brain metastases now s/p L craniotomy with resection on 11/10/20.    Current cancer-directed therapy: awaiting radiation and then currently planned for sacituzumab govetican mid-October     Today's update (Chanz Cahall virtual visit, 11/15/20, dad Virl Diamond and stepmom who is an L&D RN both present):  -- Headaches improved, now more like an itch  -- Pain: since coming home has been taking 30mg  q4h, not really needing breakthrough doses of 15-22.5mg  (once used 15mg  as breakthrough, otherwise hasn't needed, so hasn't cut any pills in half). Also taking Naproxen BID (10a/10p), Tylenol q8h (6a,330p,1130p). Stepmom said she tends to moan in her sleep when her pain is poorly controlled and there was very little moaning last night; the one time she heard it she realized it had been 4 hours since last dose of oxycodone  so she woke her up and encouraged her to take a dose.  -- Taking dexamethasone on a taper, currently three times a day, will soon reduce to twice a day, is following guidance on bottle which is also set up in a calendar on her phone  -- Compazine every 6-8 hours (2p/8p/6a)  -- Keppra BID x 4 days (2a/2p)  -- Anxiety: Sertraline (6am), was not prescribed Ativan for home but was very helpful in hospital both for anxiety related to new diagnosis and racing thoughts at night impairing ability to fall asleep  -- Other symptoms currently include some blurred vision on and off, which is improved since surgery. She is not wobbly on her feet and has no additional episodes of urine incontinence.  -- Last BM yesterday, soft/normal in caliber but needed an enema while hospitalized so will be monitoring closely  -- Nausea under control with TID Compazine      Prior history (Maher Shon video visit) 10/25/20:  -- Had a good time at the beach with dad/stepmom and sibs  -- Got a lot of my strength back and pain was well controlled on steroid pack last week; says she's not usually able to open bottles easily but that hasn't been a problem anymore  -- Pain well controlled on current oxycodone regimen, continues to use 1-2 tabs every 4-6 hours, usually 2 tabs at a time unless she plans to be driving  -- Bisphosphonate infusion yesterday which she hopes will kick in and replace need for steroids?  -- Has been using the OT putty since seeing them last fall  -- Medicaid application is on hold because she can't find titles to the 3 vehicles that were bought in her name    Interval events (Oliviah Agostini video visit) 10/17/20:  -- Conducted this visit one day early due to her plans to take a trip to the beach with her dad, stepmom, and their kids including two teenage half-brothers and one 57-year-old half brother  -- Had been in touch mid-week about Sheri-contin not lasting very long, and this MD suggested she increase frequency to TID  -- Even on a TID dose she was still using oxycodone somewhat regularly  -- Pain relatively well controlled on both medications, but Sheri-contin was not lasting more than 4-6 hours, not much difference from the oxycodone  -- Some nausea related to chemotherapy, has been using Compazine which is helpful, requests a refill today  -- No issues with constipation, mood has been good, urinary symptoms remain resolved    Interval events (Chett Taniguchi video visit) 10/11/20:  -- Pain still ranges from 7-7.5 and down to 2-3 after oxycodone kicks in  -- Continues to take only 1 tab if planning to do any driving  -- BMs every 2-3 days, Miralax is at her mom's house where she plans to be later today, will resume use  -- No more urinary symptoms  -- Saw Rheum, received a diagnosis of hypertrophic osteoarthropathy, unsure when she will receive treatment, glad she has a diagnosis    Interval events (Ayanni Tun, video visit), 10/04/20:  -- Pain remains well controlled on oxycodone 30mg  q4-6h when used consistently  -- Slightly more pain off steroids  -- Will wake up stiff and in pain when no recent overnight dose of oxycodone  -- Has been sleeping more regular hours past few nights, feels good  -- No constipation as diarrhea is a side effect of current chemotherapy regimen  -- Has been having a few days of urinary urgency  and incontinence, wondering if related to opioids or something else; says she will wake up stiff and achy and unable to make it to bathroom before urinating spontaneously.    Interval history (Tarrah Furuta, video visit), 09/20/20:  -- Pain very well controlled with oxycodone, has been taking notes because couldn't find an app; has used it less than prescribed  -- Will sometimes take 1 tab to avoid being too sleepy (if she was going to be driving our out and about)  -- Slept a lot after the first few days, now much more manageable  -- Pain described as pulsating and a 6 or 7/10 when she decides to take medication, will notice it first in ankles and wrists  -- Rates her pain as 0-2/10 about an hour after taking 2 tabs of oxycodone, will go down to 3.5 or 4/10 with one tablet  Per pain journal:       -- Tuesday 7/19: 2 tabs at 9pm       -- Wednesday 7/20: 1 tab at 1am, 1 tab at 11am, 1 tab at 2pm, 2 tabs at 6pm       -- Thursday 7/21:  2 tabs at MN, 2 tabs at 6am, 2 tabs at 2pm       -- Friday 7/22: 1 tab at 1am, 1 tab at 2am, 1 tab at 5am, 2 tabs at 11am, 2 tabs at 4pm, 2 tabs at 9pm       -- Saturday 7/23: 2 tabs at 3am, 2 tabs at 11am, 2 tabs at 6pm       -- Sunday 7/24: 2 tabs at 2am, 2 tabs at 6am, 2 tabs at 12pm, 2 tabs at 4pm, 1 tab at 9pm       -- Monday 7/25: 2 tabs at 3am, 2 tabs at 9am, 2 tabs at 1pm, 2 tabs at 5pm       -- Tuesday 7/26: 2 tabs at 1am, 2 tabs at 7am, 2 at 2pm  -- She has 8 tablets remaining, thinks she may have missed writing down one dose of one pill at some point  -- Some eyelid swelling for past 4-5 days, had started chemotherapy a few days prior to that  -- Has also been somewhat nauseated, wasn't sure if it was from chemo or oxycodone, compazine has been helpful  -- Keeps a funny sleep schedule (goes to bed around 0500 and wakes up early afternoon, will get up earlier for meds and then go back to sleep) and sleeps ok, falls asleep easily  -- Notes slightly more swelling but believes it to be a side effect of chemotherapy  -- Mobility much better and not waking up with as much stiffness/pain in her joints  -- Anxiety had been heightened in the first few days after starting oxycodone, used Ativan because worried about having a panic attack, has been better since then.    Interval events (Jesly Hartmann/Swift), 09/13/20:  -- Pain described as all over my body and dull/throbbing, 8+/10 at its worst, will lie in bed crying when unable to capture it with current regimen  -- Will schedule meals around med schedule, because her medications are more effective on an empty stomach  -- Has tried percocet and morphine (most recently on Saturday), will take the pain down to 2/10 but only when taking 30mg  oxycodone dose  -- No more headaches, possibly from Zofran and/or prior chemo  -- Anxiety has been fluctuating, worse last week after scans/results, taking Ativan from prior prescription,  has approximately 6 tablets left, thinks they are 1mg  dose  -- Still on a stable dose of Zoloft 200mg  which helps with baseline depression/anxiety, but her pain has been exacerbating her depression  -- Using a sleep aid (diphenhydramine), did not know it was a Benadryl equivalent  -- Been living with her mom for past several weeks brother there too but not much interaction, will stay with friend Sam for 1-2 weeks at a time and remain on good terms  -- Mom has alcoholism and paranoia, tends to fixate on certain topics, worse when it's focused on Sheri Burgin herself, currently not an active issue for past few weeks  -- Talked about therapy as not having a lasting benefit but open to setting up a more stable counseling plan with Santina Evans if it can build in some flexibility based on her pain and mobility issues       Symptom Review (January 2022):  General: doing poorly overall, has had some debilitating pain in preceding weeks, unclear etiology  Pain: full-body pain has been worse in last few weeks, will improve in week or so after chemo but then become worse, described as pain in wrists and ankles, also mid/upper back  Headaches: resolved, were present with last 3 cycles, frontal in nature and very intense, would occur 48-72 hours after tx but most recently occurred immediately following treatment. No response to Naproxen/Tylenol, did seem to respond to acetaminophen/oxycodone.  Fatigue: worse  Mobility: worse, has had discomfort with movement  Sleep: improved quality, will sleep all day after chemo  Appetite: fluctuates with pain  Nausea: present with and after, will take antiemetic meds for 1-2 days post chemo  Bowel function: regular, no current issues with constipation/hemorrhoids/bleeding  Dyspnea: not an issue, does have a fluctuating cough  Secretions: not an issue  Mood: somewhat stable on sertraline, has been depressed due to pain  Edema: fluctuates but especially bad at this time, off steroids    Interval history 05/03/20 (Rondale Nies/Swift joint visit with Beckie Busing): Had seen occupational therapy for consultative visit, plan to attend first official appointment tomorrow, will have 1x/week for 7 weeks. Will also have physical therapy and lymphedema therapy as well. She and her friend Sam describe a lack of motivation for anything, including coming to the hospital for chemotherapy despite knowing it will be helpful. Pain was debilitating for some time but was able to take their scheduled trip, now doing somewhat better and looking forward to chemotherapy.    Interval history 04/12/2020 (Laramie Gelles/Swift joint visit with Urban): Had and recovered from COVID but has a persistent cough (tends to happen with all colds since childhood, mother has always been a heavy smoker which she believes contributed); was under the impression that she was not allowed back in cancer hospital for 21 days after infection, which has led to a delay in chemo admin and worsening symptoms. She says that her pain and swelling improve with chemo and is looking forward to today's treatment for those reasons. Nausea has been under better control with tweaks to the infusion regimen and PRN Compazine. Pain meds (Tylenol and Naproxen) have been only somewhat helpful for her joint pain and swollen extremities, and she is worried about an upcoming trip and whether the pain will interfere with her ability to enjoy her travels. She has also been having mobility challenges related to swelling and joint pain. Her friend Sam wonders whether physical therapy might be helpful for providing ideas for stretches/exercises they can do together at home. They  are also working on setting up a regular routine to improve daytime energy.    Interval history 03/08/20 (Shirleyann Montero/Swift):   -- Feeling much better overall, with significant improvement in full-body pain now that she's been on a stable dose of Naproxen and Tylenol twice a day.   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   -- Began having headaches after chemotherapy, initially delayed and most recently when a nausea medication change was made, she got a headache immediately after treatment. It improved with 1/2 Percocet though she expressed a desire to avoid opioids. Now that it's been 3 days the headache has resolved, but she would love to have a way to prevent it with future chemo sessions. Says she tries to drink extra water around chemo sessions.  -- Nausea is somewhat stable and occurs with chemo and for 2-3 days afterward. Zofran has not been very helpful so she is mostly using Compazine.   -- Mental health much improved at this time; she's staying with her mother because relationship with dad has been strained since the holidays. Friend Sam also staying with her, and they plan to stay with Sam's parents if/when they need to vacate her mom's house    Interval history 01/12/20 (Laiken Nohr/Steele/Swift): Doing somewhat better overall, gabapentin trial was slightly (8-10%) helpful with arm/leg pain and possibly with sleep as well. Encouraging herself to get up and busy during the day, says she's glad she's been pushing herself. Went bowling with friends one night and had a good time. Says she won't overdo it when she knows she can't handle it. Still using non-prescription Xanax approximately once a day, believes it's 0.5mg  per dose. Hoping to come off it at some point and open to making small incremental changes and working on that together with her medical team. Says she saw someone in [place] about her joints and was told that there is no cartilage, that it's a result of the chemotherapy she was on in the past.    Today's history 12/08/19 (Sashay Felling/Swift): Discussed her cancer history, a bad relationship that kept her from being able to participate in clinic visits and attend to her scheduled cancer care, and the fact that she is currently splitting time between both parents' homes and independently managing her medications and cancer care coordination. She shared that she had been self medicating with Xanax bars that she bought off the street and used in squares that she thinks are equivalent to 0.5mg  doses; that at times she would take more than a single dose to numb the pain even though she knew it wasn't actually affecting her pain. Symptoms as above.    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; had moved to night shifts due to issues with pain control limiting activity, picking up shifts again  Hobbies: detailing cars at the family shop  Current residence / distance from [place]: [place], [place]    Advance Care Planning:  HCPOA: not established  Natural surrogate decision maker: parents  Living Will: N/A  ACP note: 11/09/20    Objective     Opioid Risk Tool:    Female  Female    Family history of substance abuse      Alcohol  1  3    Illegal drugs  2  3    Rx drugs  4  4    Personal history of substance abuse      Alcohol  3  3    Illegal drugs  4  4    Rx drugs  5  5    Age between 16--45 years  1  1    History of preadolescent sexual abuse  3  0    Psychological disease      ADD, OCD, bipolar, schizophrenia  2  2    Depression  1  1       Total: 8+ high risk  (<3 low risk, 4-7 moderate risk, >8 high risk)    Oncology History Overview Note   Left breast cT2 N0 IDC, G3, weakly ER+, PR-, HER-     Malignant neoplasm of upper-outer quadrant of left breast in female, estrogen receptor negative (CMS-HCC)   12/03/2017 -  Presenting Symptoms    Presented with self detected left breast mass x 3 weeks.  MMG/US: Left breast irregular 2.7 x 3.2 cm mass. There are faint microcalcifications associated with the mass and mild surrounding trabecular thickening is suggestive of edema.  There are 2 hyperdense and enlarged left axillary lymph nodes. No other suspicious findings are seen in the left breast.     12/03/2017 -  Other    Clinic exam: Left breast in the 1 to 2 o'clock position there is a 4 cm irregular mass. Subtle pink erythema without pitting. Left axilla with a palpable 2 cm lymph node.     12/03/2017 Biopsy    Left breast USG core biopsy: IDC, G3, ER+(5%), PR-(0), HER2-(0)  Left axillary USG core biopsy: fragments of LN negative for carcinoma     12/10/2017 Initial Diagnosis    Malignant neoplasm of upper-outer quadrant of left breast in female, estrogen receptor negative (CMS-HCC)     12/10/2017 Tumor Board    MDC recs: Left breast cT2 N0 IDC, G3, +/-/-. Very suspicious LN on imaging and exam. LN core biopsy negative. Discussed considering repeat core (although needle is definitively in the node) vs outback SLN. Will receive NACT. If node neg, plan for outback SLN. Node positivity would affect radiation recs if patient has mastectomy. Meeting genetics today. Will discuss fertility. Also discussed concerns for XRT in young patient in 20's - higher risk of complications from XRT long-term. Consider staging studies.     12/10/2017 -  Cancer Staged    Staging form: Breast, AJCC 8th Edition  - Clinical stage from 12/10/2017: Stage IIB (cT2, cN0(f), cM0, G3, ER+, PR-, HER2-) - Signed by Talbert Cage, DO on 12/10/2017       12/11/2017 Biopsy    Repeat left axillary core biopsy given clinical suspicion: LN negative for carcinoma     12/24/2017 - 02/17/2018 Chemotherapy    OP BREAST AC (DOSE DENSE)  DOXOrubicin 60 mg/m2, Cyclophosphamide 600 mg/m2 every 14 days     01/08/2018 Genetics    Patient has genetic testing done for breast cancer.  Negative [facility] Breast Cancer Guidelines-Based Panel.     02/25/2018 - 05/19/2018 Chemotherapy    OP BREAST PACLItaxel WEEKLY /  CARBOplatin EVERY 3 WEEKS  PACLItaxel 80 mg/m2 IV weekly   CARBOplatin AUC 6 IV every 3 weeks   21-day cycle x 4 cycles      05/30/2018 Surgery    Left SSM: Residual IDC, Gr 2 +/-/- measuring 8mm.  Final margins are clear.  0/4 SLN with metastatic disease  Residual Cancer Burden   Tumor bed size: 8 mm x 7 mm  Overall tumor cellularity: 40%  Percentage in situ: 0%  Number of involved lymph nodes: 0  Diameter of largest metastasis: 0 mm  Residual Cancer Burden Score: 1.687  Residual Cancer Burden Class: RCB-II     06/09/2018 -  Cancer Staged    Staging form: Breast, AJCC 8th Edition  - Pathologic stage from 06/09/2018: No Stage Recommended (ypT1b, pN0(sn), cM0, G2, ER+, PR-, HER2-) - Signed by Rudie Meyer, ANP on 06/09/2018       10/23/2018 Surgery    Left chest wall excision by Dr. Dellis Anes. Final Path: IDC Grade 3 ER-PR- HER2- measuring 1 cm in greatest dimension. Final Margins clear.      11/12/2018 -  Cancer Staged    Staging form: Breast, AJCC 8th Edition  - Pathologic stage from 11/12/2018: Stage IB (rpT1b, pN0, cM0, G3, ER-, PR-, HER2-) - Signed by Talbert Cage, DO on 11/13/2018       11/10/2019 - 05/03/2020 Chemotherapy    OP BREAST GEMCITABINE (2 OF 3 WEEKS)  gemcitabine 1,000 mg/m2 IV on days 1, 8, every 21 days     08/10/2020 -  Cancer Staged    Staging form: Breast, AJCC 8th Edition  - Pathologic: Stage IV (pM1) - Signed by Donzetta Kohut, PA on 08/10/2020       09/15/2020 - 09/15/2020 Chemotherapy    OP BREAST SACITUZUMAB GOVITECAN-HZIY  sacituzumab govitecan-hziy 10 mg/kg IV on days 1, 8, every 21 days     12/09/2020 -  Chemotherapy    OP BREAST SACITUZUMAB GOVITECAN-HZIY  sacituzumab govitecan-hziy 10 mg/kg IV on days 1, 8, every 21 days         Patient Active Problem List   Diagnosis   ??? Malignant neoplasm of upper-outer quadrant of left breast in female, estrogen receptor negative (CMS-HCC)   ??? Microcytic anemia   ??? Symptomatic anemia   ??? PTSD (post-traumatic stress disorder)   ??? 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 Anxiety disorder   ??? S/P breast reconstruction   ??? Arthralgia   ??? Nausea   ??? Hypertrophic osteoarthropathy   ??? Brain metastasis (CMS-HCC)       Past Medical History:   Diagnosis Date   ??? Anxiety    ??? Depression    ??? Malignant neoplasm of upper-outer quadrant of left breast in female, estrogen receptor negative (CMS-HCC) 12/10/2017   ??? Panic disorder    ??? PTSD (post-traumatic stress disorder)        Past Surgical History:   Procedure Laterality Date   ??? AUGMENTATION MAMMAPLASTY Left 08/2018    left only    ??? BREAST BIOPSY Left 2019    malignant   ??? CHEMOTHERAPY Left 11/2017    May 13 2018   ??? IR INSERT PORT AGE GREATER THAN 5 YRS  12/18/2017    IR INSERT PORT AGE GREATER THAN 5 YRS 12/18/2017 [name], MD IMG VIR [facility]   ??? MASTECTOMY Left 05/2018   ??? PR BRONCHOSCOPY,BIOPSY Right 10/23/2019    Procedure: Bronchoscopy, Rigid/Flexible, Include Fluoro Guide When Performed;  W/Bronch/Endobronch Bx, Single/Mult Site;  Surgeon: Mercy Moore, MD;  Location: MAIN OR [facility];  Service: Pulmonary   ??? PR BRONCHOSCOPY,TRANSBRON ASPIR BX Right 10/23/2019    Procedure: Bronchoscopy, Rigid/Flex, Incl Fluoro; W/Transbronch Ndl Aspirat Bx, Trachea, Main Stem &/Or Lobar Bronchus;  Surgeon: Mercy Moore, MD;  Location: MAIN OR [facility];  Service: Pulmonary   ??? PR BX/REMV,LYMPH NODE,DEEP AXILL Left 05/30/2018    Procedure: BX/EXC LYMPH NODE; OPEN, DEEP AXILRY NODE;  Surgeon: Talbert Cage, DO;  Location: ASC OR [facility];  Service: Surgical Oncology Breast   ??? PR EXC TUMOR SOFT TISSUE NECK/ANT THORAX SUBQ 3+CM Right 10/23/2018    Procedure: Priority EXCISION, TUMOR, SOFT TISSUE OF NECK OR ANTERIOR THORAX, SUBCUTANEOUS; 3 CM OR GREATER;  Surgeon: Talbert Cage, DO;  Location: ASC OR [facility];  Service: Surgical Oncology Breast   ??? PR EXCIS SUPRATENT BRAIN TUMOR Left 11/10/2020    Procedure: CRANIECTOMY; EXC BRAIN TUMOR-SUPRATENTORIAL;  Surgeon: Odie Sera, MD;  Location: MAIN OR [facility];  Service: Neurosurgery   ??? PR IMPLNT BIO IMPLNT FOR SOFT TISSUE REINFORCEMENT Left 05/30/2018    Procedure: IMPLANTATION BIOLOGIC IMPLANT(EG, ACELLULAR DERMAL MATRIX) FOR SOFT TISSUE REINFORCEMENT(EG, BREAST, TRUNK);  Surgeon: Arsenio Katz, MD;  Location: ASC OR [facility];  Service: Plastics   ??? PR INSERTION BREAST IMPLANT SAME DAY OF MASTECTOMY Left 05/30/2018    Procedure: IMMED INSRT BREAST PROSTH AFTER MASTOPEX/MASTECT;  Surgeon: Arsenio Katz, MD;  Location: ASC OR [facility];  Service: Plastics   ??? PR INTRAOPERATIVE SENTINEL LYMPH NODE ID W DYE INJECTION Left 05/30/2018    Procedure: INTRAOPERATIVE IDENTIFICATION SENTINEL LYMPH NODE(S) INCLUDE INJECTION NON-RADIOACTIVE DYE, WHEN PERFORMED;  Surgeon: Talbert Cage, DO;  Location: ASC OR [facility];  Service: Surgical Oncology Breast   ??? PR MASTECTOMY, SIMPLE, COMPLETE Left 05/30/2018    Procedure: Urgent MASTECTOMY, SIMPLE, COMPLETE;  Surgeon: Talbert Cage, DO;  Location: ASC OR [facility];  Service: Surgical Oncology Breast   ??? PR RECMPL WND TRUNK 2.6-7.5 CM Left 10/23/2018    Procedure: REPR COMPLX TRUNK; 2.6 CM TO 7.5 CM;  Surgeon: Arsenio Katz, MD;  Location: ASC OR [facility];  Service: Plastics   ??? PR REPLACEMENT TISSUE EXPANDER W/PERMANENT IMPLANT Left 09/24/2018    Procedure: REPLACEMENT OF TISSUE EXPANDER WITH PERMANENT PROSTHESIS;  Surgeon: Arsenio Katz, MD;  Location: MAIN OR [facility];  Service: Plastics   ??? PR REVISION OF RECONSTRUCTED BREAST Left 09/24/2018    Procedure: R21 - REVISION OF RECONSTRUCTED  BREAST;  Surgeon: Arsenio Katz, MD;  Location: MAIN OR [facility];  Service: Plastics   ??? PR STEREOTACTIC COMP ASSIST PROC,CRANIAL,INTRADURAL N/A 11/10/2020    Procedure: STEREOTACTIC COMPUTER-ASSISTED (NAVIGATIONAL) PROCEDURE; CRANIAL, INTRADURAL;  Surgeon: Odie Sera, MD;  Location: MAIN OR [facility];  Service: Neurosurgery   ??? PR SUSPENSION OF BREAST Right 09/24/2018    Procedure: MASTOPEXY FOR SYMMETRY PURPOSES;  Surgeon: Arsenio Katz, MD;  Location: MAIN OR [facility];  Service: Plastics   ??? PR TISSUE EXPANDER PLACEMENT BREAST RECONSTRUCTION Left 05/30/2018    Procedure: BREAST RECON IMMED/DELAY W/EXPANDR W/SUBSQT EXPA;  Surgeon: Arsenio Katz, MD;  Location: ASC OR [facility];  Service: Plastics       Current Outpatient Medications   Medication Sig Dispense Refill   ??? acetaminophen (TYLENOL) 500 MG tablet Take 1,000 mg by mouth every eight (8) hours.     ??? benzonatate (TESSALON PERLES) 100 MG capsule Take 1 capsule (100 mg total) by mouth Three (3) times a day as needed  for cough. 60 capsule 1   ??? dexAMETHasone (DECADRON) 1 MG tablet Take 4 tablets (4 mg total) by mouth every eight (8) hours for 2 days, THEN 4 tablets (4 mg total) two (2) times a day for 2 days, THEN 4 tablets (4 mg total) daily for 2 days, THEN 2 tablets (2 mg total) daily for 2 days, THEN 1 tablet (1 mg total) daily for 2 days, THEN 0.5 tablets (0.5 mg total) daily for 2 days. 55 tablet 0   ??? ergocalciferol-1,250 mcg, 50,000 unit, (VITAMIN D2-1,250 MCG, 50,000 UNIT,) 1,250 mcg (50,000 unit) capsule Take 1 capsule (1,250 mcg total) by mouth once a week. 4 capsule 2   ??? famotidine (PEPCID) 20 MG tablet Take 1 tablet (20 mg total) by mouth Two (2) times a day. 60 tablet 11   ??? ferrous sulfate 325 (65 FE) MG tablet Take 1 tablet (325 mg total) by mouth daily. Take with orange juice. 30 tablet 3   ??? levETIRAcetam (KEPPRA) 500 MG tablet Take 1 tablet (500 mg total) by mouth Two (2) times a day for 4 days. 8 tablet 0   ??? lidocaine-prilocaine (EMLA) 2.5-2.5 % cream Apply topically once as needed for up to 1 dose. Apply small amount to port site 30 minutes - 1 hour prior to access. 30 g 0   ??? loperamide (IMODIUM) 2 mg capsule Take 2 mg by mouth 4 (four) times a day as needed.      ??? naloxone (NARCAN) 4 mg nasal spray One spray in either nostril once for known/suspected opioid overdose. May repeat every 2-3 minutes in alternating nostril til EMS arrives 2 each 1   ??? naproxen (NAPROSYN) 500 MG tablet Take 1 tablet (500 mg total) by mouth in the morning and 1 tablet (500 mg total) in the evening. Take with meals. 60 tablet 2   ??? ondansetron (ZOFRAN-ODT) 4 MG disintegrating tablet Dissolve 1 tablet (4 mg total) on the tongue every eight (8) hours as needed for nausea for up to 30 doses. 30 tablet 1   ??? oxyCODONE (ROXICODONE) 15 MG immediate release tablet Take 1-2 tablets (15-30mg ) every 4-6 hours as needed for cancer related pain. 140 tablet 0   ??? pantoprazole (PROTONIX) 40 MG tablet Take 1 tablet (40 mg total) by mouth daily. 90 tablet 3   ??? prochlorperazine (COMPAZINE) 10 MG tablet Take 1 tablet (10 mg total) by mouth every six (6) hours as needed (nausea). 30 tablet 2   ??? senna (SENNA) 8.6 mg tablet Take 1-2 tablets by mouth two (2) times a day as needed for constipation. 120 tablet 11   ??? sertraline (ZOLOFT) 100 MG tablet Take 2 tablets (200 mg total) by mouth daily. 60 tablet 2     No current facility-administered medications for this visit.       Allergies:   Allergies   Allergen Reactions   ??? Amoxicillin Other (See Comments)     EDEMA OF FACE   ??? Meloxicam Swelling and Rash     Swelling, itching, rash    ??? Shellfish Containing Products Swelling     Facial and throat swelling       Family History:  Cancer-related family history includes Skin cancer in her mother. There is no history of Breast cancer, Colon cancer, or Ovarian cancer.  She indicated that the status of her mother is unknown. She indicated that the status of her neg hx is unknown.      REVIEW OF SYSTEMS:  A  comprehensive review of 10 systems was negative except for pertinent positives noted in HPI.      PHYSICAL EXAM:   VS not reviewed in Epic. Video visit.  GEN: Awake and alert, pleasant appearing female in no acute distress  PSYCH: Euthymic. Amiable.  HEENT: Mild b/l eyelid and facial edema.  CV: No heave.  LUNGS: No increased work of breathing, solitary cough during today's long visit.  SKIN: No rashes, petechiae or jaundice noted.    Lab Results   Component Value Date    CREATININE 0.33 (L) 11/13/2020     Lab Results   Component Value Date    ALKPHOS 164 (H) 11/09/2020    BILITOT 0.3 11/09/2020    BILIDIR <0.10 10/21/2018    PROT 7.4 11/09/2020    ALBUMIN 3.1 (L) 11/09/2020    ALT <7 (L) 11/09/2020    AST 8 11/09/2020              The patient reports they are currently: at home. I spent 45 minutes on the real-time audio and video with the patient on the date of service. I spent an additional 45 minutes on pre- and post-visit activities on the date of service, including chart check for updates since/during hospitalization, med prescribing, documentation, writing out the med plan, and communicating with her clinical team via inbasket message.     The patient was physically located in [place] or a state in which I am permitted to provide care. The patient and/or parent/guardian understood that s/he may incur co-pays and cost sharing, and agreed to the telemedicine visit. The visit was reasonable and appropriate under the circumstances given the patient's presentation at the time.    The patient and/or parent/guardian has been advised of the potential risks and limitations of this mode of treatment (including, but not limited to, the absence of in-person examination) and has agreed to be treated using telemedicine. The patient's/patient's family's questions regarding telemedicine have been answered.     If the visit was completed in an ambulatory setting, the patient and/or parent/guardian has also been advised to contact their provider???s office for worsening conditions, and seek emergency medical treatment and/or call 911 if the patient deems either necessary.        Mickie Hillier, MD PhD  [facility]

## 2020-11-19 DIAGNOSIS — G893 Neoplasm related pain (acute) (chronic): Principal | ICD-10-CM

## 2020-11-19 DIAGNOSIS — C50412 Malignant neoplasm of upper-outer quadrant of left female breast: Principal | ICD-10-CM

## 2020-11-19 DIAGNOSIS — Z171 Estrogen receptor negative status [ER-]: Principal | ICD-10-CM

## 2020-11-19 MED ORDER — OXYCODONE 15 MG TABLET
ORAL_TABLET | 0 refills | 0 days | Status: CP
Start: 2020-11-19 — End: ?

## 2020-11-20 DIAGNOSIS — F419 Anxiety disorder, unspecified: Principal | ICD-10-CM

## 2020-11-20 MED ORDER — LORAZEPAM 1 MG TABLET
ORAL_TABLET | Freq: Two times a day (BID) | ORAL | 0 refills | 8 days | Status: CP | PRN
Start: 2020-11-20 — End: 2020-11-28

## 2020-11-21 DIAGNOSIS — Z171 Estrogen receptor negative status [ER-]: Principal | ICD-10-CM

## 2020-11-21 DIAGNOSIS — C50412 Malignant neoplasm of upper-outer quadrant of left female breast: Principal | ICD-10-CM

## 2020-11-22 ENCOUNTER — Ambulatory Visit: Admit: 2020-11-22 | Discharge: 2020-11-23 | Payer: MEDICAID

## 2020-11-23 DIAGNOSIS — C50412 Malignant neoplasm of upper-outer quadrant of left female breast: Principal | ICD-10-CM

## 2020-11-23 DIAGNOSIS — Z171 Estrogen receptor negative status [ER-]: Principal | ICD-10-CM

## 2020-11-24 ENCOUNTER — Telehealth: Admit: 2020-11-24 | Discharge: 2020-11-25 | Attending: Critical Care Medicine | Primary: Critical Care Medicine

## 2020-11-24 MED ORDER — DEXAMETHASONE 2 MG TABLET
ORAL_TABLET | Freq: Two times a day (BID) | ORAL | 0 refills | 15 days | Status: CP
Start: 2020-11-24 — End: ?

## 2020-11-25 DIAGNOSIS — C50412 Malignant neoplasm of upper-outer quadrant of left female breast: Principal | ICD-10-CM

## 2020-11-25 DIAGNOSIS — Z171 Estrogen receptor negative status [ER-]: Principal | ICD-10-CM

## 2020-11-28 ENCOUNTER — Ambulatory Visit: Admit: 2020-11-28 | Discharge: 2020-12-26 | Attending: Radiation Oncology | Primary: Radiation Oncology

## 2020-11-28 ENCOUNTER — Telehealth: Admit: 2020-11-28 | Discharge: 2020-11-29 | Payer: MEDICAID

## 2020-11-28 ENCOUNTER — Ambulatory Visit: Admit: 2020-11-28 | Discharge: 2020-11-30 | Attending: Radiation Oncology | Primary: Radiation Oncology

## 2020-11-28 ENCOUNTER — Ambulatory Visit: Admit: 2020-11-28 | Discharge: 2020-11-29

## 2020-11-28 ENCOUNTER — Ambulatory Visit: Admit: 2020-11-28 | Discharge: 2020-12-17 | Attending: Radiation Oncology | Primary: Radiation Oncology

## 2020-11-28 ENCOUNTER — Ambulatory Visit: Admit: 2020-11-28 | Attending: Radiation Oncology | Primary: Radiation Oncology

## 2020-11-28 ENCOUNTER — Ambulatory Visit: Admit: 2020-11-28 | Discharge: 2020-12-26

## 2020-11-28 DIAGNOSIS — Z171 Estrogen receptor negative status [ER-]: Principal | ICD-10-CM

## 2020-11-28 DIAGNOSIS — G893 Neoplasm related pain (acute) (chronic): Principal | ICD-10-CM

## 2020-11-28 DIAGNOSIS — F419 Anxiety disorder, unspecified: Principal | ICD-10-CM

## 2020-11-28 DIAGNOSIS — Z515 Encounter for palliative care: Principal | ICD-10-CM

## 2020-11-28 DIAGNOSIS — C50412 Malignant neoplasm of upper-outer quadrant of left female breast: Principal | ICD-10-CM

## 2020-11-28 MED ORDER — LORAZEPAM 1 MG TABLET
ORAL_TABLET | Freq: Two times a day (BID) | ORAL | 0 refills | 3 days | Status: CP | PRN
Start: 2020-11-28 — End: ?

## 2020-11-28 MED ORDER — OXYCODONE 15 MG TABLET
ORAL_TABLET | 0 refills | 0 days | Status: CP
Start: 2020-11-28 — End: ?

## 2020-11-30 DIAGNOSIS — Z171 Estrogen receptor negative status [ER-]: Principal | ICD-10-CM

## 2020-11-30 DIAGNOSIS — C50412 Malignant neoplasm of upper-outer quadrant of left female breast: Principal | ICD-10-CM

## 2020-11-30 MED FILL — SERTRALINE 100 MG TABLET: ORAL | 30 days supply | Qty: 60 | Fill #1

## 2020-12-01 MED FILL — NAPROXEN 500 MG TABLET: ORAL | 30 days supply | Qty: 60 | Fill #2

## 2020-12-02 MED ORDER — DEXAMETHASONE 4 MG TABLET
ORAL_TABLET | Freq: Two times a day (BID) | ORAL | 0 refills | 30.00000 days | Status: CP
Start: 2020-12-02 — End: 2020-12-02

## 2020-12-06 ENCOUNTER — Ambulatory Visit: Admit: 2020-12-06 | Payer: MEDICAID

## 2020-12-06 MED ORDER — OXYCODONE 15 MG TABLET
ORAL_TABLET | 0 refills | 0 days | Status: CP
Start: 2020-12-06 — End: ?

## 2020-12-08 MED ORDER — LOPERAMIDE 2 MG CAPSULE
ORAL_CAPSULE | prn refills | 0 days | Status: CP
Start: 2020-12-08 — End: ?

## 2020-12-08 MED ORDER — OLANZAPINE 5 MG TABLET
ORAL_TABLET | ORAL | 2 refills | 0.00000 days | Status: CP
Start: 2020-12-08 — End: ?

## 2020-12-08 MED ORDER — PROCHLORPERAZINE MALEATE 10 MG TABLET
ORAL_TABLET | Freq: Four times a day (QID) | ORAL | 5 refills | 8.00000 days | Status: CP | PRN
Start: 2020-12-08 — End: ?

## 2020-12-08 MED ORDER — DEXAMETHASONE 4 MG TABLET
ORAL_TABLET | Freq: Every day | ORAL | 1 refills | 30 days | Status: CP
Start: 2020-12-08 — End: ?

## 2020-12-13 ENCOUNTER — Ambulatory Visit: Admit: 2020-12-13 | Payer: MEDICAID

## 2020-12-13 ENCOUNTER — Encounter: Admit: 2020-12-13 | Payer: MEDICAID

## 2020-12-13 ENCOUNTER — Ambulatory Visit
Admit: 2020-12-13 | Discharge: 2020-12-29 | Disposition: A | Discharge status: Hospice | Payer: MEDICAID | Admitting: Geriatric Medicine

## 2020-12-13 ENCOUNTER — Encounter: Admit: 2020-12-13 | Discharge: 2020-12-29 | Payer: MEDICAID | Attending: Geriatric Medicine

## 2020-12-13 ENCOUNTER — Encounter: Admit: 2020-12-13 | Payer: MEDICAID | Attending: Geriatric Medicine

## 2020-12-14 ENCOUNTER — Ambulatory Visit: Admit: 2020-12-14 | Discharge: 2020-12-15 | Attending: Radiation Oncology | Primary: Radiation Oncology

## 2020-12-15 ENCOUNTER — Ambulatory Visit: Admit: 2020-12-15 | Discharge: 2020-12-16 | Attending: Radiation Oncology | Primary: Radiation Oncology

## 2020-12-16 ENCOUNTER — Ambulatory Visit: Admit: 2020-12-16 | Discharge: 2020-12-17

## 2020-12-19 ENCOUNTER — Ambulatory Visit: Admit: 2020-12-19 | Discharge: 2020-12-20

## 2020-12-20 ENCOUNTER — Ambulatory Visit: Admit: 2020-12-20 | Discharge: 2020-12-21

## 2020-12-21 ENCOUNTER — Ambulatory Visit: Admit: 2020-12-21 | Discharge: 2020-12-22

## 2020-12-23 ENCOUNTER — Ambulatory Visit: Admit: 2020-12-23 | Discharge: 2020-12-24

## 2020-12-26 ENCOUNTER — Ambulatory Visit: Admit: 2020-12-26 | Discharge: 2020-12-27

## 2020-12-27 ENCOUNTER — Ambulatory Visit: Admit: 2020-12-27 | Discharge: 2020-12-28

## 2020-12-28 ENCOUNTER — Ambulatory Visit: Admit: 2020-12-28 | Discharge: 2020-12-29

## 2020-12-29 ENCOUNTER — Encounter: Admit: 2020-12-29 | Discharge: 2021-01-17

## 2020-12-29 ENCOUNTER — Encounter: Admit: 2020-12-29

## 2020-12-29 ENCOUNTER — Inpatient Hospital Stay: Admit: 2020-12-29

## 2020-12-29 MED ORDER — BISACODYL 10 MG RECTAL SUPPOSITORY
Freq: Every day | RECTAL | 0 refills | 2.00000 days | Status: CP | PRN
Start: 2020-12-29 — End: 2020-12-29
  Filled 2020-12-29: qty 5, 5d supply, fill #0
  Filled 2020-12-29: qty 30, 30d supply, fill #0

## 2020-12-29 MED ORDER — DIPHENHYDRAMINE 25 MG CAPSULE
ORAL_CAPSULE | Freq: Four times a day (QID) | ORAL | 0 refills | 5.00000 days | Status: CN | PRN
Start: 2020-12-29 — End: 2021-01-03
  Filled 2020-12-29: qty 100, 25d supply, fill #0

## 2020-12-29 MED ORDER — ONDANSETRON 4 MG DISINTEGRATING TABLET
ORAL_TABLET | Freq: Three times a day (TID) | ORAL | 0 refills | 5.00000 days | Status: CN | PRN
Start: 2020-12-29 — End: 2021-01-03

## 2020-12-29 MED ORDER — QUETIAPINE 25 MG TABLET
ORAL_TABLET | Freq: Every evening | ORAL | 0 refills | 6 days | Status: CN
Start: 2020-12-29 — End: 2021-01-03
  Filled 2020-12-29: qty 15, 30d supply, fill #0

## 2020-12-29 MED ORDER — BLOOD-GLUCOSE METER
0 refills | 0 days | Status: CP
Start: 2020-12-29 — End: ?
  Filled 2020-12-29: qty 1, 30d supply, fill #0

## 2020-12-29 MED ORDER — INSULIN GLARGINE (U-100) 100 UNIT/ML SUBCUTANEOUS SOLUTION
Freq: Every evening | SUBCUTANEOUS | 0 refills | 30 days | Status: CN
Start: 2020-12-29 — End: 2021-01-28

## 2020-12-29 MED ORDER — METOCLOPRAMIDE 5 MG/5 ML ORAL SOLUTION
Freq: Four times a day (QID) | ORAL | 0 refills | 5.00000 days | Status: CP | PRN
Start: 2020-12-29 — End: 2021-01-03
  Filled 2020-12-29: qty 200, 5d supply, fill #0

## 2020-12-29 MED ORDER — FENTANYL 100 MCG/HR TRANSDERMAL PATCH
MEDICATED_PATCH | TRANSDERMAL | 0 refills | 8.00000 days | Status: CP
Start: 2020-12-29 — End: 2021-01-03
  Filled 2020-12-29: qty 8, 8d supply, fill #0

## 2020-12-29 MED ORDER — DEXAMETHASONE 2 MG TABLET
ORAL_TABLET | ORAL | 0 refills | 20 days | Status: CP
Start: 2020-12-29 — End: 2021-01-18
  Filled 2020-12-29: qty 64, 20d supply, fill #0

## 2020-12-29 MED ORDER — LIDOCAINE 5 % TOPICAL PATCH
MEDICATED_PATCH | TRANSDERMAL | 0 refills | 30.00000 days | Status: CP
Start: 2020-12-29 — End: 2020-12-29
  Filled 2020-12-29: qty 5, 5d supply, fill #0

## 2020-12-29 MED ORDER — LANCING DEVICE
0 refills | 0 days
Start: 2020-12-29 — End: ?

## 2020-12-29 MED ORDER — HYDROMORPHONE 50 MG/50 ML (1 MG/ML) IN 0.9 % SOD.CHLORIDE IV PUMP RESV
INTRAVENOUS | 0 refills | 7 days | Status: CN
Start: 2020-12-29 — End: 2021-01-05

## 2020-12-29 MED ORDER — LORAZEPAM 1 MG TABLET
ORAL_TABLET | Freq: Four times a day (QID) | ORAL | 0 refills | 8 days | Status: CP | PRN
Start: 2020-12-29 — End: ?
  Filled 2020-12-29: qty 30, 8d supply, fill #0

## 2020-12-29 MED ORDER — INDOMETHACIN 50 MG CAPSULE
ORAL_CAPSULE | Freq: Every evening | ORAL | 2 refills | 30.00000 days | Status: CP
Start: 2020-12-29 — End: 2021-03-29
  Filled 2020-12-29: qty 90, 30d supply, fill #0

## 2020-12-29 MED ORDER — GLUCOSE 4 GRAM CHEWABLE TABLET
ORAL_TABLET | ORAL | 12 refills | 1.00000 days | Status: CP | PRN
Start: 2020-12-29 — End: 2021-01-03
  Filled 2020-12-29: qty 20, 1d supply, fill #0

## 2020-12-29 MED ORDER — PROCHLORPERAZINE MALEATE 10 MG TABLET
ORAL_TABLET | Freq: Four times a day (QID) | ORAL | 0 refills | 5.00000 days | Status: CP | PRN
Start: 2020-12-29 — End: 2021-01-03
  Filled 2020-12-29: qty 20, 5d supply, fill #0

## 2020-12-29 MED ORDER — SENNOSIDES 8.6 MG TABLET
ORAL_TABLET | Freq: Every evening | ORAL | 0 refills | 30.00000 days | Status: CP
Start: 2020-12-29 — End: 2021-01-28
  Filled 2020-12-29: qty 60, 30d supply, fill #0

## 2020-12-29 MED ORDER — POLYETHYLENE GLYCOL 3350 17 GRAM ORAL POWDER PACKET
PACK | Freq: Every day | ORAL | 0 refills | 30.00000 days | Status: CP
Start: 2020-12-29 — End: 2021-01-28

## 2020-12-29 MED ORDER — ACETAMINOPHEN 500 MG TABLET
ORAL_TABLET | Freq: Three times a day (TID) | ORAL | 0 refills | 5 days | Status: CN
Start: 2020-12-29 — End: ?

## 2020-12-29 MED ORDER — SERTRALINE 100 MG TABLET
ORAL_TABLET | Freq: Every day | ORAL | 0 refills | 5.00000 days | Status: CP
Start: 2020-12-29 — End: 2021-01-03
  Filled 2020-12-29: qty 10, 5d supply, fill #0

## 2020-12-29 MED ORDER — LANCETS 30 GAUGE
0 refills | 5.00000 days | Status: CN
Start: 2020-12-29 — End: 2021-01-03
  Filled 2020-12-29: qty 100, 20d supply, fill #0

## 2020-12-29 MED ORDER — GLUCAGON 1 MG SOLUTION FOR INJECTION (COMBINED VIALS) WRAPPER
Freq: Once | INTRAMUSCULAR | 0 refills | 14 days | Status: CN | PRN
Start: 2020-12-29 — End: 2021-01-12

## 2020-12-29 MED ORDER — PANTOPRAZOLE 40 MG TABLET,DELAYED RELEASE
ORAL_TABLET | Freq: Every day | ORAL | 0 refills | 5.00000 days | Status: CN
Start: 2020-12-29 — End: 2021-01-03
  Filled 2020-12-29: qty 30, 30d supply, fill #0

## 2020-12-29 MED ORDER — ON CALL EXPRESS TEST STRIP
ORAL_STRIP | 0 refills | 0 days | Status: CP
Start: 2020-12-29 — End: ?
  Filled 2020-12-29: qty 100, 20d supply, fill #0

## 2020-12-29 MED ORDER — INSULIN GLARGINE (U-100) 100 UNIT/ML (3 ML) SUBCUTANEOUS PEN
Freq: Every day | SUBCUTANEOUS | 0 refills | 5.00000 days | Status: CN
Start: 2020-12-29 — End: 2021-01-03
  Filled 2020-12-29: qty 15, 93d supply, fill #0

## 2020-12-29 MED ORDER — BAQSIMI 3 MG/ACTUATION NASAL SPRAY
0 refills | 0 days | Status: CP
Start: 2020-12-29 — End: ?
  Filled 2020-12-29: qty 1, 1d supply, fill #0

## 2020-12-29 MED ORDER — PEN NEEDLE, DIABETIC 32 GAUGE X 5/32" (4 MM)
0 refills | 0 days | Status: CP
Start: 2020-12-29 — End: ?

## 2020-12-29 MED ORDER — DIVALPROEX ER 500 MG TABLET,EXTENDED RELEASE 24 HR
ORAL_TABLET | Freq: Every day | ORAL | 0 refills | 5 days | Status: CN
Start: 2020-12-29 — End: 2021-01-03
  Filled 2020-12-29: qty 60, 30d supply, fill #0

## 2020-12-29 MED ORDER — BENZONATATE 100 MG CAPSULE
ORAL_CAPSULE | Freq: Three times a day (TID) | ORAL | 0 refills | 5 days | Status: CN | PRN
Start: 2020-12-29 — End: 2021-01-03

## 2020-12-29 MED ORDER — HALOPERIDOL LACTATE 2 MG/ML ORAL CONCENTRATE
Freq: Four times a day (QID) | ORAL | 0 refills | 8.00000 days | Status: CP | PRN
Start: 2020-12-29 — End: 2021-01-28
  Filled 2020-12-29: qty 15, 8d supply, fill #0

## 2020-12-29 MED FILL — ON CALL LANCING DEVICE: 30 days supply | Qty: 1 | Fill #0

## 2020-12-29 MED FILL — ONDANSETRON 4 MG DISINTEGRATING TABLET: ORAL | 18 days supply | Qty: 90 | Fill #0

## 2020-12-29 MED FILL — UNIFINE PENTIPS 32 GAUGE X 5/32" NEEDLE (4 MM): 100 days supply | Qty: 100 | Fill #0

## 2020-12-31 MED ORDER — MORPHINE 30 MG IMMEDIATE RELEASE TABLET
ORAL_TABLET | ORAL | 0 refills | 15 days | Status: CP
Start: 2020-12-31 — End: ?

## 2021-01-01 MED ORDER — MORPHINE 30 MG IMMEDIATE RELEASE TABLET
ORAL_TABLET | ORAL | 0 refills | 17 days | Status: CP
Start: 2021-01-01 — End: ?

## 2021-01-01 MED ORDER — MORPHINE 15 MG IMMEDIATE RELEASE TABLET
ORAL_TABLET | ORAL | 0 refills | 5.00000 days | Status: CP
Start: 2021-01-01 — End: ?

## 2021-01-01 MED ORDER — METHADONE 5 MG TABLET
ORAL_TABLET | Freq: Three times a day (TID) | ORAL | 0 refills | 10 days | Status: CP
Start: 2021-01-01 — End: 2022-01-01

## 2021-01-03 DIAGNOSIS — C7951 Secondary malignant neoplasm of bone: Principal | ICD-10-CM

## 2021-01-03 MED ORDER — FENTANYL 100 MCG/HR TRANSDERMAL PATCH
MEDICATED_PATCH | TRANSDERMAL | 0 refills | 56 days | Status: CP
Start: 2021-01-03 — End: ?

## 2021-01-03 MED ORDER — MORPHINE 30 MG IMMEDIATE RELEASE TABLET
ORAL_TABLET | ORAL | 0 refills | 30.00000 days | Status: CP | PRN
Start: 2021-01-03 — End: ?

## 2021-01-09 MED ORDER — DIAZEPAM 5 MG TABLET
ORAL_TABLET | Freq: Four times a day (QID) | ORAL | 0 refills | 4 days | Status: CP | PRN
Start: 2021-01-09 — End: ?

## 2021-01-16 DIAGNOSIS — N3000 Acute cystitis without hematuria: Principal | ICD-10-CM

## 2021-01-16 MED ORDER — SULFAMETHOXAZOLE 800 MG-TRIMETHOPRIM 160 MG TABLET
ORAL_TABLET | Freq: Two times a day (BID) | ORAL | 0 refills | 5 days | Status: CP
Start: 2021-01-16 — End: 2021-01-21

## 2021-01-26 DEATH — deceased
